# Patient Record
Sex: Female | Born: 1975 | Race: Black or African American | Hispanic: No | Smoking: Never smoker
Health system: Southern US, Community
[De-identification: ages and names within clinical notes are randomized; demographics above are authoritative.]

## PROBLEM LIST (undated history)

## (undated) DIAGNOSIS — I213 ST elevation (STEMI) myocardial infarction of unspecified site: Secondary | ICD-10-CM

## (undated) DIAGNOSIS — D573 Sickle-cell trait: Secondary | ICD-10-CM

## (undated) DIAGNOSIS — I10 Essential (primary) hypertension: Secondary | ICD-10-CM

## (undated) DIAGNOSIS — D649 Anemia, unspecified: Secondary | ICD-10-CM

## (undated) DIAGNOSIS — R569 Unspecified convulsions: Secondary | ICD-10-CM

## (undated) DIAGNOSIS — K219 Gastro-esophageal reflux disease without esophagitis: Secondary | ICD-10-CM

## (undated) DIAGNOSIS — E1165 Type 2 diabetes mellitus with hyperglycemia: Principal | ICD-10-CM

## (undated) DIAGNOSIS — IMO0001 Reserved for inherently not codable concepts without codable children: Secondary | ICD-10-CM

## (undated) DIAGNOSIS — E785 Hyperlipidemia, unspecified: Secondary | ICD-10-CM

## (undated) DIAGNOSIS — C541 Malignant neoplasm of endometrium: Secondary | ICD-10-CM

## (undated) DIAGNOSIS — F419 Anxiety disorder, unspecified: Secondary | ICD-10-CM

## (undated) DIAGNOSIS — E669 Obesity, unspecified: Secondary | ICD-10-CM

## (undated) HISTORY — DX: Sickle-cell trait: D57.3

## (undated) HISTORY — DX: Reserved for inherently not codable concepts without codable children: IMO0001

## (undated) HISTORY — DX: Type 2 diabetes mellitus with hyperglycemia: E11.65

## (undated) HISTORY — DX: Hyperlipidemia, unspecified: E78.5

## (undated) HISTORY — DX: Obesity, unspecified: E66.9

## (undated) HISTORY — DX: Anemia, unspecified: D64.9

## (undated) HISTORY — DX: Anxiety disorder, unspecified: F41.9

## (undated) HISTORY — DX: Essential (primary) hypertension: I10

## (undated) HISTORY — DX: ST elevation (STEMI) myocardial infarction of unspecified site: I21.3

---

## 2006-04-09 ENCOUNTER — Ambulatory Visit: Payer: Self-pay | Admitting: Family Medicine

## 2006-05-14 ENCOUNTER — Ambulatory Visit: Payer: Self-pay | Admitting: Family Medicine

## 2006-05-21 ENCOUNTER — Ambulatory Visit: Payer: Self-pay | Admitting: Family Medicine

## 2006-05-24 ENCOUNTER — Encounter: Admission: RE | Admit: 2006-05-24 | Discharge: 2006-05-24 | Payer: Self-pay | Admitting: Family Medicine

## 2006-06-11 ENCOUNTER — Ambulatory Visit: Payer: Self-pay | Admitting: Family Medicine

## 2006-06-12 ENCOUNTER — Encounter: Admission: RE | Admit: 2006-06-12 | Discharge: 2006-09-10 | Payer: Self-pay | Admitting: Family Medicine

## 2006-06-27 ENCOUNTER — Ambulatory Visit: Payer: Self-pay | Admitting: Cardiovascular Disease

## 2006-07-12 ENCOUNTER — Ambulatory Visit: Payer: Self-pay | Admitting: Family Medicine

## 2007-02-20 ENCOUNTER — Ambulatory Visit: Payer: Self-pay | Admitting: Family Medicine

## 2007-02-20 LAB — CONVERTED CEMR LAB
BUN: 10 mg/dL (ref 6–23)
Basophils Relative: 0 % (ref 0–1)
Chloride: 102 meq/L (ref 96–112)
Cholesterol: 138 mg/dL (ref 0–200)
Creatinine, Ser: 0.57 mg/dL (ref 0.40–1.20)
Glucose, Bld: 128 mg/dL — ABNORMAL HIGH (ref 70–99)
HCT: 37.7 % (ref 36.0–46.0)
Hemoglobin: 12.8 g/dL (ref 12.0–15.0)
LDL Cholesterol: 78 mg/dL (ref 0–99)
MCV: 73.9 fL — ABNORMAL LOW (ref 78.0–100.0)
Monocytes Absolute: 0.4 10*3/uL (ref 0.1–1.0)
Neutrophils Relative %: 64 % (ref 43–77)
Platelets: 353 10*3/uL (ref 150–400)
Sodium: 139 meq/L (ref 135–145)
TSH: 3.138 microintl units/mL (ref 0.350–5.50)
Total CHOL/HDL Ratio: 2.9
Total Protein: 8.1 g/dL (ref 6.0–8.3)
Triglycerides: 63 mg/dL (ref <150)
VLDL: 13 mg/dL (ref 0–40)

## 2007-02-21 ENCOUNTER — Ambulatory Visit: Payer: Self-pay | Admitting: *Deleted

## 2007-03-05 ENCOUNTER — Ambulatory Visit: Payer: Self-pay | Admitting: Family Medicine

## 2007-03-06 ENCOUNTER — Encounter (INDEPENDENT_AMBULATORY_CARE_PROVIDER_SITE_OTHER): Payer: Self-pay | Admitting: Family Medicine

## 2007-03-06 LAB — CONVERTED CEMR LAB: TSH: 1.936 microintl units/mL (ref 0.350–5.50)

## 2007-04-23 ENCOUNTER — Ambulatory Visit: Payer: Self-pay | Admitting: Family Medicine

## 2007-05-09 ENCOUNTER — Ambulatory Visit (HOSPITAL_COMMUNITY): Admission: RE | Admit: 2007-05-09 | Discharge: 2007-05-09 | Payer: Self-pay | Admitting: Family Medicine

## 2007-11-06 ENCOUNTER — Emergency Department (HOSPITAL_COMMUNITY): Admission: EM | Admit: 2007-11-06 | Discharge: 2007-11-06 | Payer: Self-pay | Admitting: Emergency Medicine

## 2007-11-19 ENCOUNTER — Ambulatory Visit: Payer: Self-pay | Admitting: Internal Medicine

## 2007-11-19 LAB — CONVERTED CEMR LAB
ALT: 15 units/L (ref 0–35)
Albumin: 3.9 g/dL (ref 3.5–5.2)
BUN: 8 mg/dL (ref 6–23)
CO2: 28 meq/L (ref 19–32)
Cholesterol: 137 mg/dL (ref 0–200)
Creatinine, Ser: 0.57 mg/dL (ref 0.40–1.20)
Glucose, Bld: 153 mg/dL — ABNORMAL HIGH (ref 70–99)
HDL: 40 mg/dL (ref >39)
Potassium: 4.5 meq/L (ref 3.5–5.3)
Total Bilirubin: 0.7 mg/dL (ref 0.3–1.2)
Total CHOL/HDL Ratio: 3.4
Total Protein: 8.1 g/dL (ref 6.0–8.3)
Triglycerides: 86 mg/dL (ref <150)
VLDL: 17 mg/dL (ref 0–40)

## 2007-11-22 ENCOUNTER — Ambulatory Visit: Payer: Self-pay | Admitting: Internal Medicine

## 2007-12-05 ENCOUNTER — Emergency Department (HOSPITAL_COMMUNITY): Admission: EM | Admit: 2007-12-05 | Discharge: 2007-12-05 | Payer: Self-pay | Admitting: Emergency Medicine

## 2007-12-10 ENCOUNTER — Ambulatory Visit: Payer: Self-pay | Admitting: Internal Medicine

## 2008-05-03 ENCOUNTER — Emergency Department (HOSPITAL_COMMUNITY): Admission: EM | Admit: 2008-05-03 | Discharge: 2008-05-04 | Payer: Self-pay | Admitting: Emergency Medicine

## 2008-05-04 ENCOUNTER — Emergency Department (HOSPITAL_COMMUNITY): Admission: EM | Admit: 2008-05-04 | Discharge: 2008-05-04 | Payer: Self-pay | Admitting: Emergency Medicine

## 2008-05-27 ENCOUNTER — Ambulatory Visit: Payer: Self-pay | Admitting: Family Medicine

## 2008-05-27 ENCOUNTER — Encounter (INDEPENDENT_AMBULATORY_CARE_PROVIDER_SITE_OTHER): Payer: Self-pay | Admitting: Internal Medicine

## 2008-05-27 LAB — CONVERTED CEMR LAB: Phenytoin Lvl: 0.9 ug/mL — ABNORMAL LOW (ref 10.0–20.0)

## 2008-05-28 ENCOUNTER — Encounter (INDEPENDENT_AMBULATORY_CARE_PROVIDER_SITE_OTHER): Payer: Self-pay | Admitting: Internal Medicine

## 2008-06-16 ENCOUNTER — Ambulatory Visit: Payer: Self-pay | Admitting: Internal Medicine

## 2008-06-16 ENCOUNTER — Encounter (INDEPENDENT_AMBULATORY_CARE_PROVIDER_SITE_OTHER): Payer: Self-pay | Admitting: Internal Medicine

## 2008-06-16 LAB — CONVERTED CEMR LAB: Phenytoin Lvl: 1 ug/mL — ABNORMAL LOW (ref 10.0–20.0)

## 2008-06-17 ENCOUNTER — Encounter (INDEPENDENT_AMBULATORY_CARE_PROVIDER_SITE_OTHER): Payer: Self-pay | Admitting: Internal Medicine

## 2008-06-30 ENCOUNTER — Ambulatory Visit: Payer: Self-pay | Admitting: Internal Medicine

## 2008-07-27 ENCOUNTER — Ambulatory Visit: Payer: Self-pay | Admitting: Internal Medicine

## 2008-08-17 ENCOUNTER — Emergency Department (HOSPITAL_COMMUNITY): Admission: EM | Admit: 2008-08-17 | Discharge: 2008-08-18 | Payer: Self-pay | Admitting: Emergency Medicine

## 2008-08-17 ENCOUNTER — Ambulatory Visit: Payer: Self-pay | Admitting: Internal Medicine

## 2008-08-21 ENCOUNTER — Ambulatory Visit: Payer: Self-pay | Admitting: Internal Medicine

## 2008-09-14 ENCOUNTER — Inpatient Hospital Stay (HOSPITAL_COMMUNITY): Admission: EM | Admit: 2008-09-14 | Discharge: 2008-09-18 | Payer: Self-pay | Admitting: Emergency Medicine

## 2008-09-15 ENCOUNTER — Encounter: Payer: Self-pay | Admitting: Neurology

## 2008-09-22 ENCOUNTER — Ambulatory Visit: Payer: Self-pay | Admitting: Internal Medicine

## 2008-10-19 DIAGNOSIS — F329 Major depressive disorder, single episode, unspecified: Secondary | ICD-10-CM

## 2008-10-19 DIAGNOSIS — E119 Type 2 diabetes mellitus without complications: Secondary | ICD-10-CM | POA: Insufficient documentation

## 2008-10-19 DIAGNOSIS — R569 Unspecified convulsions: Secondary | ICD-10-CM | POA: Insufficient documentation

## 2008-11-06 ENCOUNTER — Encounter (INDEPENDENT_AMBULATORY_CARE_PROVIDER_SITE_OTHER): Payer: Self-pay | Admitting: Internal Medicine

## 2008-11-06 ENCOUNTER — Ambulatory Visit: Payer: Self-pay | Admitting: Internal Medicine

## 2008-11-06 LAB — CONVERTED CEMR LAB
Basophils Relative: 1 % (ref 0–1)
Eosinophils Absolute: 0.3 10*3/uL (ref 0.0–0.7)
Lymphocytes Relative: 23 % (ref 12–46)
Lymphs Abs: 2 10*3/uL (ref 0.7–4.0)
MCHC: 34.7 g/dL (ref 30.0–36.0)
Monocytes Absolute: 0.5 10*3/uL (ref 0.1–1.0)
RDW: 14.1 % (ref 11.5–15.5)

## 2009-01-12 ENCOUNTER — Encounter (INDEPENDENT_AMBULATORY_CARE_PROVIDER_SITE_OTHER): Payer: Self-pay | Admitting: *Deleted

## 2009-01-24 ENCOUNTER — Emergency Department (HOSPITAL_COMMUNITY): Admission: EM | Admit: 2009-01-24 | Discharge: 2009-01-24 | Payer: Self-pay | Admitting: Emergency Medicine

## 2009-10-29 ENCOUNTER — Encounter: Admission: RE | Admit: 2009-10-29 | Discharge: 2009-10-29 | Payer: Self-pay | Admitting: Family Medicine

## 2010-02-17 ENCOUNTER — Emergency Department (HOSPITAL_COMMUNITY): Admission: EM | Admit: 2010-02-17 | Discharge: 2009-03-26 | Payer: Self-pay | Admitting: Emergency Medicine

## 2010-05-29 LAB — URINALYSIS, ROUTINE W REFLEX MICROSCOPIC
Bilirubin Urine: NEGATIVE
Leukocytes, UA: NEGATIVE
Protein, ur: NEGATIVE mg/dL
pH: 6 (ref 5.0–8.0)

## 2010-05-29 LAB — URINE MICROSCOPIC-ADD ON

## 2010-05-29 LAB — POCT I-STAT, CHEM 8
BUN: 6 mg/dL (ref 6–23)
Chloride: 104 mEq/L (ref 96–112)
HCT: 39 % (ref 36.0–46.0)

## 2010-05-29 LAB — CARBAMAZEPINE LEVEL, TOTAL: Carbamazepine Lvl: <2 ug/mL — ABNORMAL LOW (ref 4.0–12.0)

## 2010-06-15 LAB — GLUCOSE, CAPILLARY: Glucose-Capillary: 178 mg/dL — ABNORMAL HIGH (ref 70–99)

## 2010-06-15 LAB — DIFFERENTIAL
Basophils Absolute: 0 10*3/uL (ref 0.0–0.1)
Eosinophils Relative: 2 % (ref 0–5)
Lymphs Abs: 2.5 10*3/uL (ref 0.7–4.0)
Monocytes Absolute: 0.4 10*3/uL (ref 0.1–1.0)
Monocytes Relative: 3 % (ref 3–12)
Neutro Abs: 10.2 10*3/uL — ABNORMAL HIGH (ref 1.7–7.7)

## 2010-06-15 LAB — CBC
Hemoglobin: 13.3 g/dL (ref 12.0–15.0)
MCHC: 33.5 g/dL (ref 30.0–36.0)
MCV: 78.9 fL (ref 78.0–100.0)
Platelets: 374 10*3/uL (ref 150–400)
RDW: 14.8 % (ref 11.5–15.5)
WBC: 13.3 10*3/uL — ABNORMAL HIGH (ref 4.0–10.5)

## 2010-06-15 LAB — URINE CULTURE

## 2010-06-15 LAB — POCT I-STAT, CHEM 8
BUN: 8 mg/dL (ref 6–23)
Calcium, Ion: 1.12 mmol/L (ref 1.12–1.32)
Creatinine, Ser: 0.6 mg/dL (ref 0.4–1.2)
HCT: 42 % (ref 36.0–46.0)
Hemoglobin: 14.3 g/dL (ref 12.0–15.0)
Potassium: 3.6 mEq/L (ref 3.5–5.1)
TCO2: 27 mmol/L (ref 0–100)

## 2010-06-15 LAB — URINALYSIS, ROUTINE W REFLEX MICROSCOPIC
Bilirubin Urine: NEGATIVE
Urobilinogen, UA: 1 mg/dL (ref 0.0–1.0)

## 2010-06-15 LAB — URINE MICROSCOPIC-ADD ON

## 2010-06-19 LAB — GLUCOSE, CAPILLARY
Glucose-Capillary: 132 mg/dL — ABNORMAL HIGH (ref 70–99)
Glucose-Capillary: 165 mg/dL — ABNORMAL HIGH (ref 70–99)
Glucose-Capillary: 172 mg/dL — ABNORMAL HIGH (ref 70–99)
Glucose-Capillary: 182 mg/dL — ABNORMAL HIGH (ref 70–99)
Glucose-Capillary: 188 mg/dL — ABNORMAL HIGH (ref 70–99)
Glucose-Capillary: 74 mg/dL (ref 70–99)

## 2010-06-19 LAB — DIFFERENTIAL
Eosinophils Absolute: 0.3 10*3/uL (ref 0.0–0.7)
Eosinophils Relative: 3 % (ref 0–5)
Lymphocytes Relative: 24 % (ref 12–46)
Lymphs Abs: 2.3 10*3/uL (ref 0.7–4.0)
Monocytes Absolute: 0.9 10*3/uL (ref 0.1–1.0)

## 2010-06-19 LAB — COMPREHENSIVE METABOLIC PANEL
ALT: 10 U/L (ref 0–35)
ALT: 10 U/L (ref 0–35)
ALT: 11 U/L (ref 0–35)
AST: 11 U/L (ref 0–37)
AST: 15 U/L (ref 0–37)
Albumin: 3 g/dL — ABNORMAL LOW (ref 3.5–5.2)
Alkaline Phosphatase: 106 U/L (ref 39–117)
Alkaline Phosphatase: 122 U/L — ABNORMAL HIGH (ref 39–117)
BUN: 6 mg/dL (ref 6–23)
CO2: 28 mEq/L (ref 19–32)
CO2: 28 mEq/L (ref 19–32)
CO2: 30 mEq/L (ref 19–32)
Chloride: 104 mEq/L (ref 96–112)
Chloride: 99 mEq/L (ref 96–112)
Creatinine, Ser: 0.69 mg/dL (ref 0.4–1.2)
GFR calc Af Amer: >60 mL/min (ref >60)
GFR calc non Af Amer: >60 mL/min (ref >60)
GFR calc non Af Amer: >60 mL/min (ref >60)
GFR calc non Af Amer: >60 mL/min (ref >60)
Glucose, Bld: 118 mg/dL — ABNORMAL HIGH (ref 70–99)
Glucose, Bld: 244 mg/dL — ABNORMAL HIGH (ref 70–99)
Potassium: 3.8 mEq/L (ref 3.5–5.1)
Potassium: 4 mEq/L (ref 3.5–5.1)
Sodium: 133 mEq/L — ABNORMAL LOW (ref 135–145)
Sodium: 138 mEq/L (ref 135–145)
Sodium: 139 mEq/L (ref 135–145)
Total Bilirubin: 0.3 mg/dL (ref 0.3–1.2)
Total Bilirubin: 0.4 mg/dL (ref 0.3–1.2)
Total Protein: 7.1 g/dL (ref 6.0–8.3)
Total Protein: 7.1 g/dL (ref 6.0–8.3)

## 2010-06-19 LAB — CBC
HCT: 39.4 % (ref 36.0–46.0)
Hemoglobin: 12.6 g/dL (ref 12.0–15.0)
Hemoglobin: 13.2 g/dL (ref 12.0–15.0)
MCV: 80.6 fL (ref 78.0–100.0)
Platelets: 301 10*3/uL (ref 150–400)
RBC: 4.68 MIL/uL (ref 3.87–5.11)
RBC: 4.69 MIL/uL (ref 3.87–5.11)
RBC: 4.83 MIL/uL (ref 3.87–5.11)
RDW: 17.8 % — ABNORMAL HIGH (ref 11.5–15.5)
WBC: 10.5 10*3/uL (ref 4.0–10.5)
WBC: 9.6 10*3/uL (ref 4.0–10.5)

## 2010-06-19 LAB — VALPROIC ACID LEVEL
Valproic Acid Lvl: 121.4 ug/mL — ABNORMAL HIGH (ref 50.0–100.0)
Valproic Acid Lvl: 50.3 ug/mL (ref 50.0–100.0)
Valproic Acid Lvl: 58.9 ug/mL (ref 50.0–100.0)

## 2010-06-19 LAB — RAPID URINE DRUG SCREEN, HOSP PERFORMED
Amphetamines: NOT DETECTED
Benzodiazepines: NOT DETECTED
Tetrahydrocannabinol: NOT DETECTED

## 2010-06-19 LAB — CARBAMAZEPINE, FREE AND TOTAL
Carbamazepine, Free: 0.9 ug/mL — ABNORMAL LOW (ref 1.0–3.0)
Carbamazepine, Free: 2.5 ug/mL (ref 1.0–3.0)
Carbamazepine, Percent Free: 30.2 % (ref 8.0–35.0)
Carbamazepine, Total: 6.3 ug/mL (ref 4.0–12.0)

## 2010-06-19 LAB — CARBAMAZEPINE LEVEL, TOTAL: Carbamazepine Lvl: 7.6 ug/mL (ref 4.0–12.0)

## 2010-06-20 LAB — BASIC METABOLIC PANEL
Chloride: 105 mEq/L (ref 96–112)
GFR calc non Af Amer: >60 mL/min (ref >60)
Potassium: 4.3 mEq/L (ref 3.5–5.1)
Sodium: 142 mEq/L (ref 135–145)

## 2010-06-20 LAB — ETHANOL: Alcohol, Ethyl (B): <5 mg/dL (ref 0–10)

## 2010-06-20 LAB — RAPID URINE DRUG SCREEN, HOSP PERFORMED
Barbiturates: NOT DETECTED
Benzodiazepines: NOT DETECTED
Cocaine: NOT DETECTED
Opiates: NOT DETECTED

## 2010-06-20 LAB — PHENYTOIN LEVEL, TOTAL: Phenytoin Lvl: 6.7 ug/mL — ABNORMAL LOW (ref 10.0–20.0)

## 2010-06-28 LAB — DIFFERENTIAL
Eosinophils Relative: 1 % (ref 0–5)
Lymphocytes Relative: 22 % (ref 12–46)
Lymphs Abs: 2.7 10*3/uL (ref 0.7–4.0)
Monocytes Relative: 4 % (ref 3–12)

## 2010-06-28 LAB — URINALYSIS, ROUTINE W REFLEX MICROSCOPIC
Nitrite: NEGATIVE
Specific Gravity, Urine: 1.014 (ref 1.005–1.030)
Urobilinogen, UA: 0.2 mg/dL (ref 0.0–1.0)

## 2010-06-28 LAB — COMPREHENSIVE METABOLIC PANEL
ALT: 14 U/L (ref 0–35)
Calcium: 8.9 mg/dL (ref 8.4–10.5)
GFR calc Af Amer: >60 mL/min (ref >60)
Glucose, Bld: 125 mg/dL — ABNORMAL HIGH (ref 70–99)
Sodium: 138 mEq/L (ref 135–145)
Total Protein: 7.5 g/dL (ref 6.0–8.3)

## 2010-06-28 LAB — CBC
Hemoglobin: 11.8 g/dL — ABNORMAL LOW (ref 12.0–15.0)
MCHC: 33 g/dL (ref 30.0–36.0)
RDW: 15.9 % — ABNORMAL HIGH (ref 11.5–15.5)

## 2010-06-28 LAB — URINE MICROSCOPIC-ADD ON

## 2010-06-28 LAB — PHENYTOIN LEVEL, TOTAL: Phenytoin Lvl: <2.5 ug/mL — ABNORMAL LOW (ref 10.0–20.0)

## 2010-07-19 ENCOUNTER — Inpatient Hospital Stay (INDEPENDENT_AMBULATORY_CARE_PROVIDER_SITE_OTHER)
Admission: RE | Admit: 2010-07-19 | Discharge: 2010-07-19 | Disposition: A | Discharge status: Home / Self Care | Payer: Self-pay | Source: Ambulatory Visit | Attending: Family Medicine | Admitting: Family Medicine

## 2010-07-19 DIAGNOSIS — S058X9A Other injuries of unspecified eye and orbit, initial encounter: Secondary | ICD-10-CM

## 2010-07-26 NOTE — H&P (Signed)
NAMEMarland Kitchen  CORLISS, COGGESHALL NO.:  [id number]   MEDICAL RECORD NO.:  [id number]          PATIENT TYPE:  INP   LOCATION:  1437                         FACILITY:  [facility]   PHYSICIAN:  Noel Christmas, MD    DATE OF BIRTH:  September 21, 1975   DATE OF ADMISSION:  09/14/2008  DATE OF DISCHARGE:                              HISTORY & PHYSICAL   CHIEF COMPLAINT:  Recurrent grand mal seizures.   HISTORY OF PRESENT ILLNESS:  This is a 35 year old lady with a history  of chronic grand mal seizures who presented with a history of three  witnessed grand mal seizures today.  One of which lasted about 15  minutes.  Postictal state is described following  each her spells.  She  has had 6 generalized seizures over the past week.  Overall seizure  frequency has been at least several month.  She was in the emergency  room here on August 17, 2008, following seizure activity.  At that time she  was being weaned from Dilantin and titrated on Depakote.  She was  switched from Dilantin to Depakote because of continuous seizures on  Dilantin.  Unclear whether the Dilantin level was therapeutic.  She is  currently taking 500 mg of Depakote in the morning and 1000 mg at  bedtime.  The Depakote level on this presentation was 50.3 (normal  equals 50.0 to 100.0).  The patient has been afebrile.  Electrolytes  were unremarkable.  Blood counts were mostly unremarkable.   PAST MEDICAL HISTORY:  1. Is remarkable for diabetes mellitus.  2. Seizure disorder.  3. Depression.  4. Marked obesity.   CURRENT MEDICATIONS:  1. Metformin 500 mg t.i.d.  2. Glucotrol 5 mg per day.  3. Depakote 500 mg q.a.m. and 1000 mg nightly.  4. Celexa 20 mg per day.  5. Lexapro 20 mg per day.   FAMILY HISTORY:  Noncontributory.   ALLERGIES:  No known drug allergies except FLAGYL.   SOCIAL HISTORY:  The patient does not use tobacco products nor does she  drink alcohol.  There is no history of illicit drug use.   REVIEW OF  SYSTEMS:  The review of systems was negative except for the  patient's presenting complaint.   EXAMINATION:  VITAL SIGNS:  Temperature is 98.3, pulse 94, respirations  22 per minute, blood pressure 117/77, oxygen saturation is 96% on room  air.  GENERAL:  The appearance was that of a markedly obese young lady who was  alert, cooperative and in no acute distress.  Her mental status was  normal.  HEENT:  Were normal.  NECK:  Supple with no masses or tenderness.  CHEST:  Was clear to auscultation.  CARDIAC:  Rate and rhythm were normal.  Heart sounds were normal.  ABDOMEN:  Was soft and nontender.  The bowel sounds were normal.  EXTREMITIES:  Normal.  PERIPHERAL PULSES:  Were normal.  EXAMINATION OF GENITALIA AND RECTUM:  Deferred.  NEUROLOGICAL EXAM:  Pupils were equal and reactive only to light.  Extraocular movements and visual fields were normal.  There was no  facial weakness.  Hearing was normal.  Speech amount and palatal  movement were normal.  Strength and muscle tone were normal throughout.  Deep tendon reflexes were 1+ in the upper extremities and absent in the  lower extremities except 1+ left knee jerk area.  Plantar responses were  flexor.  Sensory examination was normal.  Coordination was normal.  Carotid auscultation was normal.   LABORATORY STUDIES:  WBC count was 9.6, hemoglobin 12.5, hematocrit  37.8, and platelet count was 301,000.  Serum sodium was 139, potassium  3.8, chloride 104, carbon dioxide 28, glucose 119, BUN 7, creatinine  0.69 and serum calcium was 8.8.  Urine drug screen was negative.  Pregnancy test was negative.   CLINICAL IMPRESSION:  1. Recurrent generalized seizure, most likely secondary to inadequate      anticonvulsive medication dosing.  2. 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  3. Marked obesity.   PLAN:  1. Depakote IV bolus, 500 mg.  2. Will increase maintenance Depakote to 1000 mg q.a.m. and nightly.  3. Repeat Depakote level in the  a.m.  4. I would recommend adding a second anticonvulsant medication if      seizure activity recurs and Depakote level is that or near upper      limits of normal.  5. Will admit overnight to telemetry bed.      Noel Christmas, MD  Electronically Signed     Noel Christmas, MD  Electronically Signed    CS/MEDQ  D:  09/14/2008  T:  09/14/2008  Job:  [id number]

## 2010-07-26 NOTE — Procedures (Signed)
EEG:  10 - 078.   CLINICAL HISTORY:  The patient is a 35 year old woman evaluated for the  presence of seizures versus nonepileptic seizures (780.39).   PROCEDURE:  The tracing is carried out on a 32-channel digital Cadwell  recorder reformatted into 16 channel montages with one devoted to EKG.  The patient was awake and asleep during the recording.  A total of 18-  hour 18.3 hours was recorded from 3:44 p.m. on July 7 to 10:05 a.m. on  July 8.   MEDICATIONS:  Include Tegretol, Lexapro, Glucotrol, Keppra, Glucophage,  Tylenol, and Ativan.   DESCRIPTION OF FINDINGS:  The dominant frequency is an 8 Hz 30 microvolt  activity that is well regulated.  Background activity shows mixed  frequency, alpha upper theta range activity, and frontally predominant  beta range components.   During the record, there were numerous episodes of seizure-like  behavior.  These will be described below.  All but 2 were associated  with push buttons.  The others were clear from evaluating the entire  record.  The patient was having seizure-like behavior.   The first occurred at 5:50-5:58 pm.  This is associated with rocking of  her head, flexing of her head and neck, moving her body side-to-side.  During this time, there is considerable muscle artifact particularly  when she girded her teeth.  Alpha range activity could be seen  underneath this.  Upon cessation of the behavior, alpha range activity  immediately returned.   The second episode happened between 6:50 and 6:54 p.m.  The patient  began by rubbing her left leg and sat up.  She seemed to be  unresponsive.  She then began rocking from side-to-side.  She then had  flailing behavior as she rocked back and forth from side to side.  Alpha  range activity could be seen from time to time and amidst significant  muscle artifact.   The next episode occurred between 9:52 and 9:57 p.m.  The patient was  eating and suddenly pushed her cart away.  She was  sitting up  unresponsive.  A man in the room lowered her bed down to flat.  She  seemed unresponsive.  She then began rolling from side-to-side and then  thrashing.  She also began grinding her teeth which caused significant  muscle artifact.  This too was associated with predominantly artifact  with some alpha range activity evident.   From 10:03 to 10:09 PM, the patient again had head rocking, pelvic  thrusting, and movement of her head from side-to-side.  The behavior  that was present was very similar to that seen described above.   At 3:35 in the morning, the patient had an episode that began with her  sitting up.  She had been asleep on 2 occasions from 9-9:50, 12:30-  01:46.  She has been resting quietly and then sat up.  At this time, the  background showed a rhythmic 4 Hz delta range activity.  It was broadly  distributed.  Once the lights returned on, it was apparent that the  patient was on her abdomen flexing and extending her trunk back and  forth.  This lasted from 3:35-3:39 and again as soon as the rocking  stopped, alpha range activity could be seen.   The final episode occurred around 9:30 a.m.  This lasted for about 4  minutes and again was associated with nodding of her head and rocking  back and forth.  She had 1 other episode between  5:55 and 5:59 that was  associated with flexing and extending her leg and moving her head from  side to side.  In both these cases, alpha range activity could be seen  in the background and then became much more prominent as soon as she  stopped the movement.   IMPRESSION:  The electroencephalogram shows a normal waking background  and also normal light natural sleep.  During the several episodes  described above, a variety of behaviors were seen all of them in  different sequences and form.  All of them were associated with muscle  artifact with alpha range activity that could either be seen during the  time of the behavior or  immediately in the aftermath as it stopped.  There was no interictal epileptiform activity in the form of spikes or  sharp waves.  There were no electrographic seizures.   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      Deanna Artis. Sharene Skeans, M.D.  Electronically Signed     ZOX:WRUE  D:  09/17/2008 20:19:10  T:  09/18/2008 04:13:16  Job #:  [id number]   cc:   Levert Feinstein, MD
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
## 2010-07-26 NOTE — Discharge Summary (Signed)
Kelly [name], Kelly [name]      ACCOUNT NO.:  [id number]   MEDICAL RECORD NO.:  [id number]          PATIENT TYPE:  INP   LOCATION:  3003                         FACILITY:  [facility]   PHYSICIAN:  Levert Feinstein, MD          DATE OF BIRTH:  [birth year]   DATE OF ADMISSION:  09/14/2008  DATE OF DISCHARGE:  09/18/2008                               DISCHARGE SUMMARY   DISCHARGE DIAGNOSIS:  Pseudoseizure.   CURRENT DIAGNOSIS:  Pseudoseizure.   OTHER DIAGNOSES:  1. Type 2 diabetes.  2. Morbid obesity.  3. Depression.   DISCHARGE MEDICATIONS:  1. Tegretol 200 mg twice a day.  2. Celexa 10 mg once every day.  3. Metformin 500 t.i.d.  4. Tylenol p.r.n.  5. Glipizide 5 mg p.o. daily.   HOSPITAL COURSE:  The patient is a 35 year old African American female,  admitted to [facility] ER on September 14, 2008 for recurrent seizure.  Initially, the patient and family described general tonic-clonic  seizure.  She had six seizures over the past week.  On the day of  admission, she had four.   The patient and family reported a history of seizure since 2000, was  initially treated with Dilantin with frequent occurrence over the past  few months, has weaned off Dilantin and titrated up on Depakote in the  past 1 month.  Upon admission, she was taking 500 in the morning and  1000 at evening time with level of 50.3 upon admission.  However, upon  further questioning, the patient reported a history of strong unpleasant  odor, seeing flashing light, proceeding almost each seizure, complex  partial seizure diagnosis was entertained.   During her hospital stay, she had frequent occurrence, up to 8 episodes  in a day.  There was also description of aversion to ammonia capsule,  arching back, and the lack of post-ictal confusion.  There was  difference semiology, such as body stiffness, mourning, repetitive  sentence during her seizure activity.  During multiple documented  episodes, her vitals were stable.   Pseudoseizure was considered, which  was later confirmed by Video EEG monitoring.   The patient was treated with Keppra temporally during her hospital stay  without improvement of her seizure-like activity, and therapeutic  Depakote, to the level 121, without improving her seizure like activity  either, both were tapered off.   She was also evaluated by Physical Therapy, because of her reported gait  difficulty, and the prescription of walker was provided.   In addition, she was evaluated by psychologist, Dr. Geralyn Flash, and  she already has an appointment with [facility] on  November 25, 2008.  She is also going to be followed up by her primary  care at [facility] on September 22, 2008 and her anti-depression  medication was changed from Lexapro to current Celexa.   I spent a lot of time explaining to the patient and her family the  above, multiple seizure-like events, not from abnormal discharge of her  brain, consistent with pseudoseizures.  Family and the patient voiced  understanding.   She is to discharge home and will follow  up with her primary care and  scheduled psychiatric mental health followup.      Levert Feinstein, MD  Electronically Signed     YY/MEDQ  D:  09/18/2008  T:  09/19/2008  Job:  [id number]

## 2010-07-26 NOTE — Procedures (Signed)
EEG IDENTIFICATION NUMBER:  [id number].   CLINICAL HISTORY:  A 35 year old patient being evaluated for possible  seizures.   MEDICATION LISTED:  Glucophage, Lexapro, Tegretol, Keppra, Tylenol, and  Ativan.   This is a portable EEG recorded with the patient awake and drowsy using  standard 10/20 electrode placement.   Background awake rhythm consists of 9-10 Hz alpha, which is admixed with  high-frequency, low-amplitude beta activity which is likely a medication  effect seen throughout the tracing.  No paroxysmal epileptiform  activity, spikes or sharp waves are noted.  Hyperventilation and photic  stimulation not performed.  Length of the recording is 21 minutes.  Technical component is suboptimal with excessive movement artifacts.  EKG tracing reveals regular sinus rhythm.   IMPRESSION:  This EEG performed during awake state is within normal  limits though suboptimal due to motion artifacts.  No definite  epileptiform features were noted.           ______________________________  Sunny Schlein. Pearlean Brownie, MD     ZOX:WRUE  D:  09/16/2008 17:14:11  T:  09/17/2008 04:48:07  Job #:  [id number]

## 2010-07-29 NOTE — Letter (Signed)
June 27, 2006    Sharlot Gowda, M.D.  Elpidio Galea, MD  [address]  [place], [place] [address]   RE:  Kelly, [name]  MRN:  [id number]  /  DOB:  [birth year]   Dear Kelly [name] and Dr. Susann Givens:   It was my pleasure to see Kelly [name] as an outpatient at the [facility]  Cardiology office on June 27, 2006.  As you know, she is a very nice 35-  year-old woman who presents here for evaluation of chest pain and  shortness of breath.   Kelly [name] reports a recent history of what she describes as panic  attacks.  These have occurred approximately once weekly and typically  occur at rest.  The most frequent time has been when she is driving to  work in the morning.  She describes sudden onset of shortness of breath  and palpitations.  There has been a minor component of chest pain, but  this is minimal.  Her main complaint is palpitations and feeling like  her heart is racing.  She also feels like she is suffocating.  The  episodes last approximately 7-10 minutes.  She was started on Lexapro in  March and her symptoms have diminished with episodes now occurring about  every 12 days.  She has no prior cardiac history.  She reports that she  walks every day with her dog and also exercises 30 minutes on a  treadmill and 30 minutes on an exercise bike.  She has been doing these  activities for approximately one month.  She has some dyspnea and  palpitations with these activities, but this is different than her  episodic resting symptoms.  She has had occasional lightheadedness but  has not had syncope.  She has no other acute complaints at this time.   CURRENT MEDICATIONS:  1. Metformin 500 mg twice daily.  2. Lexapro daily.  3. Xanax p.r.n.   ALLERGIES:  No known drug allergies.   PAST MEDICAL HISTORY:  1. Recently-diagnosed type 2 diabetes.  2. Anxiety.   The patient has had no surgeries or hospitalizations.  Overall, she has  been healthy.   FAMILY HISTORY:  Her mother is alive and  well at age 28 and has  hypertension.  Her father is alive and well at age 81, and she has one  sister who is age 23 and has hypertension.  There is no coronary artery  disease in the family.   SOCIAL HISTORY:  The patient works as a Occupational psychologist  for [employer].  She lives locally in [place], having moved from  [place].  She exercises as described above.  She does not smoke  cigarettes, drink alcohol or use illicit drugs.  She drinks three cups  of caffeinated soda daily.   REVIEW OF SYSTEMS:  A complete 12-point review of systems was performed.  Pertinent positives include headache, lightheadedness, shortness of  breath, dizziness, anxiety, menstrual dysfunction.  All other systems  were reviewed and are negative except as detailed above.   PHYSICAL EXAMINATION:  GENERAL:  The patient is alert and oriented.  She  is an obese African-American female in no acute distress.  VITAL SIGNS:  Her weight is 283 pounds.  Height is 5 feet 6 inches.  Blood pressure 112/80 in the right arm and 110/80 in the left arm.  Heart rate is 96.  Respiratory rate is 20.  HEENT:  Normal.  NECK:  Normal carotid upstrokes without bruits.  Jugular venous pressure  is normal.  No thyromegaly or thyroid nodules.  LUNGS:  Clear to auscultation bilaterally.  HEART: The apex is not palpable.  There is no right ventricular heave or  lift.  The heart is regular rate and rhythm without murmurs or gallops.  ABDOMEN:  Soft, obese, nontender, no organomegaly, no abdominal bruits.  EXTREMITIES:  No clubbing, cyanosis, or edema.  Peripheral pulses are 2+  and equal throughout.  SKIN:  Warm and dry without rash.  NEUROLOGIC:  Cranial nerves II-XII are intact.  Strength is 5/5 and  equal in the arms and legs bilaterally.  LYMPHATIC:  Normal without adenopathy.   EKG shows normal sinus rhythm and is within normal limits.   ASSESSMENT:  Kelly [name] is a 35 year old woman with predominant symptoms   of shortness of breath and palpitations.  I think most of her symptoms  are likely related to panic attacks.  This at least accounts for her  resting symptoms.  It sounds like she has had a significant improvement  since starting on Lexapro.  However, she does have dyspnea with some  palpitations occurring with exercise.  Some of this may be related to  her weight as well as deconditioning.  However, I think it would be  prudent to evaluate her with an exercise treadmill stress test to make  sure that she is not having any significant arrhythmias with exercise.  I think the pretest probability of significant obstructive coronary  artery disease is very low and does not warrant any noninvasive imaging  studies at this point.   I will plan on seeing Kelly [name] back at the time of her exercise  treadmill study, and we will see her in follow-up on an as-needed basis  or if any problems arise on her exercise treadmill study.   Thanks again for the opportunity to see this very nice lady.  Please  feel free to call at any time with questions regarding her care.    Sincerely,      Veverly Fells. Excell Seltzer, MD  Electronically Signed    MDC/MedQ  DD: 06/27/2006  DT: 06/27/2006  Job #: [id number]

## 2010-09-28 ENCOUNTER — Inpatient Hospital Stay (INDEPENDENT_AMBULATORY_CARE_PROVIDER_SITE_OTHER)
Admission: RE | Admit: 2010-09-28 | Discharge: 2010-09-28 | Disposition: A | Discharge status: Home / Self Care | Payer: Self-pay | Source: Ambulatory Visit | Attending: Family Medicine | Admitting: Family Medicine

## 2010-09-28 DIAGNOSIS — R6889 Other general symptoms and signs: Secondary | ICD-10-CM

## 2010-11-18 ENCOUNTER — Encounter: Payer: Self-pay | Admitting: Obstetrics & Gynecology

## 2012-04-02 ENCOUNTER — Encounter: Payer: Self-pay | Admitting: Medical

## 2012-04-02 ENCOUNTER — Ambulatory Visit (INDEPENDENT_AMBULATORY_CARE_PROVIDER_SITE_OTHER): Payer: No Typology Code available for payment source | Admitting: Medical

## 2012-04-02 VITALS — BP 120/80 | HR 72 | Temp 98.1°F | Resp 16 | Wt 271.0 lb

## 2012-04-02 DIAGNOSIS — R102 Pelvic and perineal pain: Secondary | ICD-10-CM

## 2012-04-02 DIAGNOSIS — E119 Type 2 diabetes mellitus without complications: Secondary | ICD-10-CM

## 2012-04-02 DIAGNOSIS — E669 Obesity, unspecified: Secondary | ICD-10-CM

## 2012-04-02 DIAGNOSIS — G40909 Epilepsy, unspecified, not intractable, without status epilepticus: Secondary | ICD-10-CM

## 2012-04-02 LAB — CBC WITH DIFFERENTIAL/PLATELET
Basophils Absolute: 0.1 10*3/uL (ref 0.0–0.1)
Basophils Relative: 0 % (ref 0–1)
Lymphocytes Relative: 33 % (ref 12–46)
MCHC: 32.5 g/dL (ref 30.0–36.0)
Neutro Abs: 6.8 10*3/uL (ref 1.7–7.7)
Platelets: 438 10*3/uL — ABNORMAL HIGH (ref 150–400)
RDW: 17.8 % — ABNORMAL HIGH (ref 11.5–15.5)
WBC: 11.4 10*3/uL — ABNORMAL HIGH (ref 4.0–10.5)

## 2012-04-02 LAB — COMPREHENSIVE METABOLIC PANEL
ALT: 10 U/L (ref 0–35)
Albumin: 3.5 g/dL (ref 3.5–5.2)
CO2: 29 mEq/L (ref 19–32)
Calcium: 9.1 mg/dL (ref 8.4–10.5)
Chloride: 101 mEq/L (ref 96–112)
Creat: 0.39 mg/dL — ABNORMAL LOW (ref 0.50–1.10)
Potassium: 3.7 mEq/L (ref 3.5–5.3)
Sodium: 138 mEq/L (ref 135–145)
Total Protein: 7.3 g/dL (ref 6.0–8.3)

## 2012-04-02 LAB — POCT URINALYSIS DIPSTICK
Ketones, UA: NEGATIVE
Leukocytes, UA: NEGATIVE
Nitrite, UA: NEGATIVE
Protein, UA: NEGATIVE
pH, UA: 5

## 2012-04-02 MED ORDER — DIVALPROEX SODIUM 500 MG PO DR TAB: 500.0000 mg | DELAYED_RELEASE_TABLET | Freq: Two times a day (BID) | ORAL | Status: DC

## 2012-04-02 NOTE — Progress Notes (Signed)
Subjective:  Kelly [name] is a 37 y.o. female who presents as a new patient.   Has been seen here prior in 2008, but went to [facility] a period [facility] time until they shut down.  She has been off medication and without routine care for over a year.  She wants to re-establish care and get her health matters under control.  Hx/o diabetes type II, was on metformin prior.  Not checking glucose, no current diabetes symptoms, but she has no idea what her blood sugars are running.  No recent labs.  Hx/o seizure disorder.  No seizure in 25mo.  Hx/o tonic clonic seizures.  Was last on Depakote, but has been on Keppra prior.   Has seen [facility] in the past.    She notes being on BP medication prior without diagnosis of hypertension.  Not sure why she was on this medication.  She reports benign married, no concern for STD, but she notes several year hx/o pelvic pain.  Has had prior eval including gynecology eval, ultrasound, and no cause has been determined. Gets pain in pelvic area at least 5 times weekly, worse after urinating, has visible blood in urine at least 5 times weekly, and periods are irregular in general.  No hx/o STD.  No other vaginal c/o or discharge.  No other aggravating or relieving factors.    No other c/o.  The following portions of the patient's history were reviewed and updated as appropriate: allergies, current medications, past family history, past medical history, past social history, past surgical history and problem list.  ROS Otherwise as in subjective above  Past Medical History  Diagnosis Date  . Diabetes mellitus without complication   . Obesity   . Seizure disorder     tonic clonic  . Anxiety   . Pelvic pain     Objective: Physical Exam  Vital signs reviewed  General appearance: alert, no distress, WD/WN, obese AA female Oral cavity: MMM, no lesions Neck: supple, no lymphadenopathy, no thyromegaly, no masses Heart: RRR, normal S1,  S2, no murmurs Lungs: CTA bilaterally, no wheezes, rhonchi, or rales Abdomen: +bs, soft, non tender, non distended, no masses, no hepatomegaly, no splenomegaly Pulses: 2+ radial pulses, 2+ pedal pulses, normal cap refill Ext: no edema Neuro: CN2-12 intact, DTRs normal, nonfocal exam  Assessment: Encounter Diagnoses  Name Primary?  . Type II or unspecified type diabetes mellitus without mention of complication, not stated as uncontrolled Yes  . Pelvic pain   . Seizure disorder   . Obesity      Plan: DM type II - labs today to get a baseline.  F/u pending labs.  Pelvic pain, chronic -  Advised that we can't fully evaluate this today, but we will at least send urine for culture.  She had trace hematuria on urinalysis, but given her history, I suspect an underlying genitourinary issue instead of a gynecological issue.  Return soon for pelvic exam, further evaluation.  Seizure disorder - discussed dangers of not being on medication.  Last seizure 8 months ago.  Last medication >45mo ago?  Restart Depakote, will refer back to neurology, advised she not stop the medication.    Obesity - need to work on diet improvements, regular exericse, weight loss measures.  Follow up:  Pending labs

## 2012-04-03 ENCOUNTER — Telehealth: Payer: Self-pay | Admitting: Family Medicine

## 2012-04-03 LAB — HEMOGLOBIN A1C: Mean Plasma Glucose: 249 mg/dL — ABNORMAL HIGH (ref <117)

## 2012-04-03 NOTE — Telephone Encounter (Signed)
I called the patient to inform her that I called over to [facility] to set her up an appointment Per Crosby Oyster PA-C and they states that they will not be able to schedule the appointment until she pays the balance that she has at that office. Patient is aware of this and she states that she will call the office and take care of the balance and call me back so I can refer her. CLS

## 2012-04-03 NOTE — Addendum Note (Signed)
Addended by: Janeice Robinson on: 04/03/2012 10:36 AM   Modules accepted: Orders

## 2012-04-03 NOTE — Telephone Encounter (Signed)
Message copied by Janeice Robinson on Wed Apr 03, 2012  2:04 PM ------      Message from: Jac Canavan      Created: Tue Apr 02, 2012  7:49 PM       Refer back to [facility] for recheck on seizure disorder

## 2012-04-06 LAB — URINE CULTURE

## 2012-04-07 ENCOUNTER — Other Ambulatory Visit: Payer: Self-pay | Admitting: Medical

## 2012-04-07 MED ORDER — METFORMIN HCL 500 MG PO TABS: 500.0000 mg | ORAL_TABLET | Freq: Two times a day (BID) | ORAL | Status: DC

## 2012-04-19 ENCOUNTER — Encounter: Payer: Self-pay | Admitting: Medical

## 2012-04-19 ENCOUNTER — Ambulatory Visit (INDEPENDENT_AMBULATORY_CARE_PROVIDER_SITE_OTHER): Payer: No Typology Code available for payment source | Admitting: Medical

## 2012-04-19 VITALS — BP 100/70 | HR 76 | Temp 98.7°F | Resp 16 | Wt 268.0 lb

## 2012-04-19 DIAGNOSIS — E669 Obesity, unspecified: Secondary | ICD-10-CM

## 2012-04-19 DIAGNOSIS — G40909 Epilepsy, unspecified, not intractable, without status epilepticus: Secondary | ICD-10-CM

## 2012-04-19 DIAGNOSIS — R7989 Other specified abnormal findings of blood chemistry: Secondary | ICD-10-CM

## 2012-04-19 DIAGNOSIS — D509 Iron deficiency anemia, unspecified: Secondary | ICD-10-CM

## 2012-04-19 MED ORDER — GLIPIZIDE 5 MG PO TABS: 5.0000 mg | ORAL_TABLET | Freq: Two times a day (BID) | ORAL | Status: DC

## 2012-04-19 MED ORDER — FERROUS SULFATE 325 (65 FE) MG PO TABS: 325.0000 mg | ORAL_TABLET | Freq: Every day | ORAL | Status: DC

## 2012-04-19 MED ORDER — METFORMIN HCL 1000 MG PO TABS: 1000.0000 mg | ORAL_TABLET | Freq: Two times a day (BID) | ORAL | Status: DC

## 2012-04-19 NOTE — Patient Instructions (Addendum)
For the next week, continue Metformin 500mg  twice daily.   After the next week, increase to 2 tablets of Metformin 500mg  twice daily.   Thus, you will be taking 1000mg  twice daily.  If this gives you a lot of loose stools, let me know.    Begin Glipizide 5mg  twice daily.  This medication may drop your glucose too low if you skip meals.   Thus, start checking your blood sugar with the glucometer fasting in the mornings and before meals.  If you see glucose numbers less than 60 before a meal, you need to eat something right away such as oragne juice and call and let me know about the low readings.  Signs of hypoglycemia can be sweats, shaking, dizzy, not feeling well.  I want you to start writing these numbers down and bring this log book back in 1 month.   Continue regular exercise.  Work on [employer] changes we discussed.   Try and limit to 1800 calories per day.   don't skip meals, but eat 3 meals daily.    Drink more water.   Begin ferrous sulfate once daily for iron.  You can continue the geritol.     Lets see you back in 1 month.    Diabetes Meal Planning Guide The diabetes meal planning guide is a tool to help you plan your meals and snacks. It is important for people with diabetes to manage their blood glucose (sugar) levels. Choosing the right foods and the right amounts throughout your day will help control your blood glucose. Eating right can even help you improve your blood pressure and reach or maintain a healthy weight.  CARBOHYDRATE COUNTING MADE EASY When you eat carbohydrates, they turn to sugar. This raises your blood glucose level. Counting carbohydrates can help you control this level so you feel better. When you plan your meals by counting carbohydrates, you can have more flexibility in what you eat and balance your medicine with your food intake.  Carbohydrate counting simply means adding up the total amount of carbohydrate grams in your meals and snacks. Try to eat about the  same amount at each meal. Foods with carbohydrates are listed below.   Each portion below is 1 carbohydrate serving or 15 grams of carbohydrates.    1800 Calorie Diet for Diabetes Meal Planning The 1800 calorie diet is designed for eating up to 1800 calories each day. Following this diet and making healthy meal choices can help improve overall health. This diet controls blood sugar (glucose) levels and can also help lower blood pressure and cholesterol.  SERVING SIZES Measuring foods and serving sizes helps to make sure you are getting the right amount of food. The list below tells how big or small some common serving sizes are:  1 oz.........4 stacked dice.  3 oz........Marland KitchenDeck of cards.  1 tsp.......Marland KitchenTip of little finger.  1 tbs......Marland KitchenMarland KitchenThumb.  2 tbs.......Marland KitchenGolf ball.   cup......Marland KitchenHalf of a fist.  1 cup.......Marland KitchenA fist.   GUIDELINES FOR CHOOSING FOODS The goal of this diet is to eat a variety of foods and limit calories to 1800 each day. This can be done by choosing foods that are low in calories and fat. The diet also suggests eating small amounts of food frequently. Doing this helps control your blood glucose levels so they do not get too high or too low. Each meal or snack may include a protein food source to help you feel more satisfied and to stabilize your blood glucose. Try  to eat about the same amount of food around the same time each day. This includes weekend days, travel days, and days off work. Space your meals about 4 to 5 hours apart and add a snack between them if you wish.  For example, a daily food plan could include breakfast, a morning snack, lunch, dinner, and an evening snack. Healthy meals and snacks include whole grains, vegetables, fruits, lean meats, poultry, fish, and dairy products. As you plan your meals, select a variety of foods. Choose from the bread and starch, vegetable, fruit, dairy, and meat/protein groups. Examples of foods from each group and their  suggested serving sizes are listed below. Use measuring cups and spoons to become familiar with what a healthy portion looks like. Bread and Starch Each serving equals 15 grams of carbohydrates.  1 slice bread.   bagel.   cup cold cereal (unsweetened).   cup hot cereal or mashed potatoes.  1 small potato (size of a computer mouse).   cup cooked pasta or rice.   English muffin.  1 cup broth-based soup.  3 cups of popcorn.  4 to 6 whole-wheat crackers.   cup cooked beans, peas, or corn. Vegetable Each serving equals 5 grams of carbohydrates.   cup cooked vegetables.  1 cup raw vegetables.   cup tomato or vegetable juice. Fruit Each serving equals 15 grams of carbohydrates.  1 small apple or orange.  1 cup watermelon or strawberries.   cup applesauce (no sugar added).  2 tbs raisins.   banana.   cup canned fruit, packed in water, its own juice, or sweetened with a sugar substitute.   cup unsweetened fruit juice. Dairy Each serving equals 12 to 15 grams of carbohydrates.  1 cup fat-free milk.  6 oz artificially sweetened yogurt or plain yogurt.  1 cup low-fat buttermilk.  1 cup soy milk.  1 cup almond milk. Meat/Protein  1 large egg.  2 to 3 oz meat, poultry, or fish.   cup low-fat cottage cheese.  1 tbs peanut butter.  1 oz low-fat cheese.   cup tuna in water.   cup tofu. Fat  1 tsp oil.  1 tsp trans-fat-free margarine.  1 tsp butter.  1 tsp mayonnaise.  2 tbs avocado.  1 tbs salad dressing.  1 tbs cream cheese.  2 tbs sour cream.  SAMPLE 1800 CALORIE DIET PLAN Breakfast   cup unsweetened cereal (1 carb serving).  1 cup fat-free milk (1 carb serving).  1 slice whole-wheat toast (1 carb serving).   small banana (1 carb serving).  1 scrambled egg.  1 tsp trans-fat-free margarine. Lunch  Tuna sandwich.  2 slices whole-wheat bread (2 carb servings).   cup canned tuna in water, drained.  1  tbs reduced fat mayonnaise.  1 stalk celery, chopped.  2 slices tomato.  1 lettuce leaf.  1 cup carrot sticks.  24 to 30 seedless grapes (2 carb servings).  6 oz light yogurt (1 carb serving). Afternoon Snack  3 graham cracker squares (1 carb serving).  Fat-free milk, 1 cup (1 carb serving).  1 tbs peanut butter. Dinner  3 oz salmon, broiled with 1 tsp oil.  1 cup mashed potatoes (2 carb servings) with 1 tsp trans-fat-free margarine.  1 cup fresh or frozen green beans.  1 cup steamed asparagus.  1 cup fat-free milk (1 carb serving). Evening Snack  3 cups air-popped popcorn (1 carb serving).  2 tbs parmesan cheese sprinkled on top.  MEAL PLAN Use  this worksheet to help you make a daily meal plan based on the 1800 calorie diet suggestions. If you are using this plan to help you control your blood glucose, you may interchange carbohydrate-containing foods (dairy, starches, and fruits). Select a variety of fresh foods of varying colors and flavors. The total amount of carbohydrate in your meals or snacks is more important than making sure you include all of the food groups every time you eat. Choose from the following foods to build your day's meals:  8 Starches.  4 Vegetables.  3 Fruits.  2 Dairy.  6 to 7 oz Meat/Protein.  Up to 4 Fats.  Your dietician can use this worksheet to help you decide how many servings and which types of foods are right for you. BREAKFAST Food Group and Servings / Food Choice Starch ________________________________________________________ Dairy _________________________________________________________ Fruit _________________________________________________________ Meat/Protein __________________________________________________ Fat ___________________________________________________________ LUNCH Food Group and Servings / Food Choice Starch ________________________________________________________ Meat/Protein  __________________________________________________ Vegetable _____________________________________________________ Fruit _________________________________________________________ Dairy _________________________________________________________ Fat ___________________________________________________________ Aura Fey Food Group and Servings / Food Choice Starch ________________________________________________________ Meat/Protein __________________________________________________ Fruit __________________________________________________________ Dairy _________________________________________________________ Laural Golden Food Group and Servings / Food Choice Starch _________________________________________________________ Meat/Protein ___________________________________________________ Dairy __________________________________________________________ Vegetable ______________________________________________________ Fruit ___________________________________________________________ Fat ____________________________________________________________ Lollie Sails Food Group and Servings / Food Choice Fruit __________________________________________________________ Meat/Protein ___________________________________________________ Dairy __________________________________________________________ Starch _________________________________________________________ DAILY TOTALS Starch ____________________________ Vegetable _________________________ Fruit _____________________________ Dairy _____________________________ Meat/Protein______________________ Fat _______________________________ Document Released: 09/19/2004 Document Revised: 05/22/2011 Document Reviewed: 01/13/2011 ExitCare Patient Information 2013 [place], [place].

## 2012-04-20 ENCOUNTER — Encounter: Payer: Self-pay | Admitting: Medical

## 2012-04-20 NOTE — Progress Notes (Signed)
Subjective:  Kelly [name] is a 37 y.o. female who presents for recheck.  i saw her recently as a new patient, and we did baseline labs.  She is here for f/u on the lab results and discussion.   Has been seen here prior in 2008, but went to [facility] a period of time until they shut down.  She has been off medication and without routine care for over a year.  She wants to re-establish care and get her health matters under control.  Hx/o diabetes type II, was on metformin prior.  Not checking glucose, no current diabetes symptoms, but she has no idea what her blood sugars are running.  No recent labs.  Hx/o seizure disorder.  No seizure in 19mo.  Hx/o tonic clonic seizures.  Was last on Depakote, but has been on Keppra prior.   Has seen [facility] in the past.  She has restarted depakote since last visit.  She has not gotten appt with neurology yet though.  She notes being on BP medication prior without diagnosis of hypertension.  Not sure why she was on this medication.  No hx/o anemia.  Periods lately have been regular but heavy the last several months.  Periods have been irregular in the past though.  No other bleeding, fatigue, weakness.  No other c/o.  The following portions of the patient's history were reviewed and updated as appropriate: allergies, current medications, past family history, past medical history, past social history, past surgical history and problem list.  ROS Otherwise as in subjective above  Past Medical History  Diagnosis Date  . Diabetes mellitus without complication   . Obesity   . Seizure disorder     tonic clonic  . Anxiety   . Pelvic pain     Objective: Physical Exam  Vital signs reviewed  General appearance: alert, no distress, WD/WN, obese AA female Heart: RRR, normal S1, S2, no murmurs Lungs: CTA bilaterally, no wheezes, rhonchi, or rales Abdomen: +bs, soft, non tender, non distended, no masses, no hepatomegaly, no  splenomegaly Pulses: 2+ radial pulses, 2+ pedal pulses, normal cap refill   Assessment: Encounter Diagnoses  Name Primary?  . Type II or unspecified type diabetes mellitus without mention of complication, uncontrolled Yes  . Microcytic anemia   . Seizure disorder   . Obese   . Elevated LFTs     Plan: DM type II - discussed findings, HgbA1C of 10.6%.  Discussed at length about diet, exercise, medications, risk of complications.  We will increase metformin to 1000mg  BID.  We will add Glipizide 5mg  BID.   I advised she cut out Pepsi altogether, cut way back on rice, breads, cut out sweets, and discussed plate portions.  Discussed hypoglycemia, symptoms, how to respond to this.  Gave handout.  We provided glucometer today, script for supplies, demo on proper use, and advised she keep log of glucose readings and recheck in 638mo.  microcytic anemia - likely due to heavy periods.  Begin ferrous sulfate.  Recheck 38mo.  At that time consider additional eval if still anemic.  Seizure disorder - c/t depakote BID, again reiterated the importance of getting in with neurology.  If unable to get appt within the next 3-4 wk, let me know and we will assist in different referral.  Obesity - need to work on diet improvements, regular exercise, weight loss measures.  Elevated LFTs - possible due to fatty liver disease.  Will recheck in a few months.  Follow up -  28mo

## 2013-03-27 ENCOUNTER — Encounter (HOSPITAL_COMMUNITY): Payer: Self-pay | Admitting: Emergency Medicine

## 2013-03-27 ENCOUNTER — Emergency Department (HOSPITAL_COMMUNITY): Payer: No Typology Code available for payment source

## 2013-03-27 ENCOUNTER — Emergency Department (HOSPITAL_COMMUNITY)
Admission: EM | Admit: 2013-03-27 | Discharge: 2013-03-28 | Disposition: A | Discharge status: Home / Self Care | Payer: No Typology Code available for payment source | Attending: Emergency Medicine | Admitting: Emergency Medicine

## 2013-03-27 DIAGNOSIS — Z8669 Personal history of other diseases of the nervous system and sense organs: Secondary | ICD-10-CM | POA: Insufficient documentation

## 2013-03-27 DIAGNOSIS — R1032 Left lower quadrant pain: Secondary | ICD-10-CM | POA: Insufficient documentation

## 2013-03-27 DIAGNOSIS — N7013 Chronic salpingitis and oophoritis: Secondary | ICD-10-CM | POA: Insufficient documentation

## 2013-03-27 DIAGNOSIS — Z79899 Other long term (current) drug therapy: Secondary | ICD-10-CM | POA: Insufficient documentation

## 2013-03-27 DIAGNOSIS — N739 Female pelvic inflammatory disease, unspecified: Secondary | ICD-10-CM | POA: Insufficient documentation

## 2013-03-27 DIAGNOSIS — A599 Trichomoniasis, unspecified: Secondary | ICD-10-CM | POA: Insufficient documentation

## 2013-03-27 DIAGNOSIS — N9489 Other specified conditions associated with female genital organs and menstrual cycle: Secondary | ICD-10-CM | POA: Insufficient documentation

## 2013-03-27 DIAGNOSIS — Z880 Allergy status to penicillin: Secondary | ICD-10-CM | POA: Insufficient documentation

## 2013-03-27 DIAGNOSIS — N858 Other specified noninflammatory disorders of uterus: Secondary | ICD-10-CM

## 2013-03-27 DIAGNOSIS — E119 Type 2 diabetes mellitus without complications: Secondary | ICD-10-CM | POA: Insufficient documentation

## 2013-03-27 DIAGNOSIS — Z792 Long term (current) use of antibiotics: Secondary | ICD-10-CM | POA: Insufficient documentation

## 2013-03-27 DIAGNOSIS — E669 Obesity, unspecified: Secondary | ICD-10-CM | POA: Insufficient documentation

## 2013-03-27 DIAGNOSIS — Z8659 Personal history of other mental and behavioral disorders: Secondary | ICD-10-CM | POA: Insufficient documentation

## 2013-03-27 DIAGNOSIS — N7011 Chronic salpingitis: Secondary | ICD-10-CM

## 2013-03-27 DIAGNOSIS — Z3202 Encounter for pregnancy test, result negative: Secondary | ICD-10-CM | POA: Insufficient documentation

## 2013-03-27 DIAGNOSIS — N730 Acute parametritis and pelvic cellulitis: Secondary | ICD-10-CM

## 2013-03-27 LAB — POCT I-STAT, CHEM 8
BUN: 4 mg/dL — AB (ref 6–23)
CALCIUM ION: 1.09 mmol/L — AB (ref 1.12–1.23)
CHLORIDE: 100 meq/L (ref 96–112)
Creatinine, Ser: 0.5 mg/dL (ref 0.50–1.10)
Glucose, Bld: 328 mg/dL — ABNORMAL HIGH (ref 70–99)
HEMATOCRIT: 32 % — AB (ref 36.0–46.0)
Hemoglobin: 10.9 g/dL — ABNORMAL LOW (ref 12.0–15.0)
POTASSIUM: 3.4 meq/L — AB (ref 3.7–5.3)
Sodium: 137 mEq/L (ref 137–147)
TCO2: 26 mmol/L (ref 0–100)

## 2013-03-27 LAB — URINALYSIS, ROUTINE W REFLEX MICROSCOPIC
Bilirubin Urine: NEGATIVE
Glucose, UA: 250 mg/dL — AB
Ketones, ur: 15 mg/dL — AB
NITRITE: NEGATIVE
Protein, ur: NEGATIVE mg/dL
SPECIFIC GRAVITY, URINE: 1.021 (ref 1.005–1.030)
UROBILINOGEN UA: 0.2 mg/dL (ref 0.0–1.0)
pH: 5.5 (ref 5.0–8.0)

## 2013-03-27 LAB — CBC WITH DIFFERENTIAL/PLATELET
BASOS PCT: 0 % (ref 0–1)
Basophils Absolute: 0 10*3/uL (ref 0.0–0.1)
EOS ABS: 0.5 10*3/uL (ref 0.0–0.7)
Eosinophils Relative: 3 % (ref 0–5)
HEMATOCRIT: 29.1 % — AB (ref 36.0–46.0)
HEMOGLOBIN: 9.4 g/dL — AB (ref 12.0–15.0)
Lymphocytes Relative: 22 % (ref 12–46)
Lymphs Abs: 3.4 10*3/uL (ref 0.7–4.0)
MCH: 19.6 pg — AB (ref 26.0–34.0)
MCHC: 32.3 g/dL (ref 30.0–36.0)
MCV: 60.8 fL — AB (ref 78.0–100.0)
MONO ABS: 1.1 10*3/uL — AB (ref 0.1–1.0)
Monocytes Relative: 7 % (ref 3–12)
NEUTROS ABS: 10.4 10*3/uL — AB (ref 1.7–7.7)
Neutrophils Relative %: 68 % (ref 43–77)
Platelets: 298 10*3/uL (ref 150–400)
RBC: 4.79 MIL/uL (ref 3.87–5.11)
RDW: 19.2 % — ABNORMAL HIGH (ref 11.5–15.5)
WBC: 15.4 10*3/uL — ABNORMAL HIGH (ref 4.0–10.5)

## 2013-03-27 LAB — URINE MICROSCOPIC-ADD ON

## 2013-03-27 LAB — WET PREP, GENITAL
Trich, Wet Prep: NONE SEEN
Yeast Wet Prep HPF POC: NONE SEEN

## 2013-03-27 LAB — POCT PREGNANCY, URINE: PREG TEST UR: NEGATIVE

## 2013-03-27 MED ORDER — POTASSIUM CHLORIDE CRYS ER 20 MEQ PO TBCR
40.0000 meq | EXTENDED_RELEASE_TABLET | Freq: Once | ORAL | Status: AC
  Administered 2013-03-27: 40 meq via ORAL
  Filled 2013-03-27: qty 2

## 2013-03-27 MED ORDER — IOHEXOL 300 MG/ML  SOLN
25.0000 mL | Freq: Once | INTRAMUSCULAR | Status: AC | PRN
  Administered 2013-03-27: 25 mL via ORAL

## 2013-03-27 MED ORDER — IOHEXOL 300 MG/ML  SOLN
100.0000 mL | Freq: Once | INTRAMUSCULAR | Status: AC | PRN
  Administered 2013-03-27: 100 mL via INTRAVENOUS

## 2013-03-27 MED ORDER — LEVOFLOXACIN 500 MG PO TABS
500.0000 mg | ORAL_TABLET | Freq: Once | ORAL | Status: AC
  Administered 2013-03-27: 500 mg via ORAL
  Filled 2013-03-27: qty 1

## 2013-03-27 NOTE — ED Provider Notes (Addendum)
CSN: [id number]     Arrival date & time 03/27/13  1431 History   First MD Initiated Contact with Patient 03/27/13 2042     Chief Complaint  Patient presents with  . Abdominal Pain  . Vaginal Discharge   (Consider location/radiation/quality/duration/timing/severity/associated sxs/prior Treatment) HPI Comments: Patient reports, that she's had irregular menstrual cycles throughout her whole life.  Her last normal menstrual cycle was in December where it was quite heavy, but short and since that, time.  She's had continuous bloody discharge is scant to moderate in amount.  The last 3, days.  She's had pelvic pain, and pressure denies any dysuria.  There is a possibility of pregnancy.  She uses no form of birth control  Patient is a 38 y.o. female presenting with abdominal pain and vaginal discharge. The history is provided by the patient.  Abdominal Pain Pain location:  Suprapubic, LLQ and RLQ Pain quality: aching and pressure   Pain severity:  Moderate Onset quality:  Gradual Duration:  4 weeks Timing:  Constant Progression:  Worsening Chronicity:  New Associated symptoms: vaginal discharge   Associated symptoms: no chills, no dysuria and no fever   Vaginal Discharge Associated symptoms: abdominal pain   Associated symptoms: no dysuria and no fever     Past Medical History  Diagnosis Date  . Diabetes mellitus without complication   . Obesity   . Seizure disorder     tonic clonic  . Anxiety   . Pelvic pain    History reviewed. No pertinent past surgical history. Family History  Problem Relation Age of Onset  . Depression Mother   . Hypertension Father   . Hypertension Sister   . Cancer Paternal Aunt     breast  . Heart disease Neg Hx   . Diabetes Sister     type 1 diabetes   History  Substance Use Topics  . Smoking status: Never Smoker   . Smokeless tobacco: Not on file  . Alcohol Use: No   OB History   Grav Para Term Preterm Abortions TAB SAB Ect Mult Living              Review of Systems  Constitutional: Negative for fever and chills.  Gastrointestinal: Positive for abdominal pain.  Genitourinary: Positive for vaginal discharge. Negative for dysuria, frequency and decreased urine volume.  All other systems reviewed and are negative.    Allergies  Flagyl and Amoxicillin  Home Medications   Current Outpatient Rx  Name  Route  Sig  Dispense  Refill  . metFORMIN (GLUCOPHAGE) 850 MG tablet   Oral   Take 850 mg by mouth 2 (two) times daily with a meal.         . clindamycin (CLEOCIN) 300 MG capsule   Oral   Take 1 capsule (300 mg total) by mouth 3 (three) times daily.   30 capsule   0   . HYDROcodone-acetaminophen (NORCO/VICODIN) 5-325 MG per tablet   Oral   Take 1 tablet by mouth every 6 (six) hours as needed.   10 tablet   0   . levofloxacin (LEVAQUIN) 500 MG tablet   Oral   Take 1 tablet (500 mg total) by mouth once.   13 tablet   0   . ondansetron (ZOFRAN) 4 MG tablet   Oral   Take 1 tablet (4 mg total) by mouth 4 (four) times daily.   12 tablet   0     For 24 hours than PRN  BP 138/81  Pulse 103  Temp(Src) 99.3 F (37.4 C) (Oral)  Resp 16  SpO2 98%  LMP 02/24/2013 Physical Exam  Nursing note and vitals reviewed. Constitutional: She is oriented to person, place, and time. She appears well-developed.  HENT:  Head: Normocephalic.  Eyes: Pupils are equal, round, and reactive to light.  Neck: Normal range of motion.  Cardiovascular: Normal rate.   Pulmonary/Chest: Effort normal.  Abdominal: Soft. She exhibits no distension. There is no tenderness.  Genitourinary: Vaginal discharge found.  Neurological: She is alert and oriented to person, place, and time.  Skin: Skin is warm.    ED Course  Procedures (including critical care time) Labs Review Labs Reviewed  WET PREP, GENITAL - Abnormal; Notable for the following:    Clue Cells Wet Prep HPF POC FEW (*)    WBC, Wet Prep HPF POC MODERATE (*)    All  other components within normal limits  URINALYSIS, ROUTINE W REFLEX MICROSCOPIC - Abnormal; Notable for the following:    Glucose, UA 250 (*)    Hgb urine dipstick MODERATE (*)    Ketones, ur 15 (*)    Leukocytes, UA MODERATE (*)    All other components within normal limits  CBC WITH DIFFERENTIAL - Abnormal; Notable for the following:    WBC 15.4 (*)    Hemoglobin 9.4 (*)    HCT 29.1 (*)    MCV 60.8 (*)    MCH 19.6 (*)    RDW 19.2 (*)    Neutro Abs 10.4 (*)    Monocytes Absolute 1.1 (*)    All other components within normal limits  POCT I-STAT, CHEM 8 - Abnormal; Notable for the following:    Potassium 3.4 (*)    BUN 4 (*)    Glucose, Bld 328 (*)    Calcium, Ion 1.09 (*)    Hemoglobin 10.9 (*)    HCT 32.0 (*)    All other components within normal limits  GC/CHLAMYDIA PROBE AMP  URINE MICROSCOPIC-ADD ON  POCT PREGNANCY, URINE   Imaging Review Ct Abdomen Pelvis W Contrast  03/27/2013   ADDENDUM REPORT: 03/27/2013 23:55  ADDENDUM: Results were telephoned to Dr. Ashok Cordia of the emergency department on 03/27/2013 at 2355 hr.   Electronically Signed   By: Evangeline Dakin M.D.   On: 03/27/2013 23:55   03/27/2013   CLINICAL DATA:  For day history of lower abdominal pain and pelvic pain.  EXAM: CT ABDOMEN AND PELVIS WITH CONTRAST  TECHNIQUE: Multidetector CT imaging of the abdomen and pelvis was performed using the standard protocol following bolus administration of intravenous contrast.  CONTRAST:  149mL OMNIPAQUE IOHEXOL 300 MG/ML IV. Oral contrast was also administered.  COMPARISON:  None.  FINDINGS: Large mass involving the endometrium, with extension to just above the expected location of the internal cervical os, maximum measurements approximating 12.5 x 8.0 x 5.2 cm. Fluid-filled tubular structure in the left adnexae, consistent with hydrosalpinx. Right ovary immediately adjacent to the right lateral fundal wall, containing cysts. Left ovary adjacent to the left lateral uterine body,  normal in appearance. No free pelvic fluid.  Normal appearing liver, spleen, pancreas, gallbladder, and kidneys. Gallbladder mildly contracted but normal in appearance. No biliary ductal dilation. No visible aortoiliofemoral atherosclerosis. No significant lymphadenopathy.  Normal appearing stomach and small bowel. Sigmoid colon diverticulosis without evidence of acute diverticulitis. Remainder the colon normal in appearance. Normal appendix in the right upper pelvis, just anterior to the right psoas muscle. No ascites. Small umbilical hernia  containing fat.  Urinary bladder unremarkable. Bone window images unremarkable. Visualized lung bases clear. Heart size normal.  IMPRESSION: 1. Large mass involving the endometrium with involvement of the entire uterus extending to a level at or just above the expected level of the internal os of the cervix. Measurements are given above. 2. Hydrosalpinx involving the left fallopian tube. No evidence of right hydrosalpinx. 3. Normal-appearing ovaries by CT. 4. No evidence of metastatic disease in the abdomen or pelvis. 5. Sigmoid colon diverticulosis without evidence of acute diverticulitis. Please note that I do not believe further imaging with ultrasound would add additional information to that on the CT. If further imaging is felt necessary, a nonemergent MRI of the pelvis with and without contrast would be suggested.  Electronically Signed: By: Evangeline Dakin M.D. On: 03/27/2013 23:46    EKG Interpretation   None       MDM   1. Hydrosalpinx   2. Uterine mass   3. Trichomonas infection   4. PID (acute pelvic inflammatory disease)     To patient's allergy to Flagyl.  I consulted with pharmacy for appropriate antibiotic treatment Per patient's exam she has PID.  CT is pending.  I started Levaquin as the antibiotic of choice due to  Penicillin .  Allergy I discussed at length.  Flagyl for this patient.  Minutes we decided to use 8 mg of Zofran weight one  hour and administer the full dose of 2 g Flagyl.  While patient is in the emergency room and can be observed. I've also discussed.  The CT scan results with the gynecologist on call, who agrees with Levaquin, and clindamycin, and office followup if patient can tolerate these antibiotics by mouth. I discussed this plan with the patient and she is agreeable. Patient was able to tolerate the by mouth Flagyl, without any complications, nausea, or vomiting.  She's been discharge, with prescriptions for clindamycin and Levaquin, to take on regular, basis for the next 10 days, as well as Zofran to take a regular basis for the next 24 hours and then as needed.  Thereafter.  She has been instructed to [facility].  She develops new or worsening symptoms.  Otherwise, make an appointment for followup  Garald Balding, NP 03/28/13 1696  Garald Balding, NP 04/16/13 2103

## 2013-03-27 NOTE — ED Notes (Signed)
Pt reports having lower abd, lower back pain and pelvic pain x 3 days. Having vaginal discharge with bloody tinge to it. Denies any urinary symptoms.

## 2013-03-27 NOTE — ED Notes (Signed)
Returned from CT.

## 2013-03-27 NOTE — ED Notes (Signed)
Pt reports lower abdominal pain with "pinkish" discharge x 2-3 days; has not had active bleeding.  Pressure in peri area, rates 7/10.  Unsure if pregnant; has had unprotected sex in past week.

## 2013-03-27 NOTE — ED Notes (Signed)
Patient transported to CT 

## 2013-03-28 ENCOUNTER — Telehealth (HOSPITAL_COMMUNITY): Payer: Self-pay

## 2013-03-28 LAB — GC/CHLAMYDIA PROBE AMP
CT PROBE, AMP APTIMA: NEGATIVE
GC Probe RNA: NEGATIVE

## 2013-03-28 MED ORDER — ONDANSETRON HCL 4 MG PO TABS
4.0000 mg | ORAL_TABLET | Freq: Four times a day (QID) | ORAL | Status: DC
Start: 2013-03-28 — End: 2013-04-14

## 2013-03-28 MED ORDER — METRONIDAZOLE 500 MG PO TABS
2000.0000 mg | ORAL_TABLET | Freq: Once | ORAL | Status: AC
  Administered 2013-03-28: 2000 mg via ORAL
  Filled 2013-03-28: qty 4

## 2013-03-28 MED ORDER — ONDANSETRON HCL 4 MG/2ML IJ SOLN
8.0000 mg | Freq: Once | INTRAMUSCULAR | Status: AC
  Administered 2013-03-28: 8 mg via INTRAVENOUS
  Filled 2013-03-28: qty 4

## 2013-03-28 MED ORDER — CLINDAMYCIN HCL 300 MG PO CAPS: 300.0000 mg | ORAL_CAPSULE | Freq: Three times a day (TID) | ORAL | Status: DC

## 2013-03-28 MED ORDER — CLINDAMYCIN HCL 300 MG PO CAPS
300.0000 mg | ORAL_CAPSULE | Freq: Once | ORAL | Status: AC
  Administered 2013-03-28: 300 mg via ORAL
  Filled 2013-03-28: qty 1

## 2013-03-28 MED ORDER — HYDROCODONE-ACETAMINOPHEN 5-325 MG PO TABS: 1.0000 | ORAL_TABLET | Freq: Four times a day (QID) | ORAL | Status: DC | PRN

## 2013-03-28 MED ORDER — LEVOFLOXACIN 500 MG PO TABS: 500.0000 mg | ORAL_TABLET | Freq: Once | ORAL | Status: DC

## 2013-03-28 MED ORDER — MORPHINE SULFATE 4 MG/ML IJ SOLN
4.0000 mg | Freq: Once | INTRAMUSCULAR | Status: AC
  Administered 2013-03-28: 4 mg via INTRAVENOUS
  Filled 2013-03-28: qty 1

## 2013-03-28 NOTE — ED Provider Notes (Signed)
Medical screening examination/treatment/procedure(s) were performed by non-physician practitioner and as supervising physician I was immediately available for consultation/collaboration.  EKG Interpretation   None         Mirna Mires, MD 03/28/13 1328

## 2013-03-28 NOTE — ED Notes (Signed)
Plan of care per NP. Pt has had reaction to flagyl previously. Pt reaction is copious amounts of vomiting and rash. NP wants 8mg  of Zofran then an hour later for patient to get flagyl. NP then wants patient to stay after flagyl administration to monitor for allergic reaction.

## 2013-03-28 NOTE — Discharge Instructions (Signed)
As discussed during her ED visit.  You have inflammation of your fallopian tube, more on the left than the right, you also had inflammation and infection within her uterus.  U. been given 2 different antibiotics, that should treat the infection.  I would like you to call [facility] in the morning to set an appointment for followup, you were treated in the emergency department for Trichomonas, and we'll not need any further antibiotic. Due to the  reaction to that particular antibiotic I would like you to take the Zofran on a regular basis for 24 hours, then as needed.  Thereafter If you develop new or worsening symptoms.  Fever, or unable to tolerate her antibiotics.  I would like you to go directly to [facility] for further treatment and most likely admission

## 2013-03-28 NOTE — ED Notes (Signed)
Pt given 2 sandwiches, peanut butter crackers and sprite.

## 2013-03-28 NOTE — ED Notes (Signed)
No untoward affects s/p flagyl PO.  Pt comfortable, watching tv.

## 2013-03-28 NOTE — ED Notes (Signed)
Call from [place] for [place] instruction clarification.  Instructions read take 1 tablet once.  # 13.  Dr Rogene Houston consulted and should read take 1 tablet daily # 13. (Pt had 1 dose in ED) Information given to RPh.

## 2013-03-28 NOTE — ED Notes (Signed)
Pt informed about delay of CT results.

## 2013-04-03 ENCOUNTER — Other Ambulatory Visit (HOSPITAL_COMMUNITY)
Admission: RE | Admit: 2013-04-03 | Discharge: 2013-04-03 | Disposition: A | Discharge status: Home / Self Care | Payer: No Typology Code available for payment source | Source: Ambulatory Visit | Attending: Obstetrics & Gynecology | Admitting: Obstetrics & Gynecology

## 2013-04-03 ENCOUNTER — Encounter: Payer: Self-pay | Admitting: Obstetrics & Gynecology

## 2013-04-03 ENCOUNTER — Ambulatory Visit (INDEPENDENT_AMBULATORY_CARE_PROVIDER_SITE_OTHER): Payer: No Typology Code available for payment source | Admitting: Obstetrics & Gynecology

## 2013-04-03 VITALS — BP 126/84 | HR 116 | Temp 98.8°F | Ht 66.0 in | Wt 258.5 lb

## 2013-04-03 DIAGNOSIS — N858 Other specified noninflammatory disorders of uterus: Secondary | ICD-10-CM | POA: Insufficient documentation

## 2013-04-03 DIAGNOSIS — N8502 Endometrial intraepithelial neoplasia [EIN]: Secondary | ICD-10-CM | POA: Insufficient documentation

## 2013-04-03 DIAGNOSIS — N9489 Other specified conditions associated with female genital organs and menstrual cycle: Secondary | ICD-10-CM

## 2013-04-03 DIAGNOSIS — E119 Type 2 diabetes mellitus without complications: Secondary | ICD-10-CM

## 2013-04-03 DIAGNOSIS — Z01419 Encounter for gynecological examination (general) (routine) without abnormal findings: Secondary | ICD-10-CM

## 2013-04-03 LAB — POCT PREGNANCY, URINE: Preg Test, Ur: NEGATIVE

## 2013-04-03 MED ORDER — MELOXICAM 7.5 MG PO TABS: 7.5000 mg | ORAL_TABLET | Freq: Two times a day (BID) | ORAL | Status: DC

## 2013-04-03 NOTE — Progress Notes (Signed)
CLINIC ENCOUNTER NOTE  History:  38 y.o. G0P0000 here today for discussion of management of large uterine mass seen on recent imaging after presenting for pain.  She still reports significant pelvic  pain and pink discharge, no heavy bleeding.  She reports having "female problems' for several years.    The following portions of the patient's history were reviewed and updated as appropriate: allergies, current medications, past family history, past medical history, past social history, past surgical history and problem list. Normal pap over a year ago.  Review of Systems:  Pertinent items are noted in HPI.  Objective:  Physical Exam BP 126/84  Pulse 116  Temp(Src) 98.8 F (37.1 C)  Ht 5\' 6"  (1.676 m)  Wt 258 lb 8 oz (117.255 kg)  BMI 41.74 kg/m2  LMP 02/24/2013 GENERAL: Well-developed, obese female in no acute distress.  BREASTS: Large, symmetric in size. No masses, skin changes, nipple drainage, or lymphadenopathy.  ABDOMEN: Soft, obese, mild lower abdominal tenderness,, nondistended. No organomegaly palpated. PELVIC: Normal external female genitalia. Vagina is pink and rugated. Brown vaginal discharge seen. Normal cervix contour. Pap smear obtained. Unable to palpate uterus or adnexa secondary to habitus  EXTREMITIES: No cyanosis, clubbing, or edema, 2+ distal pulses.  ENDOMETRIAL BIOPSY     The indications for endometrial biopsy were reviewed.   Risks of the biopsy including cramping, bleeding, infection, uterine perforation, inadequate specimen and need for additional procedures  were discussed. The patient states she understands and agrees to undergo procedure today. Consent was signed. Time out was performed. Urine HCG was negative. During the pelvic exam, the cervix was prepped with Betadine. A single-toothed tenaculum was placed on the anterior lip of the cervix to stabilize it. The 3 mm pipelle was introduced into the endometrial cavity without difficulty to a depth of 5 cm,  unable to pass this point because of obstruction from mass,  and a small amount of tissue was obtained and sent to pathology. The instruments were removed from the patient's vagina. Minimal bleeding from the cervix was noted. The patient tolerated the procedure well. Routine post-procedure instructions were given to the patient.    Labs and Imaging 03/27/2013  CT ABDOMEN AND PELVIS WITH CONTRAST CLINICAL DATA:  For day history of lower abdominal pain and pelvic pain.      COMPARISON:  None.  FINDINGS: Large mass involving the endometrium, with extension to just above the expected location of the internal cervical os, maximum measurements approximating 12.5 x 8.0 x 5.2 cm. Fluid-filled tubular structure in the left adnexae, consistent with hydrosalpinx. Right ovary immediately adjacent to the right lateral fundal wall, containing cysts. Left ovary adjacent to the left lateral uterine body, normal in appearance. No free pelvic fluid.  Normal appearing liver, spleen, pancreas, gallbladder, and kidneys. Gallbladder mildly contracted but normal in appearance. No biliary ductal dilation. No visible aortoiliofemoral atherosclerosis. No significant lymphadenopathy.  Normal appearing stomach and small bowel. Sigmoid colon diverticulosis without evidence of acute diverticulitis. Remainder the colon normal in appearance. Normal appendix in the right upper pelvis, just anterior to the right psoas muscle. No ascites. Small umbilical hernia containing fat.  Urinary bladder unremarkable. Bone window images unremarkable. Visualized lung bases clear. Heart size normal.  IMPRESSION: 1. Large mass involving the endometrium with involvement of the entire uterus extending to a level at or just above the expected level of the internal os of the cervix. Measurements are given above. 2. Hydrosalpinx involving the left fallopian tube. No evidence of right hydrosalpinx.  3. Normal-appearing ovaries by CT. 4. No evidence of metastatic  disease in the abdomen or pelvis. 5. Sigmoid colon diverticulosis without evidence of acute diverticulitis. Please note that I do not believe further imaging with ultrasound would add additional information to that on the CT. If further imaging is felt necessary, a nonemergent MRI of the pelvis with and without contrast would be suggested.  Electronically Signed: By: Evangeline Dakin M.D. On: 03/27/2013 23:46    Assessment & Plan:  Will follow up pap smear and endometrial biopsy and manage accordingly.  Discussed management options for this large uterine mass, patient desires definitive management with hysterectomy.  I proposed doing a robotic assisted total hysterectomy (RATH) and prophylactic bilateral salpingectomy.   Patient agrees with this proposed surgery.  The risks of surgery were discussed in detail with the patient including but not limited to: bleeding which may require transfusion or reoperation; infection which may require antibiotics; injury to bowel, bladder, ureters or other surrounding organs; need for additional procedures including laparotomy; thromboembolic phenomenon, incisional problems and other postoperative/anesthesia complications.  Patient was also advised that she will likely go home same day; and expected recovery time after a hysterectomy is 4-6 weeks.  Likelihood of success in alleviating the patient's symptoms was discussed.  Routine postoperative instructions will be reviewed with the patient and her family in detail after surgery.  She was told that she will be contacted by our surgical scheduler regarding the time and date of her surgery; routine preoperative instructions of having nothing to eat or drink after midnight on the day prior to surgery and also coming to the hospital 1 1/2 hours prior to her time of surgery were also emphasized.  She was told she may be called for a preoperative appointment about a week prior to surgery and will be given further preoperative  instructions at that visit. In the meantime, she was given Meloxicam prn pain; pain precautions were reviewed. Printed patient education handouts about the procedure was given to the patient to review at home.   Verita Schneiders, MD, [place] Attending [place], [place]

## 2013-04-03 NOTE — Patient Instructions (Signed)
Hysterectomy Information  A hysterectomy is a surgery in which your uterus is removed. This surgery may be done to treat various medical problems. After the surgery, you will no longer have menstrual periods. The surgery will also make you unable to become pregnant (sterile). The fallopian tubes and ovaries can be removed (bilateral salpingo-oophorectomy) during this surgery as well.  REASONS FOR A HYSTERECTOMY  Persistent, abnormal bleeding.  Lasting (chronic) pelvic pain or infection.  The lining of the uterus (endometrium) starts growing outside the uterus (endometriosis).  The endometrium starts growing in the muscle of the uterus (adenomyosis).  The uterus falls down into the vagina (pelvic organ prolapse).  Noncancerous growths in the uterus (uterine fibroids) that cause symptoms.  Precancerous cells.  Cervical cancer or uterine cancer. TYPES OF HYSTERECTOMIES  Supracervical hysterectomy In this type, the top part of the uterus is removed, but not the cervix.  Total hysterectomy The uterus and cervix are removed.  Radical hysterectomy The uterus, the cervix, and the fibrous tissue that holds the uterus in place in the pelvis (parametrium) are removed. WAYS A HYSTERECTOMY CAN BE PERFORMED  Abdominal hysterectomy A large surgical cut (incision) is made in the abdomen. The uterus is removed through this incision.  Vaginal hysterectomy An incision is made in the vagina. The uterus is removed through this incision. There are no abdominal incisions.  Conventional laparoscopic hysterectomy Three or four small incisions are made in the abdomen. A thin, lighted tube with a camera (laparoscope) is inserted into one of the incisions. Other tools are put through the other incisions. The uterus is cut into small pieces. The small pieces are removed through the incisions, or they are removed through the vagina.  Laparoscopically assisted vaginal hysterectomy (LAVH) Three or four small  incisions are made in the abdomen. Part of the surgery is performed laparoscopically and part vaginally. The uterus is removed through the vagina.  Robot-assisted laparoscopic hysterectomy A laparoscope and other tools are inserted into 3 or 4 small incisions in the abdomen. A computer-controlled device is used to give the surgeon a 3D image and to help control the surgical instruments. This allows for more precise movements of surgical instruments. The uterus is cut into small pieces and removed through the incisions or removed through the vagina. RISKS AND COMPLICATIONS  Possible complications associated with this procedure include:  Bleeding and risk of blood transfusion. Tell your health care provider if you do not want to receive any blood products.  Blood clots in the legs or lung.  Infection.  Injury to surrounding organs.  Problems or side effects related to anesthesia.  Conversion to an abdominal hysterectomy from one of the other techniques. WHAT TO EXPECT AFTER A HYSTERECTOMY  You will be given pain medicine.  You will need to have someone with you for the first 3 5 days after you go home.  You will need to follow up with your surgeon in 2 4 weeks after surgery to evaluate your progress.  You may have early menopause symptoms such as hot flashes, night sweats, and insomnia.  If you had a hysterectomy for a problem that was not cancer or not a condition that could lead to cancer, then you no longer need Pap tests. However, even if you no longer need a Pap test, a regular exam is a good idea to make sure no other problems are starting. Document Released: 08/23/2000 Document Revised: 12/18/2012 Document Reviewed: 11/04/2012 [facility] Patient Information 2014 [place].

## 2013-04-08 ENCOUNTER — Encounter: Payer: Self-pay | Admitting: Obstetrics & Gynecology

## 2013-04-08 ENCOUNTER — Telehealth: Payer: Self-pay

## 2013-04-08 DIAGNOSIS — R87610 Atypical squamous cells of undetermined significance on cytologic smear of cervix (ASC-US): Secondary | ICD-10-CM | POA: Insufficient documentation

## 2013-04-08 NOTE — Telephone Encounter (Signed)
Pt. Called stating she is just following up-- she was told she would have to have a hysterectomy and would like to know the next steps.

## 2013-04-10 ENCOUNTER — Encounter: Payer: Self-pay | Admitting: Obstetrics & Gynecology

## 2013-04-10 ENCOUNTER — Encounter: Payer: Self-pay | Admitting: *Deleted

## 2013-04-10 NOTE — Telephone Encounter (Signed)
Called pt. No answer. Left message saying we are returning your call, call clinic.

## 2013-04-10 NOTE — Telephone Encounter (Signed)
Patients surgery has been scheduled she will be contacted by schedulers.

## 2013-04-14 ENCOUNTER — Encounter (HOSPITAL_COMMUNITY): Payer: Self-pay

## 2013-04-14 ENCOUNTER — Encounter (HOSPITAL_COMMUNITY): Payer: Self-pay | Admitting: Pharmacist

## 2013-04-14 ENCOUNTER — Telehealth: Payer: Self-pay | Admitting: Obstetrics & Gynecology

## 2013-04-14 ENCOUNTER — Encounter (HOSPITAL_COMMUNITY)
Admission: RE | Admit: 2013-04-14 | Discharge: 2013-04-14 | Disposition: A | Discharge status: Home / Self Care | Payer: No Typology Code available for payment source | Source: Ambulatory Visit | Attending: Obstetrics & Gynecology | Admitting: Obstetrics & Gynecology

## 2013-04-14 DIAGNOSIS — E1165 Type 2 diabetes mellitus with hyperglycemia: Secondary | ICD-10-CM

## 2013-04-14 DIAGNOSIS — IMO0002 Reserved for concepts with insufficient information to code with codable children: Secondary | ICD-10-CM

## 2013-04-14 DIAGNOSIS — Z01812 Encounter for preprocedural laboratory examination: Secondary | ICD-10-CM | POA: Insufficient documentation

## 2013-04-14 HISTORY — DX: Gastro-esophageal reflux disease without esophagitis: K21.9

## 2013-04-14 HISTORY — DX: Unspecified convulsions: R56.9

## 2013-04-14 LAB — CBC
HCT: 30.5 % — ABNORMAL LOW (ref 36.0–46.0)
HEMOGLOBIN: 9.9 g/dL — AB (ref 12.0–15.0)
MCH: 19.3 pg — AB (ref 26.0–34.0)
MCHC: 32.5 g/dL (ref 30.0–36.0)
MCV: 59.3 fL — ABNORMAL LOW (ref 78.0–100.0)
Platelets: 432 10*3/uL — ABNORMAL HIGH (ref 150–400)
RBC: 5.14 MIL/uL — ABNORMAL HIGH (ref 3.87–5.11)
RDW: 19.4 % — ABNORMAL HIGH (ref 11.5–15.5)
WBC: 9.6 10*3/uL (ref 4.0–10.5)

## 2013-04-14 LAB — BASIC METABOLIC PANEL
BUN: 7 mg/dL (ref 6–23)
CO2: 25 meq/L (ref 19–32)
CREATININE: 0.43 mg/dL — AB (ref 0.50–1.10)
Calcium: 8.4 mg/dL (ref 8.4–10.5)
Chloride: 100 mEq/L (ref 96–112)
GFR calc Af Amer: >90 mL/min (ref >90)
GLUCOSE: 271 mg/dL — AB (ref 70–99)
Potassium: 4.1 mEq/L (ref 3.7–5.3)
Sodium: 135 mEq/L — ABNORMAL LOW (ref 137–147)

## 2013-04-14 MED ORDER — METFORMIN HCL 1000 MG PO TABS: 1000.0000 mg | ORAL_TABLET | Freq: Two times a day (BID) | ORAL | Status: DC

## 2013-04-14 MED ORDER — GLIPIZIDE 10 MG PO TABS: 10.0000 mg | ORAL_TABLET | Freq: Every day | ORAL | Status: DC

## 2013-04-14 NOTE — Pre-Procedure Instructions (Signed)
Informed Dr. Ihor Dow of patient's elevated glucose of271.  Dr Ihor Dow will call patient and prescribe her a new medication to help lower her glucose level for surgery on 04/18/13.

## 2013-04-14 NOTE — Telephone Encounter (Signed)
Pt called back.  I informed her of her pathology report.  I also informed  her that her [place] was elevated and if it remained so, her surgery could potentially be cancelled.  She was STRONGLY encourged to increase her Glucophage to 1000mg  bid (she was worried about cost so was told that she could take 875bid).  She was also prescribed Glucotrol 10mg  per day to start TODAY.  I explained to her the importance of this surgery given her pathology.  She states that she will get the meds today and start them today.  All of her questions were answered.  Lejla Moeser L. Harraway-Smith, M.D., [facility]

## 2013-04-14 NOTE — Patient Instructions (Addendum)
   Your procedure is scheduled on: Friday, Feb 6  Enter through the [employer] [facility] at: 6 AM Pick up the phone at the desk and dial [phone] and inform us of your arrival.  Please call this number if you have any problems the morning of surgery: [phone]  Remember: Do not eat or drink after midnight: Thursday Take these medicines the morning of surgery with a SIP OF WATER:  None.  Patient instructed to withhold Thursday night Metformin dose.  Do not wear jewelry, make-up, or FINGER nail polish No metal in your hair or on your body. Do not wear lotions, powders, perfumes.  You may wear deodorant.  Do not bring valuables to the hospital. Contacts, dentures or bridgework may not be worn into surgery.  Leave suitcase in the car. After Surgery it may be brought to your room. For patients being admitted to the hospital, checkout time is 11:00am the day of discharge.  Home with fiance Kelly [name]

## 2013-04-14 NOTE — Telephone Encounter (Signed)
Attempted to call pt.  Left message to call us back ASAP.  Jakyla Reza L. Harraway-Smith, M.D., [facility]

## 2013-04-14 NOTE — Pre-Procedure Instructions (Signed)
Informed Dr Primitivo Gauze that patient's glucose level at today's PAT appt. Is 271.  Verbal order from Dr Primitivo Gauze to call patient to let her know that her glucose level is high and is at risk for having her surgery cancelled on the day of surgery.  Patient instructed to follow up with PCP to get her glucose level under control.  Marni at Dr Vladimir Creeks office also informed.

## 2013-04-14 NOTE — Telephone Encounter (Signed)
Dr Ihor Dow spoke to patient

## 2013-04-18 ENCOUNTER — Encounter (HOSPITAL_COMMUNITY): Admission: RE | Payer: Self-pay | Source: Ambulatory Visit

## 2013-04-18 ENCOUNTER — Ambulatory Visit (HOSPITAL_COMMUNITY)
Admission: RE | Admit: 2013-04-18 | Payer: No Typology Code available for payment source | Source: Ambulatory Visit | Admitting: Obstetrics & Gynecology

## 2013-04-18 ENCOUNTER — Telehealth: Payer: Self-pay | Admitting: *Deleted

## 2013-04-18 ENCOUNTER — Other Ambulatory Visit: Payer: Self-pay | Admitting: *Deleted

## 2013-04-18 SURGERY — ROBOTIC ASSISTED TOTAL HYSTERECTOMY
Anesthesia: Choice | Site: Abdomen

## 2013-04-18 NOTE — Telephone Encounter (Signed)
Called patient and informed her of her appointment with Dr. [name] on Feb 18th at 3:15 pm. She was informed to arrive 30 minutes early. Pt had no further questions.

## 2013-04-20 NOTE — ED Provider Notes (Signed)
Medical screening examination/treatment/procedure(s) were performed by non-physician practitioner and as supervising physician I was immediately available for consultation/collaboration.  EKG Interpretation   None         Mirna Mires, MD 04/20/13 [phone]

## 2013-04-29 ENCOUNTER — Telehealth: Payer: Self-pay | Admitting: *Deleted

## 2013-04-29 NOTE — Telephone Encounter (Signed)
Call to pt confirmed appt on 2/18.  

## 2013-04-30 ENCOUNTER — Encounter: Payer: Self-pay | Admitting: Gynecologic Oncology

## 2013-04-30 ENCOUNTER — Ambulatory Visit: Payer: No Typology Code available for payment source | Attending: Gynecologic Oncology | Admitting: Gynecologic Oncology

## 2013-04-30 VITALS — BP 125/98 | HR 99 | Temp 98.5°F | Resp 20 | Ht 66.34 in | Wt 259.5 lb

## 2013-04-30 DIAGNOSIS — N858 Other specified noninflammatory disorders of uterus: Secondary | ICD-10-CM

## 2013-04-30 DIAGNOSIS — Z79899 Other long term (current) drug therapy: Secondary | ICD-10-CM | POA: Insufficient documentation

## 2013-04-30 DIAGNOSIS — K5730 Diverticulosis of large intestine without perforation or abscess without bleeding: Secondary | ICD-10-CM | POA: Insufficient documentation

## 2013-04-30 DIAGNOSIS — E669 Obesity, unspecified: Secondary | ICD-10-CM | POA: Insufficient documentation

## 2013-04-30 DIAGNOSIS — N8500 Endometrial hyperplasia, unspecified: Secondary | ICD-10-CM | POA: Insufficient documentation

## 2013-04-30 DIAGNOSIS — N7013 Chronic salpingitis and oophoritis: Secondary | ICD-10-CM | POA: Insufficient documentation

## 2013-04-30 DIAGNOSIS — E119 Type 2 diabetes mellitus without complications: Secondary | ICD-10-CM | POA: Insufficient documentation

## 2013-04-30 DIAGNOSIS — K219 Gastro-esophageal reflux disease without esophagitis: Secondary | ICD-10-CM | POA: Insufficient documentation

## 2013-04-30 NOTE — Progress Notes (Signed)
Consult Note: Gyn-Onc  Kelly [name] 38 y.o. female  CC: No chief complaint on file.   HPI: Patient is seen today in consultation at the request of Dr. Ihor Dow.  Patient is a 38 year old gravida 0 para 0 with a long history of infertility, irregular bleeding and ovarian cyst rupture is. She states that she's had pelvic pain since 2000. She is moved a great deal and is a difficulty establishing gynecologic care. Her pain is primarily that of throbbing pain and without any significant bleeding. She's always had irregular cycles with the exception of when she's been number of control pills. She has gone anywhere from not bleeding for an entire year to bleeding for an entire year. Her last normal cycle was 02/25/2013. Recently she had an episode of heavy bleeding and pain and was seen at the emergency room. On 03/27/2013 showed a CT scan of the abdomen and pelvis that revealed:  FINDINGS:  Large mass involving the endometrium, with extension to just above the expected location of the internal cervical os, maximum measurements approximating 12.5 x 8.0 x 5.2 cm. Fluid-filled tubular structure in the left adnexae, consistent with hydrosalpinx. Right ovary immediately adjacent to the right lateral fundal wall, containing cysts. Left ovary adjacent to the left lateral uterine body, normal in appearance. No free pelvic fluid. Normal appearing liver, spleen, pancreas, gallbladder, and kidneys. Gallbladder mildly contracted but normal in appearance. No biliary ductal dilation. No visible aortoiliofemoral atherosclerosis. No significant lymphadenopathy.  Normal appearing stomach and small bowel. Sigmoid colon diverticulosis without evidence of acute diverticulitis. Remainder the colon normal in appearance. Normal appendix in the right upper pelvis, just anterior to the right psoas muscle. No ascites. Small umbilical hernia containing fat. Urinary bladder unremarkable. Bone window images  unremarkable. Visualized lung bases clear. Heart size normal.  IMPRESSION:  1. Large mass involving the endometrium with involvement of the entire uterus extending to a level at or just above the expected level of the internal os of the cervix. Measurements are given above.  2. Hydrosalpinx involving the left fallopian tube. No evidence of right hydrosalpinx.  3. Normal-appearing ovaries by CT.  4. No evidence of metastatic disease in the abdomen or pelvis.  5. Sigmoid colon diverticulosis without evidence of acute diverticulitis.    She had a Pap smear January 22 that revealed atypical squamous cells with negative high risk HPV. 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 Microscopic Comment There are several intact endometrial fragments with cribriform, crowded and irregular glands consistent with atleast complex atypical hyperplasia. There are also numerous detached papillary fragments with morphologicfeatures consistent with papillary syncytial metaplasia. Immunohistochemistry is performed and there is diffuse strong estrogen receptor positivity, p53 as well as p16. CEA shows patchy positivity and ki-67 showslow to moderate activity. 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  Dr. Ihor Dow was considering performing her surgery and have any review of her imaging and biopsy results. Was concerning to me due to the papillary features as well as p53 and p16 expression that we could be dealing with a malignancy. Therefore we felt that she needed referral to gynecologic oncology. Her OB/GYN graciously agreed.  She does have a family history for cancer. Her father paternal aunt had breast cancer. His sister had breast cancer. In  addition to have his first cousins had breast cancer. The patient states  they're all in their late 96s to early 45s. She's not aware that they had any genetic testing. She has had elevated blood sugars. January 15 the blood sugar was 328. On February 2 was 271.  She does not check her blood sugars at home.  She was recently adopted a 2-monthold daughter.  Review of Systems  Constitutional: Denies fever.+ 37# intentional weight loss in 6 months Skin: No rash, sores, jaundice, itching, or dryness.  Cardiovascular: No chest pain, shortness of breath, or edema  Pulmonary: No cough or wheeze.  Gastro Intestinal: Reporting intermittent lower abdominal soreness.  No nausea, vomiting, constipation, or diarrhea reported. No bright red blood per rectum or change in bowel movement. + Diarrhea from glipizide. Genitourinary: No frequency, urgency, or dysuria.  Denies vaginal bleeding.+ discharge. She was treated for trichomonas but feels that she did not completely clear it. Musculoskeletal: No myalgia, arthralgia, joint swelling or pain.  Neurologic: No weakness, numbness, or change in gait.  Psychology: No changes   Current Meds:  Outpatient Encounter Prescriptions as of 04/30/2013  Medication Sig  . ferrous sulfate 325 (65 FE) MG tablet Take 325 mg by mouth daily with breakfast.  . glipiZIDE (GLUCOTROL) 10 MG tablet Take 1 tablet (10 mg total) by mouth daily before breakfast.  . meloxicam (MOBIC) 7.5 MG tablet Take 7.5 mg by mouth daily.  . metFORMIN (GLUCOPHAGE) 1000 MG tablet Take 1 tablet (1,000 mg total) by mouth 2 (two) times daily with a meal.  . Multiple Vitamins-Minerals (MULTIVITAMIN WITH MINERALS) tablet Take 1 tablet by mouth daily.  . Naproxen Sodium 220 MG CAPS Take 220 mg by mouth daily as needed (pain).  .Marland KitchenOVER THE COUNTER MEDICATION Take 2 tablets by mouth daily. Hairfinity vitamin    Allergy:  Allergies  Allergen Reactions  . Amoxicillin Nausea And Vomiting, Swelling and Rash     Rash, vomiting, throat closing  . Flagyl [Metronidazole] Nausea And Vomiting    04/30/13-Per pt she is unable to tolerate this medication, causes rash  . Adhesive [Tape] Rash    "takes skin off"    Social Hx:   History   Social History  . Marital Status: Widowed    Spouse Name: N/A    Number of Children: N/A  . Years of Education: N/A   Occupational History  . Not on file.   Social History Main Topics  . Smoking status: Never Smoker   . Smokeless tobacco: Never Used  . Alcohol Use: No  . Drug Use: No  . Sexual Activity: Yes    Birth Control/ Protection: None   Other Topics Concern  . Not on file   Social History Narrative  . No narrative on file    Past Surgical Hx: History reviewed. No pertinent past surgical history.  Past Medical Hx:  Past Medical History  Diagnosis Date  . Diabetes mellitus without complication   . Obesity   . Seizure disorder     tonic clonic  . Pelvic pain   . Anxiety     no meds  . GERD (gastroesophageal reflux disease)     occasional no meds   . Seizures     trigger by strong odors, last seizure 1 yr ago, no meds    Oncology Hx:   No history exists.    Family Hx:  Family History  Problem Relation Age of Onset  . Depression Mother   . Hypertension Father   . Hypertension Sister   . Cancer Paternal Aunt  breast  . Heart disease Neg Hx   . Diabetes Sister     type 1 diabetes    Vitals:  Blood pressure 125/98, pulse 99, temperature 98.5 F (36.9 C), temperature source Oral, resp. rate 20, height 5' 6.34" (1.685 m), weight 259 lb 8 oz (117.708 kg).  Physical Exam: Well-nourished well-developed female in no acute distress.  Neck: Supple, no lymphadenopathy, no thyromegaly.  Lungs: Clear to auscultation bilaterally.  Cardiovascular: Regular rate and rhythm.  Abdomen: Morbidly obese. Soft, nontender, nondistended. There are no palpable masses or hepatosplenomegaly. Exam is limited by habitus.  Groins: No  lymphadenopathy.  Extremities: No edema.  Pelvic: Normal external female genitalia. Bimanual examination is a small nulliparous cervix. There is a mass posteriorly measuring approximate 8 cm opposed cul-de-sac. There is no nodularity. There are no adnexal masses. Exam is limited by habitus  Assessment/Plan: 38 year old with a large 12 cm mass involving the endometrium. Endometrial biopsy revealed complex hyperplasia with atypia in a concerning pattern as it is P. 16 and P. 53 positive. We discussed definitive surgery. We will tentatively robotic hysterectomy. The mass be sent for frozen section with the uterus. It may need to be morcellated within a spleen bag placed to the vagina. If the mass represents cancer depending on the depth of invasion we may or may not proceed with a bilateral salpingo-oophorectomy. If the mass does not reveal cancer she would like to at least keep one ovary. We discussed the pros and cons of retention of the ovary. Initially today she wished to have both of her tubes and ovaries removed, but after discussion she would like to keep at least one ovary if they appear unremarkable. She understands that we will proceed with lymphadenectomy based on the results of the frozen section and salipingectomy.  Based on available data here in [place] she would like to have her surgery performed as soon as possible and she scheduled for surgery at [facility] on March 6. She is a preoperative visit scheduled next week on February 26 at 2:30.  Her questions were elicited in answer to her satisfaction. She has my card and will call me if she has any questions prior to her preoperative visit.  Nancy Marus A., MD 04/30/2013, 3:33 PM

## 2013-04-30 NOTE — Patient Instructions (Signed)
Total Laparoscopic Hysterectomy, Care After Refer to this sheet in the next few weeks. These instructions provide you with information on caring for yourself after your procedure. Your health care provider may also give you more specific instructions. Your treatment has been planned according to current medical practices, but problems sometimes occur. Call your health care provider if you have any problems or questions after your procedure. WHAT TO EXPECT AFTER THE PROCEDURE  Pain and bruising at the incision sites. You will be given pain medicine to control it.  Menopausal symptoms such as hot flashes, night sweats, and insomnia if your ovaries were removed.  Sore throat from the breathing tube that was inserted during surgery. HOME CARE INSTRUCTIONS  Only take over-the-counter or prescription medicines for pain, discomfort, or fever as directed by your health care provider.   Do not take aspirin. It can cause bleeding.   Do not drive when taking pain medicine.   Follow your health care provider's advice regarding diet, exercise, lifting, driving, and general activities.   Resume your usual diet as directed and allowed.   Get plenty of rest and sleep.   Do not douche, use tampons, or have sexual intercourse for at least 6 weeks, or until your health care provider gives you permission.   Change your bandages (dressings) as directed by your health care provider.   Monitor your temperature and notify your health care provider of a fever.   Take showers instead of baths for 2 3 weeks.   Do not drink alcohol until your health care provider gives you permission.   If you develop constipation, you may take a mild laxative with your health care provider's permission. Bran foods may help with constipation problems. Drinking enough fluids to keep your urine clear or pale yellow may help as well.   Try to have someone home with you for 1 2 weeks to help around the house.    Keep all of your follow-up appointments as directed by your health care provider.  SEEK MEDICAL CARE IF:  You have swelling, redness, or increasing pain around your incision sites.   You have pus coming from your incision.   You notice a bad smell coming from your incision.   Your incision breaks open.   You feel dizzy or lightheaded.   You have pain or bleeding when you urinate.   You have persistent diarrhea.   You have persistent nausea and vomiting.   You have abnormal vaginal discharge.   You have a rash.   You have any type of abnormal reaction or develop an allergy to your medicine.   You have poor pain control with your prescribed medicine.  SEEK IMMEDIATE MEDICAL CARE IF:  You have chest pain or shortness of breath.  You have severe abdominal pain that is not relieved with pain medicine.  You have pain or swelling in your legs. Document Released: 12/18/2012 Document Reviewed: 09/17/2012 [facility] Patient Information 2014 [place], [place]. Total Laparoscopic Hysterectomy A total laparoscopic hysterectomy is a minimally invasive surgery to remove your uterus and cervix. This surgery is performed by making several small cuts (incisions) in your abdomen. It can also be done with a thin, lighted tube (laparoscope) inserted into two small incisions in your lower abdomen. Your fallopian tubes and ovaries can be removed (bilateral salpingo-oophorectomy) during this surgery as well.Benefits of minimally invasive surgery include:  Less pain.  Less risk of blood loss.  Less risk of infection.  Quicker return to normal activities. LET YOUR  HEALTH CARE PROVIDER KNOW ABOUT:  Any allergies you have.  All medicines you are taking, including vitamins, herbs, eye drops, creams, and over-the-counter medicines.  Previous problems you or members of your family have had with the use of anesthetics.  Any blood disorders you have.  Previous surgeries you have  had.  Medical conditions you have. RISKS AND COMPLICATIONS  Generally, this is a safe procedure. However, as with any procedure, complications can occur. Possible complications include:  Bleeding.  Blood clots in the legs or lung.  Infection.  Injury to surrounding organs.  Problems with anesthesia.  Early menopause symptoms (hot flashes, night sweats, insomnia).  Risk of conversion to an open abdominal incision. BEFORE THE PROCEDURE  Ask your health care provider about changing or stopping your regular medicines.  Do not take aspirin or blood thinners (anticoagulants) for 1 week before the surgery or as told by your health care provider.  Do not eat or drink anything for 8 hours before the surgery or as told by your health care provider.  Quit smoking if you smoke.  Arrange for a ride home after surgery and for someone to help you at home during recovery. PROCEDURE   You will be given antibiotic medicine.  An IV tube will be placed in your arm. You will be given medicine to make you sleep (general anesthetic).  A gas (carbon dioxide) will be used to inflate your abdomen. This will allow your surgeon to look inside your abdomen, perform your surgery, and treat any other problems found if necessary.  Three or four small incisions (often less than 1/2 inch) will be made in your abdomen. One of these incisions will be made in the area of your belly button (navel). The laparoscope will be inserted into the incision. Your surgeon will look through the laparoscope while doing your procedure.  Other surgical instruments will be inserted through the other incisions.  Your uterus may be removed through your vagina or cut into small pieces and removed through the small incisions.  Your incisions will be closed. AFTER THE PROCEDURE  The gas will be released from inside your abdomen.  You will be taken to the recovery area where a nurse will watch and check your progress. Once  you are awake, stable, and taking fluids well, without other problems, you will return to your room or be allowed to go home.  There is usually minimal discomfort following the surgery because the incisions are so small.  You will be given pain medicine while you are in the hospital and for when you go home. Document Released: 12/25/2006 Document Revised: 10/30/2012 Document Reviewed: 09/17/2012 [facility] Patient Information 2014 [place], [place].

## 2013-05-11 DIAGNOSIS — C541 Malignant neoplasm of endometrium: Secondary | ICD-10-CM

## 2013-05-11 HISTORY — DX: Malignant neoplasm of endometrium: C54.1

## 2013-05-16 HISTORY — PX: ABDOMINAL HYSTERECTOMY: SHX81

## 2013-05-23 ENCOUNTER — Other Ambulatory Visit: Payer: Self-pay | Admitting: Gynecologic Oncology

## 2013-05-23 DIAGNOSIS — N858 Other specified noninflammatory disorders of uterus: Secondary | ICD-10-CM

## 2013-05-26 ENCOUNTER — Telehealth: Payer: Self-pay | Admitting: Oncology

## 2013-05-26 NOTE — Telephone Encounter (Signed)
LEFT MESSAGE FOR PATIENT TO RETURN CALL TO SCHEDULE NEW PATIENT APPT.  °

## 2013-05-28 ENCOUNTER — Ambulatory Visit: Payer: No Typology Code available for payment source | Attending: Gynecologic Oncology | Admitting: Gynecologic Oncology

## 2013-05-28 ENCOUNTER — Encounter: Payer: Self-pay | Admitting: Gynecologic Oncology

## 2013-05-28 DIAGNOSIS — C541 Malignant neoplasm of endometrium: Secondary | ICD-10-CM

## 2013-05-28 DIAGNOSIS — N9489 Other specified conditions associated with female genital organs and menstrual cycle: Secondary | ICD-10-CM | POA: Insufficient documentation

## 2013-05-28 DIAGNOSIS — N7013 Chronic salpingitis and oophoritis: Secondary | ICD-10-CM | POA: Insufficient documentation

## 2013-05-28 DIAGNOSIS — N858 Other specified noninflammatory disorders of uterus: Secondary | ICD-10-CM

## 2013-05-28 DIAGNOSIS — K5730 Diverticulosis of large intestine without perforation or abscess without bleeding: Secondary | ICD-10-CM | POA: Insufficient documentation

## 2013-05-28 NOTE — Patient Instructions (Signed)
Carboplatin injection  What is this medicine?  CARBOPLATIN (KAR boe pla tin) is a chemotherapy drug. It targets fast dividing cells, like cancer cells, and causes these cells to die. This medicine is used to treat ovarian cancer and many other cancers.  This medicine may be used for other purposes; ask your health care provider or pharmacist if you have questions.  COMMON BRAND NAME(S): Paraplatin  What should I tell my health care provider before I take this medicine?  They need to know if you have any of these conditions:  -blood disorders  -hearing problems  -kidney disease  -recent or ongoing radiation therapy  -an unusual or allergic reaction to carboplatin, cisplatin, other chemotherapy, other medicines, foods, dyes, or preservatives  -pregnant or trying to get pregnant  -breast-feeding  How should I use this medicine?  This drug is usually given as an infusion into a vein. It is administered in a hospital or clinic by a specially trained health care professional.  Talk to your pediatrician regarding the use of this medicine in children. Special care may be needed.  Overdosage: If you think you have taken too much of this medicine contact a poison control center or emergency room at once.  NOTE: This medicine is only for you. Do not share this medicine with others.  What if I miss a dose?  It is important not to miss a dose. Call your doctor or health care professional if you are unable to keep an appointment.  What may interact with this medicine?  -medicines for seizures  -medicines to increase blood counts like filgrastim, pegfilgrastim, sargramostim  -some antibiotics like amikacin, gentamicin, neomycin, streptomycin, tobramycin  -vaccines  Talk to your doctor or health care professional before taking any of these medicines:  -acetaminophen  -aspirin  -ibuprofen  -ketoprofen  -naproxen  This list may not describe all possible interactions. Give your health care provider a list of all the medicines, herbs,  non-prescription drugs, or dietary supplements you use. Also tell them if you smoke, drink alcohol, or use illegal drugs. Some items may interact with your medicine.  What should I watch for while using this medicine?  Your condition will be monitored carefully while you are receiving this medicine. You will need important blood work done while you are taking this medicine.  This drug may make you feel generally unwell. This is not uncommon, as chemotherapy can affect healthy cells as well as cancer cells. Report any side effects. Continue your course of treatment even though you feel ill unless your doctor tells you to stop.  In some cases, you may be given additional medicines to help with side effects. Follow all directions for their use.  Call your doctor or health care professional for advice if you get a fever, chills or sore throat, or other symptoms of a cold or flu. Do not treat yourself. This drug decreases your body's ability to fight infections. Try to avoid being around people who are sick.  This medicine may increase your risk to bruise or bleed. Call your doctor or health care professional if you notice any unusual bleeding.  Be careful brushing and flossing your teeth or using a toothpick because you may get an infection or bleed more easily. If you have any dental work done, tell your dentist you are receiving this medicine.  Avoid taking products that contain aspirin, acetaminophen, ibuprofen, naproxen, or ketoprofen unless instructed by your doctor. These medicines may hide a fever.  Do not become   pregnant while taking this medicine. Women should inform their doctor if they wish to become pregnant or think they might be pregnant. There is a potential for serious side effects to an unborn child. Talk to your health care professional or pharmacist for more information. Do not breast-feed an infant while taking this medicine.  What side effects may I notice from receiving this medicine?  Side effects  that you should report to your doctor or health care professional as soon as possible:  -allergic reactions like skin rash, itching or hives, swelling of the face, lips, or tongue  -signs of infection - fever or chills, cough, sore throat, pain or difficulty passing urine  -signs of decreased platelets or bleeding - bruising, pinpoint red spots on the skin, black, tarry stools, nosebleeds  -signs of decreased red blood cells - unusually weak or tired, fainting spells, lightheadedness  -breathing problems  -changes in hearing  -changes in vision  -chest pain  -high blood pressure  -low blood counts - This drug may decrease the number of white blood cells, red blood cells and platelets. You may be at increased risk for infections and bleeding.  -nausea and vomiting  -pain, swelling, redness or irritation at the injection site  -pain, tingling, numbness in the hands or feet  -problems with balance, talking, walking  -trouble passing urine or change in the amount of urine  Side effects that usually do not require medical attention (report to your doctor or health care professional if they continue or are bothersome):  -hair loss  -loss of appetite  -metallic taste in the mouth or changes in taste  This list may not describe all possible side effects. Call your doctor for medical advice about side effects. You may report side effects to FDA at 1-800-FDA-1088.  Where should I keep my medicine?  This drug is given in a hospital or clinic and will not be stored at home.  NOTE: This sheet is a summary. It may not cover all possible information. If you have questions about this medicine, talk to your doctor, pharmacist, or health care provider.   2014, Elsevier/Gold Standard. (2007-06-04 14:38:05)  Paclitaxel injection  What is this medicine?  PACLITAXEL (PAK li TAX el) is a chemotherapy drug. It targets fast dividing cells, like cancer cells, and causes these cells to die. This medicine is used to treat ovarian cancer,  breast cancer, and other cancers.  This medicine may be used for other purposes; ask your health care provider or pharmacist if you have questions.  COMMON BRAND NAME(S): Onxol , Taxol  What should I tell my health care provider before I take this medicine?  They need to know if you have any of these conditions:  -blood disorders  -irregular heartbeat  -infection (especially a virus infection such as chickenpox, cold sores, or herpes)  -liver disease  -previous or ongoing radiation therapy  -an unusual or allergic reaction to paclitaxel, alcohol, polyoxyethylated castor oil, other chemotherapy agents, other medicines, foods, dyes, or preservatives  -pregnant or trying to get pregnant  -breast-feeding  How should I use this medicine?  This drug is given as an infusion into a vein. It is administered in a hospital or clinic by a specially trained health care professional.  Talk to your pediatrician regarding the use of this medicine in children. Special care may be needed.  Overdosage: If you think you have taken too much of this medicine contact a poison control center or emergency room at once.    NOTE: This medicine is only for you. Do not share this medicine with others.  What if I miss a dose?  It is important not to miss your dose. Call your doctor or health care professional if you are unable to keep an appointment.  What may interact with this medicine?  Do not take this medicine with any of the following medications:  -disulfiram  -metronidazole  This medicine may also interact with the following medications:  -cyclosporine  -diazepam  -ketoconazole  -medicines to increase blood counts like filgrastim, pegfilgrastim, sargramostim  -other chemotherapy drugs like cisplatin, doxorubicin, epirubicin, etoposide, teniposide, vincristine  -quinidine  -testosterone  -vaccines  -verapamil  Talk to your doctor or health care professional before taking any of these  medicines:  -acetaminophen  -aspirin  -ibuprofen  -ketoprofen  -naproxen  This list may not describe all possible interactions. Give your health care provider a list of all the medicines, herbs, non-prescription drugs, or dietary supplements you use. Also tell them if you smoke, drink alcohol, or use illegal drugs. Some items may interact with your medicine.  What should I watch for while using this medicine?  Your condition will be monitored carefully while you are receiving this medicine. You will need important blood work done while you are taking this medicine.  This drug may make you feel generally unwell. This is not uncommon, as chemotherapy can affect healthy cells as well as cancer cells. Report any side effects. Continue your course of treatment even though you feel ill unless your doctor tells you to stop.  In some cases, you may be given additional medicines to help with side effects. Follow all directions for their use.  Call your doctor or health care professional for advice if you get a fever, chills or sore throat, or other symptoms of a cold or flu. Do not treat yourself. This drug decreases your body's ability to fight infections. Try to avoid being around people who are sick.  This medicine may increase your risk to bruise or bleed. Call your doctor or health care professional if you notice any unusual bleeding.  Be careful brushing and flossing your teeth or using a toothpick because you may get an infection or bleed more easily. If you have any dental work done, tell your dentist you are receiving this medicine.  Avoid taking products that contain aspirin, acetaminophen, ibuprofen, naproxen, or ketoprofen unless instructed by your doctor. These medicines may hide a fever.  Do not become pregnant while taking this medicine. Women should inform their doctor if they wish to become pregnant or think they might be pregnant. There is a potential for serious side effects to an unborn child. Talk to  your health care professional or pharmacist for more information. Do not breast-feed an infant while taking this medicine.  Men are advised not to father a child while receiving this medicine.  What side effects may I notice from receiving this medicine?  Side effects that you should report to your doctor or health care professional as soon as possible:  -allergic reactions like skin rash, itching or hives, swelling of the face, lips, or tongue  -low blood counts - This drug may decrease the number of white blood cells, red blood cells and platelets. You may be at increased risk for infections and bleeding.  -signs of infection - fever or chills, cough, sore throat, pain or difficulty passing urine  -signs of decreased platelets or bleeding - bruising, pinpoint red spots on the skin, black,   tarry stools, nosebleeds  -signs of decreased red blood cells - unusually weak or tired, fainting spells, lightheadedness  -breathing problems  -chest pain  -high or low blood pressure  -mouth sores  -nausea and vomiting  -pain, swelling, redness or irritation at the injection site  -pain, tingling, numbness in the hands or feet  -slow or irregular heartbeat  -swelling of the ankle, feet, hands  Side effects that usually do not require medical attention (report to your doctor or health care professional if they continue or are bothersome):  -bone pain  -complete hair loss including hair on your head, underarms, pubic hair, eyebrows, and eyelashes  -changes in the color of fingernails  -diarrhea  -loosening of the fingernails  -loss of appetite  -muscle or joint pain  -red flush to skin  -sweating  This list may not describe all possible side effects. Call your doctor for medical advice about side effects. You may report side effects to FDA at 1-800-FDA-1088.  Where should I keep my medicine?  This drug is given in a hospital or clinic and will not be stored at home.  NOTE: This sheet is a summary. It may not cover all possible  information. If you have questions about this medicine, talk to your doctor, pharmacist, or health care provider.   2014, Elsevier/Gold Standard. (2012-04-22 16:41:21)

## 2013-05-28 NOTE — Progress Notes (Signed)
Consult Note: Gyn-Onc  Kelly [name] 38 y.o. female  CC:  Chief Complaint  Patient presents with  . Uterine Mass    HPI:   Patient is a 38 year old gravida 0 para 0 with a long history of infertility, irregular bleeding and ovarian cyst rupture is. She states that she's had pelvic pain since 2000. She is moved a great deal and is a difficulty establishing gynecologic care. Her pain is primarily that of throbbing pain and without any significant bleeding. She's always had irregular cycles with the exception of when she's been number of control pills. She has gone anywhere from not bleeding for an entire year to bleeding for an entire year. Her last normal cycle was 02/25/2013. Recently she had an episode of heavy bleeding and pain and was seen at the emergency room. On 03/27/2013 showed a CT scan of the abdomen and pelvis that revealed:  FINDINGS:  Large mass involving the endometrium, with extension to just above the expected location of the internal cervical os, maximum measurements approximating 12.5 x 8.0 x 5.2 cm. Fluid-filled tubular structure in the left adnexae, consistent with hydrosalpinx. Right ovary immediately adjacent to the right lateral fundal wall, containing cysts. Left ovary adjacent to the left lateral uterine body, normal in appearance. No free pelvic fluid. Normal appearing liver, spleen, pancreas, gallbladder, and kidneys. Gallbladder mildly contracted but normal in appearance. No biliary ductal dilation. No visible aortoiliofemoral atherosclerosis. No significant lymphadenopathy.  Normal appearing stomach and small bowel. Sigmoid colon diverticulosis without evidence of acute diverticulitis. Remainder the colon normal in appearance. Normal appendix in the right upper pelvis, just anterior to the right psoas muscle. No ascites. Small umbilical hernia containing fat. Urinary bladder unremarkable. Bone window images unremarkable. Visualized lung bases clear. Heart size  normal.  IMPRESSION:  1. Large mass involving the endometrium with involvement of the entire uterus extending to a level at or just above the expected level of the internal os of the cervix. Measurements are given above.  2. Hydrosalpinx involving the left fallopian tube. No evidence of right hydrosalpinx.  3. Normal-appearing ovaries by CT.  4. No evidence of metastatic disease in the abdomen or pelvis.  5. Sigmoid colon diverticulosis without evidence of acute diverticulitis.    She had a Pap smear January 22 that revealed atypical squamous cells with negative high risk HPV. 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 Microscopic Comment There are several intact endometrial fragments with cribriform, crowded and irregular glands consistent with atleast complex atypical hyperplasia. There are also numerous detached papillary fragments with morphologicfeatures consistent with papillary syncytial metaplasia. Immunohistochemistry is performed and there is diffuse strong estrogen receptor positivity, p53 as well as p16. CEA shows patchy positivity and ki-67 showslow to moderate activity. 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  Patient underwent a robotic-assisted total laparoscopic hysterectomy, bilateral salpingo-oophorectomy, bilateral pelvic and parotic lymphadenectomy lysis of adhesions from more than 45 minutes. Findings included a 14 cm size uterus with a wide fundus and neovascularization of the left cornua. She'll large left-sided hydrosalpinx measuring 12 cm in size extending to the posterior cul-de-sac, the ovary and the rectum. Frozen section revealed a grade 2, endometrioid adenocarcinoma with greater than 50% myometrial  invasion. Final pathology was consistent with a grade 2 lesion with 80% myometrial invasion. The tumor was involving the lower uterine segment  and extending down to the cervix was cervical involvement. Depth of invasion into the cervix was 60%. Tumor was involving the left fallopian tube in the right ovary. There was lymphovascular space involvement. 0/20 lymph nodes were involved.  While in the hospital the patient exhibited seizure type activity. She was seen by neurology. An MRI was negative. She has followup scheduled for them. EEG is pending. 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 In discussion with her neurologist they did not feel that it was a contraindication to paclitaxel based chemotherapy.  I spoke with the patient regarding her results over the phone and she wanted to come in to speak in person. She comes accompanied by her sister. Current Meds:  Outpatient Encounter Prescriptions as of 05/28/2013  Medication Sig  . docusate sodium (STOOL SOFTENER) 100 MG capsule Take 100 mg by mouth.  . ferrous sulfate 325 (65 FE) MG tablet Take 325 mg by mouth.  Marland Kitchen glipiZIDE (GLUCOTROL) 10 MG tablet Take 10 mg by mouth.  Marland Kitchen ibuprofen (ADVIL,MOTRIN) 800 MG tablet Take 800 mg by mouth.  . meloxicam (MOBIC) 7.5 MG tablet Take 7.5 mg by mouth daily.  . metFORMIN (GLUCOPHAGE) 1000 MG tablet Take 1,000 mg by mouth.  . Multiple Vitamins-Minerals (MULTIVITAMIN WITH MINERALS) tablet Take 1 tablet by mouth daily.  . naproxen sodium (ALEVE) 220 MG tablet Take 220 mg by mouth.  . Naproxen Sodium 220 MG CAPS Take 220 mg by mouth daily as needed (pain).  Marland Kitchen OVER THE COUNTER MEDICATION Take 2 tablets by mouth daily. Hairfinity vitamin  . OXcarbazepine (TRILEPTAL) 150 MG tablet Take 150 mg by mouth.  . oxyCODONE-acetaminophen (PERCOCET/ROXICET) 5-325 MG per tablet Take by mouth.  . [DISCONTINUED] ferrous sulfate 325 (65 FE) MG tablet Take 325 mg by mouth daily with breakfast.  .  [DISCONTINUED] glipiZIDE (GLUCOTROL) 10 MG tablet Take 1 tablet (10 mg total) by mouth daily before breakfast.  . [DISCONTINUED] metFORMIN (GLUCOPHAGE) 1000 MG tablet Take 1 tablet (1,000 mg total) by mouth 2 (two) times daily with a meal.    Allergy:  Allergies  Allergen Reactions  . Amoxicillin Nausea And Vomiting, Swelling and Rash    Rash, vomiting, throat closing Rash, vomiting, throat closing  . Flagyl [Metronidazole] Nausea And Vomiting    04/30/13-Per pt she is unable to tolerate this medication, causes rash 04/30/13-Per pt she is unable to tolerate this medication, causes rash  . Adhesive [Tape] Rash and Dermatitis    "takes skin off" "takes skin off"    Social Hx:   History   Social History  . Marital Status: Widowed    Spouse Name: N/A    Number of Children: N/A  . Years of Education: N/A   Occupational History  . Not on file.   Social History Main Topics  . Smoking status: Never Smoker   . Smokeless tobacco: Never Used  . Alcohol Use: No  . Drug Use: No  . Sexual Activity: Yes    Birth Control/ Protection: None   Other Topics Concern  . Not on file   Social History Narrative  . No narrative on file    Past Surgical Hx: History reviewed. No pertinent past surgical history.  Past Medical Hx:  Past Medical History  Diagnosis Date  . Diabetes mellitus without complication   . Obesity   . Seizure disorder     tonic clonic  . Pelvic pain   . Anxiety     no meds  .  GERD (gastroesophageal reflux disease)     occasional no meds   . Seizures     trigger by strong odors, last seizure 1 yr ago, no meds    Oncology Hx:    Endometrial ca   05/01/2013 Initial Diagnosis Endometrial ca   05/16/2013 Surgery IIIA endometrial ca with + adnexal involement, negative nodes    Family Hx:  Family History  Problem Relation Age of Onset  . Depression Mother   . Hypertension Father   . Hypertension Sister   . Cancer Paternal Aunt     breast  . Heart disease  Neg Hx   . Diabetes Sister     type 1 diabetes    Vitals:  There were no vitals taken for this visit.  Physical Exam: Well-nourished well-developed female in no acute distress.  Abdomen: Morbidly obese. Well healed laparoscopic incisions.  Assessment/Plan: 38 year old with a large 12 cm mass involving the endometrium. Endometrial biopsy revealed complex hyperplasia with atypia in a concerning pattern. The patient underwent definitive surgery at [facility] on March 6. 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 She had D. myometrial invasion to 80% as well as cervical stromal invasion to 60%. She has significant lymphovascular space involvement and involvement of the left fallopian tube and right adnexa.  She comes in today with her sister to discuss pathology and treatment recommendations. I discussed with her and her sister that she would need a combination of chemotherapy and radiation given "sandwich" fashion. We will start with 3 cycles of paclitaxel and carboplatin. Perform a CT scan at that point to ensure no evidence of obvious disease. Followed by pelvic and vaginal cuff brachytherapy followed by an additional 3 cycles of chemotherapy. Her questions regarding this were reviewed in answer to her satisfaction. She was given a copy of her pathology report. She was also given information regarding paclitaxel and carboplatin. She does have an appointment to see Dr. [name] for consideration of chemotherapy. She return to see me in 4 weeks for her postoperative check.  30 minutes face to face time was spent with the patient and her sister discussing her pathology and treatment recommendations.  rn.Latrelle Bazar A., MD 05/28/2013, 9:57 AM

## 2013-06-02 ENCOUNTER — Other Ambulatory Visit: Payer: No Typology Code available for payment source

## 2013-06-02 ENCOUNTER — Other Ambulatory Visit: Payer: Self-pay | Admitting: Oncology

## 2013-06-02 ENCOUNTER — Telehealth: Payer: Self-pay | Admitting: Oncology

## 2013-06-02 DIAGNOSIS — D509 Iron deficiency anemia, unspecified: Secondary | ICD-10-CM

## 2013-06-02 DIAGNOSIS — C541 Malignant neoplasm of endometrium: Secondary | ICD-10-CM

## 2013-06-02 NOTE — Telephone Encounter (Signed)
pt called to r/s appt to same day as MD visit...done....pt aware of new d.t

## 2013-06-04 ENCOUNTER — Encounter: Payer: Self-pay | Admitting: Oncology

## 2013-06-04 ENCOUNTER — Other Ambulatory Visit: Payer: No Typology Code available for payment source

## 2013-06-04 ENCOUNTER — Other Ambulatory Visit (HOSPITAL_BASED_OUTPATIENT_CLINIC_OR_DEPARTMENT_OTHER): Payer: No Typology Code available for payment source

## 2013-06-04 ENCOUNTER — Ambulatory Visit: Payer: No Typology Code available for payment source

## 2013-06-04 ENCOUNTER — Encounter: Payer: Self-pay | Admitting: *Deleted

## 2013-06-04 ENCOUNTER — Ambulatory Visit (HOSPITAL_BASED_OUTPATIENT_CLINIC_OR_DEPARTMENT_OTHER): Payer: No Typology Code available for payment source | Admitting: Oncology

## 2013-06-04 VITALS — BP 151/95 | HR 91 | Temp 98.0°F | Resp 20 | Ht 66.34 in | Wt 263.0 lb

## 2013-06-04 DIAGNOSIS — E119 Type 2 diabetes mellitus without complications: Secondary | ICD-10-CM

## 2013-06-04 DIAGNOSIS — D500 Iron deficiency anemia secondary to blood loss (chronic): Secondary | ICD-10-CM

## 2013-06-04 DIAGNOSIS — C549 Malignant neoplasm of corpus uteri, unspecified: Secondary | ICD-10-CM

## 2013-06-04 DIAGNOSIS — C541 Malignant neoplasm of endometrium: Secondary | ICD-10-CM

## 2013-06-04 DIAGNOSIS — K5900 Constipation, unspecified: Secondary | ICD-10-CM

## 2013-06-04 DIAGNOSIS — L293 Anogenital pruritus, unspecified: Secondary | ICD-10-CM

## 2013-06-04 DIAGNOSIS — B379 Candidiasis, unspecified: Secondary | ICD-10-CM

## 2013-06-04 DIAGNOSIS — K5730 Diverticulosis of large intestine without perforation or abscess without bleeding: Secondary | ICD-10-CM

## 2013-06-04 DIAGNOSIS — R309 Painful micturition, unspecified: Secondary | ICD-10-CM

## 2013-06-04 DIAGNOSIS — D509 Iron deficiency anemia, unspecified: Secondary | ICD-10-CM

## 2013-06-04 LAB — COMPREHENSIVE METABOLIC PANEL (CC13)
ALT: 9 U/L (ref 0–55)
AST: 9 U/L (ref 5–34)
Albumin: 3.3 g/dL — ABNORMAL LOW (ref 3.5–5.0)
Alkaline Phosphatase: 150 U/L (ref 40–150)
Anion Gap: 10 meq/L (ref 3–11)
BUN: 9.4 mg/dL (ref 7.0–26.0)
CO2: 25 meq/L (ref 22–29)
Calcium: 9.2 mg/dL (ref 8.4–10.4)
Chloride: 105 meq/L (ref 98–109)
Creatinine: 0.7 mg/dL (ref 0.6–1.1)
Glucose: 182 mg/dL — ABNORMAL HIGH (ref 70–140)
Potassium: 3.7 meq/L (ref 3.5–5.1)
Sodium: 141 meq/L (ref 136–145)
Total Bilirubin: 0.41 mg/dL (ref 0.20–1.20)
Total Protein: 7.9 g/dL (ref 6.4–8.3)

## 2013-06-04 LAB — CBC WITH DIFFERENTIAL/PLATELET
BASO%: 0.5 % (ref 0.0–2.0)
BASOS ABS: 0.1 10*3/uL (ref 0.0–0.1)
EOS%: 2.3 % (ref 0.0–7.0)
Eosinophils Absolute: 0.3 10*3/uL (ref 0.0–0.5)
HCT: 30.7 % — ABNORMAL LOW (ref 34.8–46.6)
HGB: 9.6 g/dL — ABNORMAL LOW (ref 11.6–15.9)
LYMPH%: 16.4 % (ref 14.0–49.7)
MCH: 19.2 pg — AB (ref 25.1–34.0)
MCHC: 31.2 g/dL — ABNORMAL LOW (ref 31.5–36.0)
MCV: 61.4 fL — AB (ref 79.5–101.0)
MONO#: 0.4 10*3/uL (ref 0.1–0.9)
MONO%: 3.8 % (ref 0.0–14.0)
NEUT#: 8.7 10*3/uL — ABNORMAL HIGH (ref 1.5–6.5)
NEUT%: 77 % — ABNORMAL HIGH (ref 38.4–76.8)
Platelets: 468 10*3/uL — ABNORMAL HIGH (ref 145–400)
RBC: 5.01 10*6/uL (ref 3.70–5.45)
RDW: 20.1 % — AB (ref 11.2–14.5)
WBC: 11.3 10*3/uL — ABNORMAL HIGH (ref 3.9–10.3)
lymph#: 1.9 10*3/uL (ref 0.9–3.3)

## 2013-06-04 LAB — URINALYSIS, MICROSCOPIC - CHCC
Bilirubin (Urine): NEGATIVE
Blood: NEGATIVE
Glucose: 100 mg/dL
Ketones: NEGATIVE mg/dL
Nitrite: NEGATIVE
Protein: NEGATIVE mg/dL
RBC / HPF: NEGATIVE (ref 0–2)
Specific Gravity, Urine: 1.005 (ref 1.003–1.035)
Urobilinogen, UR: 0.2 mg/dL (ref 0.2–1)
pH: 8 (ref 4.6–8.0)

## 2013-06-04 LAB — IRON AND TIBC CHCC
%SAT: 4 % — ABNORMAL LOW (ref 21–57)
Iron: 16 ug/dL — ABNORMAL LOW (ref 41–142)
TIBC: 353 ug/dL (ref 236–444)
UIBC: 337 ug/dL (ref 120–384)

## 2013-06-04 LAB — FERRITIN CHCC: Ferritin: 13 ng/mL (ref 9–269)

## 2013-06-04 MED ORDER — LORAZEPAM 1 MG PO TABS: ORAL_TABLET | ORAL | Status: AC

## 2013-06-04 MED ORDER — DEXAMETHASONE 4 MG PO TABS
ORAL_TABLET | ORAL | Status: DC
Start: 2013-06-04 — End: 2013-06-16

## 2013-06-04 MED ORDER — ONDANSETRON HCL 8 MG PO TABS: 8.0000 mg | ORAL_TABLET | Freq: Two times a day (BID) | ORAL | Status: DC | PRN

## 2013-06-04 MED ORDER — PANTOPRAZOLE SODIUM 40 MG PO TBEC: 40.0000 mg | DELAYED_RELEASE_TABLET | Freq: Every day | ORAL | Status: AC

## 2013-06-04 NOTE — Progress Notes (Signed)
[place] NEW PATIENT EVALUATION   Name: Kelly [name] Date: 06/04/2013 MRN: [id number] DOB: [birth year]  REFERRING PHYSICIAN: Nancy Marus CC: Jill Alexanders [facility], [place]), Lavonia Drafts. New patient referral to Dr J.Kinard   REASON FOR REFERRAL: endometrial cancer, for adjuvant chemotherapy   HISTORY OF PRESENT ILLNESS:Kelly [name] is a 38 y.o. female who is seen in consultation at the request of Dr Nancy Marus, for consideration of adjuvant chemotherapy for recently diagnosed endometrial carcinoma. Patient is alone for all of visit today.      Patient has had long history of irregular, heavy vaginal bleeding and pelvic pain, evaluated at various locations since 2000. She was seen in [facility] ED 03-27-13 for increased pain and heavy vaginal bleeding, with CT AP showing large mass involving endometrium 12.5 x 8.0x5.2 cm and apparent left hydrosalpinx. PAP 04-03-13 had atypical squamous cells, negative high risk HPV; endometrial biopsy showed complex hyperplasia with atypia and florid papillary proliferation, ER, p53 and p16 positive. She saw Dr [name] on 04-30-13 and had robotic assisted total laparoscopic hysterectomy/BSO/bilateral pelvic and para aortic node evalustion at [facility] 05-16-2013. Pathology from [facility] [phone]) had endometrioid adenocarcinoma FIGO grade 2, invading 2 cm into myometrium where wall was 2.5 cm thick, no serosal involvement, lower uterine segment involvement present, cervical involvement present 67m depth, involvement of left fallopian tube and right ovary,lymphovascular space invasion present and extensive, all nodes negative (8 right pelvic, 6 left pelvic, 5 right periaortic, 2 left periaortic). Stains were negative for serous component. [facility], described as screaming and shaking by family,  for which she was seen by neurology, with MRI negative and EEG pending. 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  Gyn oncology has recommended taxol carboplatin given in sandwich fashion with RT.   Patient had chemotherapy teaching today, needs radiation oncology consultation still. [facility]  Since discharge home, she has had some generalized abdominal pain as well as ongoing constipation, last BM small amount last pm. She stopped using oxycodone last week due to constipation, has used Aleve since then. She complains of vaginal discomfort and some itching, was treated for trichomonas x2 prior to surgery. She has had frequent hot flashes, no bleeding, usual frequent urination tho not clear dysuria, no fever.  She does not check blood sugars at home, tho she knows how to do this; she needs glucometer, which PCP may be able to help obtain. Peripheral venous access seems adequate at least to begin treatment.  REVIEW OF SYSTEMS as above, also: No HA, has glasses, no environmental allergies, no difficulty hearing. Partial dentures, chronic gum disease. No respiratory symptoms. No thyroid disease. Had mammograms to evaluate area determined benign. Weight down 100 lbs since 2012 intentionally (walking). No arthritis. Bothersome GERD not new. Tolerates ferrous sulfate without difficulty. Seizures since age 38 tonic clonic with loss of consciousness and loss of bowel/bladder. Tingling right index finger since ? Arterial line at surgery.  Remainder of full 10 point review of systems negative.   ALLERGIES: Amoxicillin; Flagyl; and Adhesive Note she took amoxicillin and flagyl at same time, developed rash, not clear which agent was the problem.  PAST MEDICAL/ SURGICAL HISTORY:    Diabetes diagnosed age 38 on oral agent Seizure disorder since age 89 [facility] now on [facility]. G0P0 Sigmoid diverticulosis  by  CT Gum disease Trichomonas ~ 04-2013  CURRENT MEDICATIONS: reviewed as listed now in EMR. WIll add protonix daily and increase miralax and colace to keep bowels moving well daily. Will send prescriptions for decadron 20 mg 12 hrs and 6 hrs prior to taxol, zofran and ativan. Tho I had planned to give course of diflucan empirically for the vaginal discomfort, pharmacist notes that allergy to flagyl is contraindication to diflucan. She will continue po iron.  [place]   SOCIAL HISTORY:  Widowed. Adopted infant daughter at birth 4 months ago. Has lived in [place] x 8 years, previously has lived in multiple locations in [place] and abroad "[place] a nom[place]". 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 Never smoker. Denies drugs, ETOH. Mother and sister live with patient and the baby.   FAMILY HISTORY:  1 sister DM on insulin, 1 sister HTN Father HTN Mother gum disease, degenerative bone problems Paternal aunt with breast cancer in 22s          PHYSICAL EXAM:  height is 5' 6.34" (1.685 m) and weight is 263 lb (119.296 kg). Her oral temperature is 98 F (36.7 C). Her blood pressure is 151/95 and her pulse is 91. Her respiration is 20.  Alert, pleasant, cooperative. Ambulatory without difficulty. Good historian. Not in any acut discomfort.  HEENT:normal hair pattern. PERRL, not icteric. Oral mucosa moist and clear, no obvious dental problems. Neck supple without JVD or thyroid mass  RESPIRATORY: lungs clear to A and P  CARDIAC/ VASCULAR: RRR without murmur or gallop, clear heart sounds  ABDOMEN: soft, few bowel sounds, not distended, no appreciable HSM or mass. Laparoscopic incisions all closed and not remarkable.  LYMPH NODES: no cervical, supraclavicular, axillary or inguinal adenopathy  BREASTS: bilaterally without dominant mass, skin or nipple findings  NEUROLOGIC:other than right index finger, nonfocal including CN, motor, sensory,  cerebellar  SKIN:without ecchymoses, rash, petechiae  MUSCULOSKELETAL: extremities without clubbing, cyanosis, edema, cords or tenderness. Back nontender    LABORATORY DATA:  Results for orders placed in visit on 06/04/13 (from the past 48 hour(s))  CBC WITH DIFFERENTIAL     Status: Abnormal   Collection Time    06/04/13  2:52 PM      Result Value Ref Range   WBC 11.3 (*) 3.9 - 10.3 10e3/uL   NEUT# 8.7 (*) 1.5 - 6.5 10e3/uL   HGB 9.6 (*) 11.6 - 15.9 g/dL   HCT 30.7 (*) 34.8 - 46.6 %   Platelets 468 (*) 145 - 400 10e3/uL   MCV 61.4 (*) 79.5 - 101.0 fL   MCH 19.2 (*) 25.1 - 34.0 pg   MCHC 31.2 (*) 31.5 - 36.0 g/dL   RBC 5.01  3.70 - 5.45 10e6/uL   RDW 20.1 (*) 11.2 - 14.5 %   lymph# 1.9  0.9 - 3.3 10e3/uL   MONO# 0.4  0.1 - 0.9 10e3/uL   Eosinophils Absolute 0.3  0.0 - 0.5 10e3/uL   Basophils Absolute 0.1  0.0 - 0.1 10e3/uL   NEUT% 77.0 (*) 38.4 - 76.8 %   LYMPH% 16.4  14.0 - 49.7 %   MONO% 3.8  0.0 - 14.0 %   EOS% 2.3  0.0 - 7.0 %   BASO% 0.5  0.0 - 2.0 %  IRON AND TIBC [facility]     Status: Abnormal   Collection Time    06/04/13  2:52 PM      Result Value Ref  Range   Iron 16 (*) 41 - 142 ug/dL   TIBC 353  236 - 444 ug/dL   UIBC 337  120 - 384 ug/dL   %SAT 4 (*) 21 - 57 %  FERRITIN [facility]     Status: None   Collection Time    06/04/13  2:52 PM      Result Value Ref Range   Ferritin 13  9 - 269 ng/ml  COMPREHENSIVE METABOLIC PANEL (FK81)     Status: Abnormal   Collection Time    06/04/13  2:52 PM      Result Value Ref Range   Sodium 141  136 - 145 mEq/L   Potassium 3.7  3.5 - 5.1 mEq/L   Chloride 105  98 - 109 mEq/L   CO2 25  22 - 29 mEq/L   Glucose 182 (*) 70 - 140 mg/dl   BUN 9.4  7.0 - 26.0 mg/dL   Creatinine 0.7  0.6 - 1.1 mg/dL   Total Bilirubin 0.41  0.20 - 1.20 mg/dL   Alkaline Phosphatase 150  40 - 150 U/L   AST 9  5 - 34 U/L   ALT 9  0 - 55 U/L   Total Protein 7.9  6.4 - 8.3 g/dL   Albumin 3.3 (*) 3.5 - 5.0 g/dL   Calcium 9.2  8.4 - 10.4 mg/dL   Anion  Gap 10  3 - 11 mEq/L  URINALYSIS, MICROSCOPIC - [facility]     Status: None   Collection Time    06/04/13  4:57 PM      Result Value Ref Range   Glucose 100  Negative mg/dL   Comment: Note: New unit of measure   Bilirubin (Urine) Negative  Negative   Ketones Negative  Negative mg/dL   Specific Gravity, Urine 1.005  1.003 - 1.035   Blood Negative  Negative   pH 8.0  4.6 - 8.0   Protein Negative  Negative- <30 mg/dL   Urobilinogen, UR 0.2  0.2 - 1 mg/dL   Nitrite Negative  Negative   Leukocyte Esterase Small  Negative   RBC / HPF Negative  0 - 2   WBC, UA 0-2  0 - 2   Bacteria, UA Trace  Negative- Trace   Epithelial Cells Few  Negative- Few      PATHOLOGY:  04/04/2013  Accession: [id number] Diagnosis Endometrium, biopsy COMPLEX HYPERPLASIA WITH ATYPIA ASSOCIATED WITH FLORID PAPILLARY PROLIFERATION. SEE MICROSCOPIC DESCRIPTION. Microscopic Comment There are several intact endometrial fragments with cribriform, crowded and irregular glands consistent with at least complex atypical hyperplasia. There are also numerous detached papillary fragments with morphologic features consistent with papillary syncytial metaplasia. Immunohistochemistry is performed and there is diffuse strong estrogen receptor positivity, p53 as well as p16. CEA shows patchy positivity and ki-67 shows low to moderate activity. The intact fragments are felt to represent at least complex atypical hyperplasia associated with florid papillary syncytial metaplasia and follow-up is suggested.   [facility] pathology [id number] from 05-16-13 as above, will be scanned into this EMR.  RADIOGRAPHY: CT ABDOMEN AND PELVIS WITH CONTRAST 03-27-2013  COMPARISON: None.  FINDINGS:  Large mass involving the endometrium, with extension to just above  the expected location of the internal cervical os, maximum  measurements approximating 12.5 x 8.0 x 5.2 cm. Fluid-filled tubular  structure in the left adnexae, consistent with hydrosalpinx.  Right  ovary immediately adjacent to the right lateral fundal wall,  containing cysts. Left ovary adjacent to the left lateral uterine  body, normal in appearance. No free pelvic fluid.  Normal appearing liver, spleen, pancreas, gallbladder, and kidneys.  Gallbladder mildly contracted but normal in appearance. No biliary  ductal dilation. No visible aortoiliofemoral atherosclerosis. No  significant lymphadenopathy.  Normal appearing stomach and small bowel. Sigmoid colon  diverticulosis without evidence of acute diverticulitis. Remainder  the colon normal in appearance. Normal appendix in the right upper  pelvis, just anterior to the right psoas muscle. No ascites. Small  umbilical hernia containing fat.  Urinary bladder unremarkable. Bone window images unremarkable.  Visualized lung bases clear. Heart size normal.  IMPRESSION:  1. Large mass involving the endometrium with involvement of the  entire uterus extending to a level at or just above the expected  level of the internal os of the cervix. Measurements are given  above.  2. Hydrosalpinx involving the left fallopian tube. No evidence of  right hydrosalpinx.  3. Normal-appearing ovaries by CT.  4. No evidence of metastatic disease in the abdomen or pelvis.  5. Sigmoid colon diverticulosis without evidence of acute  diverticulitis.  Please note that I do not believe further imaging with ultrasound  would add additional information to that on the CT. If further  imaging is felt necessary, a nonemergent MRI of the pelvis with and  without contrast would be suggested.       DISCUSSION: we have discussed history as above, surgical findings and rationale for additional treatment with chemotherapy and radiation. We have discussed chemotherapy mechanism of action, side effects and usual outpatient schedule. We have discussed premedication decadron and antiemetics. She understands how to contact this office if questions or concerns  between follow up visits.     IMPRESSION / PLAN:    1.Endometroid adenocarcinoma FIGO grade 2 extending into outer half of myometrium, lower uterine segment, involving cervix, left fallopian tube and right ovary, and extensive LVSI tho nodes negative: in 38 yo now post TAH/BSO 05-16-13. She is in agreement with proceeding with chemotherapy next week, and I will see her back at least 06-18-13 with labs. Steroids and antiemetics as above. 2.Diabetes: On glucophage. she needs glucometer for home if possible (note sister has glucometer, so may be able to use this around first treatment if unable to get device for patient quickly). Note will be on steroids around each treatment.WIll cover with SS regular insulin at chemo. 3.iron deficiency: related to long term heavy gyn bleeding. recently begun on oral iron, follow. 4.seizure disorder: recently med changed to trileptal, follow up by neurology scheduled [facility]) 5.GERD: add protonix 6.trichomonas prior to surgery: vaginal discomfort now. If symptoms persist can discuss with gyn/ gyn onc. Note diflucan/ flagyl concerns above 7.Constipation: post surgery and with pain meds. Plan as above. 8.sigmoid diverticulosis by scans    Patient had questions answered to her satisfaction and is in agreement with plan above. She can contact this office for questions or concerns at any time prior to next scheduled visit.  Time spent  60 min, including >50% discussion and coordination of care.    Gordy Levan, MD 06/04/2013 5:27 PM

## 2013-06-04 NOTE — Progress Notes (Signed)
Checked in new pt with no financial concerns at this time. ° °

## 2013-06-04 NOTE — Patient Instructions (Addendum)
We will send prescriptions to [facility]  (generics are fine if you prefer these)  Diflucan (fluconazole) for possible yeast  100 mg daily for 7 days  Zofran (ondansetron) 8 mg    1 - 2 tablets every 12 hrs as needed for nausea after chemo.  This will not make you drowsy.  You should also take one tablet the AM after first chemo, whether or not any nausea  Ativan (lorazepam) 1 mg    1/2 - 1 tablet swallow or dissolve under tongue every 6 hours as needed for nausea after chemo. This will make you drowsy.  You should also take one tablet night of first chemo whether or not any nausea  Decadron (dexamethasone, steroid)  4 mg    Five tablets (=20 mg) with food 12 hrs before chemo and five tablets (=20 mg) with food 6 hours before chemo, to decrease chance of allergy reaction  Protonix  40 mg   One daily for stomach irritation/ reflux    Try taking your ferrous sulfate (iron) on empty stomach with orange juice or with vitamin C tablet. If you can take this twice daily it would be good to do. Take aleve with food each dose, as it can irritate stomach also.  Suggest taking miralax twice daily and stool softener twice daily for now   You can call this office at any time if questions or concerns    [phone], and we will call you about schedule of next appointments

## 2013-06-05 ENCOUNTER — Telehealth: Payer: Self-pay | Admitting: *Deleted

## 2013-06-05 ENCOUNTER — Telehealth: Payer: Self-pay | Admitting: Medical

## 2013-06-05 LAB — URINE CULTURE

## 2013-06-05 NOTE — Telephone Encounter (Signed)
Lets get her in to recheck on diabetes NOW, to give her glucometer and scripts for testing supplies if she doesn't have them.

## 2013-06-05 NOTE — Telephone Encounter (Signed)
Per 3/25 POF I have scheduled appts. I have called and gave to the patient. Mailed calendar. Sent MD message for a time on 4/16 to see her.

## 2013-06-05 NOTE — Telephone Encounter (Signed)
Called patient's PCP office requesting if they have diabetic testing supplies.  Patient to begin steroid dexamethasone next week.  Awaiting return call from office.

## 2013-06-05 NOTE — Telephone Encounter (Signed)
See msg, make apt

## 2013-06-06 ENCOUNTER — Telehealth: Payer: Self-pay | Admitting: *Deleted

## 2013-06-06 ENCOUNTER — Telehealth: Payer: Self-pay

## 2013-06-06 LAB — HEMOGLOBINOPATHY EVALUATION
HGB A: 62.3 % — AB (ref 96.8–97.8)
Hemoglobin Other: 0 %
Hgb A2 Quant: 2.3 % (ref 2.2–3.2)
Hgb F Quant: 0 % (ref 0.0–2.0)
Hgb S Quant: 35.4 % — ABNORMAL HIGH

## 2013-06-06 NOTE — Telephone Encounter (Signed)
Told Ms. Rosario-Rivera that she does not have a UTI as noted below by  Dr. [name]

## 2013-06-06 NOTE — Telephone Encounter (Signed)
Message copied by Baruch Merl on Fri Jun 06, 2013 11:10 AM ------      Message from: Gordy Levan      Created: Thu Jun 05, 2013  7:36 PM       Labs seen and need follow up: please let her know no urine infection. ------

## 2013-06-06 NOTE — Telephone Encounter (Signed)
Per staff message from MD I have scheduled appts 

## 2013-06-06 NOTE — Telephone Encounter (Signed)
appt made

## 2013-06-06 NOTE — Telephone Encounter (Signed)
Phone note dated 06-05-13 in [place] stating that patient to be scheduled for an appointment at Dr. Beverly Gust office for recheck on diabetes and to give glucometer and test strips.   Ms. Bosserman stated that she has not heard from that office.  She said she will call them to make an appointment.  Suggested is get an appointment today or Monday as her first treatment is 06-11-13.   Patient verbalized understanding.

## 2013-06-06 NOTE — Telephone Encounter (Signed)
Noted in [facility] that Kelly [name] has an appointment  on 06-09-13 with Chana Bode PA-C regarding diabetes.

## 2013-06-09 ENCOUNTER — Ambulatory Visit: Payer: Self-pay | Admitting: Medical

## 2013-06-10 ENCOUNTER — Telehealth: Payer: Self-pay | Admitting: Oncology

## 2013-06-10 ENCOUNTER — Telehealth: Payer: Self-pay

## 2013-06-10 NOTE — Telephone Encounter (Signed)
lvm for pt with all appt d.ts for April... °

## 2013-06-10 NOTE — Telephone Encounter (Signed)
Message copied by Baruch Merl on Tue Jun 10, 2013  6:30 PM ------      Message from: Evlyn Clines P      Created: Mon Jun 09, 2013  9:59 PM       First taxol carbo 4-1.      Please go over times for decadron 12 hrs and 6 hrs prior.      Remind her to take ativan night of rx and zofran Am after rx whether or not nausea.      Please see if she has gotten glucometer. Remind her that blood sugar will likely be significantly higher around the steroids. She needs to push unsweetened fluids and watch diet closely. We will check CBG here and cover with insulin during chemo if needed.            Cc LA, TH ------

## 2013-06-10 NOTE — Telephone Encounter (Signed)
lvm for pt with all appt d.ts for April.Kelly KitchenMarland [name]

## 2013-06-10 NOTE — Telephone Encounter (Signed)
Did not reach patient.  Left a message in her voice mail with information noted below.  Note that Treatment is at 1130 on 06-11-13.

## 2013-06-11 ENCOUNTER — Ambulatory Visit (HOSPITAL_BASED_OUTPATIENT_CLINIC_OR_DEPARTMENT_OTHER): Payer: No Typology Code available for payment source

## 2013-06-11 ENCOUNTER — Other Ambulatory Visit: Payer: Self-pay | Admitting: Oncology

## 2013-06-11 VITALS — BP 145/91 | HR 104 | Temp 97.8°F | Resp 20

## 2013-06-11 DIAGNOSIS — C541 Malignant neoplasm of endometrium: Secondary | ICD-10-CM

## 2013-06-11 DIAGNOSIS — C549 Malignant neoplasm of corpus uteri, unspecified: Secondary | ICD-10-CM

## 2013-06-11 DIAGNOSIS — E119 Type 2 diabetes mellitus without complications: Secondary | ICD-10-CM

## 2013-06-11 DIAGNOSIS — Z5111 Encounter for antineoplastic chemotherapy: Secondary | ICD-10-CM

## 2013-06-11 LAB — COMPREHENSIVE METABOLIC PANEL (CC13)
ALT: 11 U/L (ref 0–55)
AST: 10 U/L (ref 5–34)
Albumin: 3.6 g/dL (ref 3.5–5.0)
Alkaline Phosphatase: 162 U/L — ABNORMAL HIGH (ref 40–150)
Anion Gap: 12 mEq/L — ABNORMAL HIGH (ref 3–11)
BILIRUBIN TOTAL: 0.57 mg/dL (ref 0.20–1.20)
BUN: 12.1 mg/dL (ref 7.0–26.0)
CALCIUM: 9.5 mg/dL (ref 8.4–10.4)
CHLORIDE: 105 meq/L (ref 98–109)
CO2: 21 mEq/L — ABNORMAL LOW (ref 22–29)
CREATININE: 0.8 mg/dL (ref 0.6–1.1)
GLUCOSE: 420 mg/dL — AB (ref 70–140)
Potassium: 4 mEq/L (ref 3.5–5.1)
Sodium: 138 mEq/L (ref 136–145)
TOTAL PROTEIN: 8.4 g/dL — AB (ref 6.4–8.3)

## 2013-06-11 LAB — CBC WITH DIFFERENTIAL/PLATELET
BASO%: 0.2 % (ref 0.0–2.0)
Basophils Absolute: 0 10*3/uL (ref 0.0–0.1)
EOS%: 0 % (ref 0.0–7.0)
Eosinophils Absolute: 0 10*3/uL (ref 0.0–0.5)
HCT: 31.2 % — ABNORMAL LOW (ref 34.8–46.6)
HGB: 9.6 g/dL — ABNORMAL LOW (ref 11.6–15.9)
LYMPH%: 6.1 % — ABNORMAL LOW (ref 14.0–49.7)
MCH: 18.8 pg — ABNORMAL LOW (ref 25.1–34.0)
MCHC: 30.9 g/dL — ABNORMAL LOW (ref 31.5–36.0)
MCV: 61 fL — ABNORMAL LOW (ref 79.5–101.0)
MONO#: 0 10*3/uL — AB (ref 0.1–0.9)
MONO%: 0.2 % (ref 0.0–14.0)
NEUT#: 13.6 10*3/uL — ABNORMAL HIGH (ref 1.5–6.5)
NEUT%: 93.5 % — ABNORMAL HIGH (ref 38.4–76.8)
Platelets: 461 10*3/uL — ABNORMAL HIGH (ref 145–400)
RBC: 5.1 10*6/uL (ref 3.70–5.45)
RDW: 19.8 % — ABNORMAL HIGH (ref 11.2–14.5)
WBC: 14.5 10*3/uL — AB (ref 3.9–10.3)
lymph#: 0.9 10*3/uL (ref 0.9–3.3)

## 2013-06-11 LAB — WHOLE BLOOD GLUCOSE
Glucose: 384 mg/dL — ABNORMAL HIGH (ref 70–100)
Glucose: 397 mg/dL — ABNORMAL HIGH (ref 70–100)
HRS PC: 6 Hours
HRS PC: 1 Hours

## 2013-06-11 MED ORDER — FAMOTIDINE IN NACL 20-0.9 MG/50ML-% IV SOLN
INTRAVENOUS | Status: AC
  Filled 2013-06-11: qty 50

## 2013-06-11 MED ORDER — INSULIN REGULAR HUMAN 100 UNIT/ML IJ SOLN
8.0000 [IU] | Freq: Once | INTRAMUSCULAR | Status: AC
  Administered 2013-06-11: 8 [IU] via SUBCUTANEOUS
  Filled 2013-06-11: qty 0.08

## 2013-06-11 MED ORDER — CARBOPLATIN CHEMO INJECTION 600 MG/60ML
750.0000 mg | Freq: Once | INTRAVENOUS | Status: AC
  Administered 2013-06-11: 750 mg via INTRAVENOUS
  Filled 2013-06-11: qty 75

## 2013-06-11 MED ORDER — ONDANSETRON 16 MG/50ML IVPB (CHCC)
16.0000 mg | Freq: Once | INTRAVENOUS | Status: AC
  Administered 2013-06-11: 16 mg via INTRAVENOUS

## 2013-06-11 MED ORDER — DEXAMETHASONE SODIUM PHOSPHATE 20 MG/5ML IJ SOLN
20.0000 mg | Freq: Once | INTRAMUSCULAR | Status: AC
Start: 2013-06-11 — End: 2013-06-11
  Administered 2013-06-11: 20 mg via INTRAVENOUS

## 2013-06-11 MED ORDER — ONDANSETRON 16 MG/50ML IVPB (CHCC)
INTRAVENOUS | Status: AC
  Filled 2013-06-11: qty 16

## 2013-06-11 MED ORDER — INSULIN REGULAR HUMAN 100 UNIT/ML IJ SOLN
8.0000 [IU] | Freq: Once | INTRAMUSCULAR | Status: AC | PRN
  Administered 2013-06-11: 8 [IU] via SUBCUTANEOUS
  Filled 2013-06-11: qty 0.08

## 2013-06-11 MED ORDER — PACLITAXEL CHEMO INJECTION 300 MG/50ML
175.0000 mg/m2 | Freq: Once | INTRAVENOUS | Status: AC
  Administered 2013-06-11: 414 mg via INTRAVENOUS
  Filled 2013-06-11: qty 69

## 2013-06-11 MED ORDER — DEXAMETHASONE SODIUM PHOSPHATE 20 MG/5ML IJ SOLN
INTRAMUSCULAR | Status: AC
  Filled 2013-06-11: qty 5

## 2013-06-11 MED ORDER — DIPHENHYDRAMINE HCL 50 MG/ML IJ SOLN
INTRAMUSCULAR | Status: AC
  Filled 2013-06-11: qty 1

## 2013-06-11 MED ORDER — SODIUM CHLORIDE 0.9 % IV SOLN
Freq: Once | INTRAVENOUS | Status: AC
  Administered 2013-06-11: 13:00:00 via INTRAVENOUS

## 2013-06-11 MED ORDER — FAMOTIDINE IN NACL 20-0.9 MG/50ML-% IV SOLN
20.0000 mg | Freq: Once | INTRAVENOUS | Status: AC
  Administered 2013-06-11: 20 mg via INTRAVENOUS

## 2013-06-11 MED ORDER — DIPHENHYDRAMINE HCL 50 MG/ML IJ SOLN
50.0000 mg | Freq: Once | INTRAMUSCULAR | Status: AC
Start: 2013-06-11 — End: 2013-06-11
  Administered 2013-06-11: 50 mg via INTRAVENOUS

## 2013-06-11 NOTE — Patient Instructions (Signed)
[facility] Discharge Instructions for Patients Receiving Chemotherapy  Today you received the following chemotherapy agents: Taxol, Carboplatin.  To help prevent nausea and vomiting after your treatment, we encourage you to take your nausea medication.   If you develop nausea and vomiting that is not controlled by your nausea medication, call the clinic.   BELOW ARE SYMPTOMS THAT SHOULD BE REPORTED IMMEDIATELY:  *FEVER GREATER THAN 100.5 F  *CHILLS WITH OR WITHOUT FEVER  NAUSEA AND VOMITING THAT IS NOT CONTROLLED WITH YOUR NAUSEA MEDICATION  *UNUSUAL SHORTNESS OF BREATH  *UNUSUAL BRUISING OR BLEEDING  TENDERNESS IN MOUTH AND THROAT WITH OR WITHOUT PRESENCE OF ULCERS  *URINARY PROBLEMS  *BOWEL PROBLEMS  UNUSUAL RASH Items with * indicate a potential emergency and should be followed up as soon as possible.  Feel free to call the clinic you have any questions or concerns. The clinic phone number is [phone].    

## 2013-06-11 NOTE — Progress Notes (Signed)
I had sent a message asking for office visit followup. When she scheduled to come in?

## 2013-06-11 NOTE — Progress Notes (Signed)
After 2 hours of Taxol infusion, pt complained of burning above IV site. Good blood return noted, IV flushed with saline and restarted, however, patient continued to experience burning, therefore IV discontinued. Second IV site obtained with excellent blood return. Pt again complained of burning. IV site DC'd. Third IV site obtained and continues to be patent. Patient expressed interest in [place]. Information forwarded to Dr. [name].

## 2013-06-12 ENCOUNTER — Telehealth: Payer: Self-pay | Admitting: *Deleted

## 2013-06-12 NOTE — Telephone Encounter (Signed)
Spoke with pt today for post chemo follow up call.  Pt stated she was doing fine.  Denied nausea/vomiting, denied pain, appetite good and drinking fluids as tolerated.  Stated had diarrhea last pm after chemo; however, pt still taking stool softener.  Pt stated no diarrhea today so far.  Instructed pt not to take stool softener but pt needs to take Imodium to help with diarrhea.  Instructed pt to increase po fluid intake - gatorade, soup, jello, eat toast, apple sauce.  Pt stated she has been taking antiemetics as instructed by md.  Bladder functions fine.  Confirmed with pt next office appt on  06/18/13. Pt stated she had a very good experience with her first chemo treatment yesterday.  Staff were courteous, nice, and was provided with explanations throughout her treatment.  No other concerns at this time.

## 2013-06-15 ENCOUNTER — Other Ambulatory Visit: Payer: Self-pay

## 2013-06-15 ENCOUNTER — Other Ambulatory Visit: Payer: Self-pay | Admitting: Oncology

## 2013-06-15 ENCOUNTER — Inpatient Hospital Stay (HOSPITAL_COMMUNITY)
Admission: EM | Admit: 2013-06-15 | Discharge: 2013-06-20 | DRG: 246 | Disposition: A | Discharge status: Home / Self Care | Payer: No Typology Code available for payment source | Attending: Internal Medicine | Admitting: Internal Medicine

## 2013-06-15 ENCOUNTER — Emergency Department (HOSPITAL_COMMUNITY): Payer: No Typology Code available for payment source

## 2013-06-15 ENCOUNTER — Encounter (HOSPITAL_COMMUNITY): Payer: Self-pay | Admitting: Emergency Medicine

## 2013-06-15 DIAGNOSIS — Z7982 Long term (current) use of aspirin: Secondary | ICD-10-CM

## 2013-06-15 DIAGNOSIS — Z888 Allergy status to other drugs, medicaments and biological substances status: Secondary | ICD-10-CM

## 2013-06-15 DIAGNOSIS — F329 Major depressive disorder, single episode, unspecified: Secondary | ICD-10-CM

## 2013-06-15 DIAGNOSIS — R87610 Atypical squamous cells of undetermined significance on cytologic smear of cervix (ASC-US): Secondary | ICD-10-CM

## 2013-06-15 DIAGNOSIS — R569 Unspecified convulsions: Secondary | ICD-10-CM | POA: Diagnosis present

## 2013-06-15 DIAGNOSIS — E1165 Type 2 diabetes mellitus with hyperglycemia: Secondary | ICD-10-CM

## 2013-06-15 DIAGNOSIS — G40909 Epilepsy, unspecified, not intractable, without status epilepticus: Secondary | ICD-10-CM | POA: Diagnosis present

## 2013-06-15 DIAGNOSIS — R079 Chest pain, unspecified: Secondary | ICD-10-CM | POA: Diagnosis present

## 2013-06-15 DIAGNOSIS — D573 Sickle-cell trait: Secondary | ICD-10-CM | POA: Diagnosis present

## 2013-06-15 DIAGNOSIS — Z794 Long term (current) use of insulin: Secondary | ICD-10-CM

## 2013-06-15 DIAGNOSIS — I959 Hypotension, unspecified: Secondary | ICD-10-CM | POA: Diagnosis not present

## 2013-06-15 DIAGNOSIS — I442 Atrioventricular block, complete: Secondary | ICD-10-CM | POA: Diagnosis present

## 2013-06-15 DIAGNOSIS — R0602 Shortness of breath: Secondary | ICD-10-CM | POA: Diagnosis present

## 2013-06-15 DIAGNOSIS — IMO0001 Reserved for inherently not codable concepts without codable children: Secondary | ICD-10-CM | POA: Diagnosis present

## 2013-06-15 DIAGNOSIS — E119 Type 2 diabetes mellitus without complications: Secondary | ICD-10-CM

## 2013-06-15 DIAGNOSIS — Z955 Presence of coronary angioplasty implant and graft: Secondary | ICD-10-CM

## 2013-06-15 DIAGNOSIS — F3289 Other specified depressive episodes: Secondary | ICD-10-CM

## 2013-06-15 DIAGNOSIS — K5900 Constipation, unspecified: Secondary | ICD-10-CM | POA: Diagnosis present

## 2013-06-15 DIAGNOSIS — Z91040 Latex allergy status: Secondary | ICD-10-CM

## 2013-06-15 DIAGNOSIS — J189 Pneumonia, unspecified organism: Secondary | ICD-10-CM | POA: Diagnosis present

## 2013-06-15 DIAGNOSIS — IMO0002 Reserved for concepts with insufficient information to code with codable children: Secondary | ICD-10-CM | POA: Diagnosis present

## 2013-06-15 DIAGNOSIS — C541 Malignant neoplasm of endometrium: Secondary | ICD-10-CM

## 2013-06-15 DIAGNOSIS — K121 Other forms of stomatitis: Secondary | ICD-10-CM | POA: Diagnosis present

## 2013-06-15 DIAGNOSIS — K1230 Oral mucositis (ulcerative), unspecified: Secondary | ICD-10-CM

## 2013-06-15 DIAGNOSIS — Z803 Family history of malignant neoplasm of breast: Secondary | ICD-10-CM

## 2013-06-15 DIAGNOSIS — Z833 Family history of diabetes mellitus: Secondary | ICD-10-CM

## 2013-06-15 DIAGNOSIS — Z8249 Family history of ischemic heart disease and other diseases of the circulatory system: Secondary | ICD-10-CM

## 2013-06-15 DIAGNOSIS — K219 Gastro-esophageal reflux disease without esophagitis: Secondary | ICD-10-CM | POA: Diagnosis present

## 2013-06-15 DIAGNOSIS — Z818 Family history of other mental and behavioral disorders: Secondary | ICD-10-CM

## 2013-06-15 DIAGNOSIS — Z7902 Long term (current) use of antithrombotics/antiplatelets: Secondary | ICD-10-CM

## 2013-06-15 DIAGNOSIS — I119 Hypertensive heart disease without heart failure: Secondary | ICD-10-CM | POA: Diagnosis present

## 2013-06-15 DIAGNOSIS — D509 Iron deficiency anemia, unspecified: Secondary | ICD-10-CM | POA: Diagnosis present

## 2013-06-15 DIAGNOSIS — Z6839 Body mass index (BMI) 39.0-39.9, adult: Secondary | ICD-10-CM

## 2013-06-15 DIAGNOSIS — I213 ST elevation (STEMI) myocardial infarction of unspecified site: Secondary | ICD-10-CM | POA: Diagnosis present

## 2013-06-15 DIAGNOSIS — N858 Other specified noninflammatory disorders of uterus: Secondary | ICD-10-CM | POA: Diagnosis present

## 2013-06-15 DIAGNOSIS — Z881 Allergy status to other antibiotic agents status: Secondary | ICD-10-CM

## 2013-06-15 DIAGNOSIS — I2119 ST elevation (STEMI) myocardial infarction involving other coronary artery of inferior wall: Principal | ICD-10-CM | POA: Diagnosis present

## 2013-06-15 DIAGNOSIS — C549 Malignant neoplasm of corpus uteri, unspecified: Secondary | ICD-10-CM | POA: Diagnosis present

## 2013-06-15 DIAGNOSIS — R000 Tachycardia, unspecified: Secondary | ICD-10-CM | POA: Diagnosis present

## 2013-06-15 DIAGNOSIS — F411 Generalized anxiety disorder: Secondary | ICD-10-CM | POA: Diagnosis present

## 2013-06-15 DIAGNOSIS — Z79899 Other long term (current) drug therapy: Secondary | ICD-10-CM

## 2013-06-15 LAB — COMPREHENSIVE METABOLIC PANEL WITH GFR
ALT: 12 U/L (ref 0–35)
AST: 7 U/L (ref 0–37)
Albumin: 3.7 g/dL (ref 3.5–5.2)
Alkaline Phosphatase: 141 U/L — ABNORMAL HIGH (ref 39–117)
BUN: 9 mg/dL (ref 6–23)
CO2: 21 meq/L (ref 19–32)
Calcium: 10.2 mg/dL (ref 8.4–10.5)
Chloride: 91 meq/L — ABNORMAL LOW (ref 96–112)
Creatinine, Ser: 0.45 mg/dL — ABNORMAL LOW (ref 0.50–1.10)
GFR calc Af Amer: >90 mL/min
GFR calc non Af Amer: >90 mL/min
Glucose, Bld: 439 mg/dL — ABNORMAL HIGH (ref 70–99)
Potassium: 4 meq/L (ref 3.7–5.3)
Sodium: 130 meq/L — ABNORMAL LOW (ref 137–147)
Total Bilirubin: 0.7 mg/dL (ref 0.3–1.2)
Total Protein: 8.3 g/dL (ref 6.0–8.3)

## 2013-06-15 LAB — I-STAT CHEM 8, ED
BUN: 7 mg/dL (ref 6–23)
CHLORIDE: 97 meq/L (ref 96–112)
CREATININE: 0.5 mg/dL (ref 0.50–1.10)
Calcium, Ion: 1.15 mmol/L (ref 1.12–1.23)
GLUCOSE: 442 mg/dL — AB (ref 70–99)
HCT: 37 % (ref 36.0–46.0)
Hemoglobin: 12.6 g/dL (ref 12.0–15.0)
Potassium: 4 mEq/L (ref 3.7–5.3)
Sodium: 134 mEq/L — ABNORMAL LOW (ref 137–147)
TCO2: 23 mmol/L (ref 0–100)

## 2013-06-15 LAB — CBC WITH DIFFERENTIAL/PLATELET
BASOS ABS: 0 10*3/uL (ref 0.0–0.1)
Basophils Relative: 0 % (ref 0–1)
EOS ABS: 0.3 10*3/uL (ref 0.0–0.7)
Eosinophils Relative: 4 % (ref 0–5)
HCT: 31.5 % — ABNORMAL LOW (ref 36.0–46.0)
HEMOGLOBIN: 10.3 g/dL — AB (ref 12.0–15.0)
LYMPHS PCT: 32 % (ref 12–46)
Lymphs Abs: 2.1 10*3/uL (ref 0.7–4.0)
MCH: 19.4 pg — ABNORMAL LOW (ref 26.0–34.0)
MCHC: 32.7 g/dL (ref 30.0–36.0)
MCV: 59.2 fL — ABNORMAL LOW (ref 78.0–100.0)
Monocytes Absolute: 0.1 10*3/uL (ref 0.1–1.0)
Monocytes Relative: 1 % — ABNORMAL LOW (ref 3–12)
NEUTROS PCT: 63 % (ref 43–77)
Neutro Abs: 4.2 10*3/uL (ref 1.7–7.7)
Platelets: 373 10*3/uL (ref 150–400)
RBC: 5.32 MIL/uL — ABNORMAL HIGH (ref 3.87–5.11)
RDW: 18.2 % — AB (ref 11.5–15.5)
WBC: 6.7 10*3/uL (ref 4.0–10.5)

## 2013-06-15 LAB — TROPONIN I

## 2013-06-15 LAB — I-STAT CG4 LACTIC ACID, ED: Lactic Acid, Venous: 3.26 mmol/L — ABNORMAL HIGH (ref 0.5–2.2)

## 2013-06-15 MED ORDER — IOHEXOL 350 MG/ML SOLN
100.0000 mL | Freq: Once | INTRAVENOUS | Status: AC | PRN
  Administered 2013-06-15: 100 mL via INTRAVENOUS

## 2013-06-15 MED ORDER — LORAZEPAM 2 MG/ML IJ SOLN
1.0000 mg | Freq: Once | INTRAMUSCULAR | Status: AC
  Administered 2013-06-15: 1 mg via INTRAVENOUS
  Filled 2013-06-15: qty 1

## 2013-06-15 MED ORDER — MORPHINE SULFATE 4 MG/ML IJ SOLN
4.0000 mg | Freq: Once | INTRAMUSCULAR | Status: AC
  Administered 2013-06-15: 4 mg via INTRAVENOUS
  Filled 2013-06-15: qty 1

## 2013-06-15 MED ORDER — SODIUM CHLORIDE 0.9 % IV SOLN
INTRAVENOUS | Status: DC
  Administered 2013-06-16: 01:00:00 via INTRAVENOUS

## 2013-06-15 MED ORDER — DEXTROSE 5 % IV SOLN
2.0000 g | Freq: Two times a day (BID) | INTRAVENOUS | Status: DC
  Administered 2013-06-16 – 2013-06-18 (×6): 2 g via INTRAVENOUS
  Filled 2013-06-15 (×8): qty 2

## 2013-06-15 MED ORDER — VANCOMYCIN HCL IN DEXTROSE 1-5 GM/200ML-% IV SOLN
1000.0000 mg | Freq: Once | INTRAVENOUS | Status: AC
  Administered 2013-06-16: 1000 mg via INTRAVENOUS
  Filled 2013-06-15: qty 200

## 2013-06-15 MED ORDER — SODIUM CHLORIDE 0.9 % IV BOLUS (SEPSIS)
1000.0000 mL | Freq: Once | INTRAVENOUS | Status: AC
  Administered 2013-06-15: 1000 mL via INTRAVENOUS

## 2013-06-15 MED ORDER — HEPARIN (PORCINE) IN NACL 100-0.45 UNIT/ML-% IJ SOLN
1650.0000 [IU]/h | INTRAMUSCULAR | Status: DC
  Filled 2013-06-15: qty 250

## 2013-06-15 MED ORDER — ONDANSETRON HCL 4 MG/2ML IJ SOLN
4.0000 mg | Freq: Once | INTRAMUSCULAR | Status: AC
  Administered 2013-06-15: 4 mg via INTRAVENOUS
  Filled 2013-06-15: qty 2

## 2013-06-15 MED ORDER — HEPARIN BOLUS VIA INFUSION
4000.0000 [IU] | Freq: Once | INTRAVENOUS | Status: DC
  Filled 2013-06-15: qty 4000

## 2013-06-15 NOTE — Progress Notes (Addendum)
ANTICOAGULATION CONSULT NOTE - Initial Consult  Pharmacy Consult for Heparin Indication: pulmonary embolus  Allergies  Allergen Reactions  . Amoxicillin Nausea And Vomiting, Swelling and Rash    Rash, vomiting, throat closing Rash, vomiting, throat closing  . Flagyl [Metronidazole] Nausea And Vomiting    04/30/13-Per pt she is unable to tolerate this medication, causes rash 04/30/13-Per pt she is unable to tolerate this medication, causes rash  . Adhesive [Tape] Rash and Dermatitis    "takes skin off" "takes skin off"    Patient Measurements: Height: 5\' 6"  (167.6 cm) Weight: 263 lb (119.296 kg) IBW/kg (Calculated) : 59.3 Heparin Dosing Weight:   Vital Signs: Temp: 97.7 F (36.5 C) (04/05 2253) Temp src: Rectal (04/05 2253) BP: 142/118 mmHg (04/05 2225) Pulse Rate: 139 (04/05 2225)  Labs:  Recent Labs  06/15/13 2238 06/15/13 2247  HGB 10.3* 12.6  HCT 31.5* 37.0  PLT 373  --   CREATININE 0.45* 0.50  TROPONINI <0.30  --     Estimated Creatinine Clearance: 126.6 ml/min (by C-G formula based on Cr of 0.5).   Medical History: Past Medical History  Diagnosis Date  . Diabetes mellitus without complication   . Obesity   . Seizure disorder     tonic clonic  . Pelvic pain   . Anxiety     no meds  . GERD (gastroesophageal reflux disease)     occasional no meds   . Seizures     trigger by strong odors, last seizure 1 yr ago, no meds    Medications:  Infusions:  . sodium chloride    . [START ON 06/16/2013] ceFEPime (MAXIPIME) IV    . [START ON 06/16/2013] heparin    . [START ON 06/16/2013] heparin    . [START ON 06/16/2013] vancomycin      Assessment: Patient with PE.    Goal of Therapy:  Heparin level 0.3-0.7 units/ml Monitor platelets by anticoagulation protocol: Yes   Plan:  Heparin 4000 units iv bolus Heparin drip at 1650 units/hr Daily CBC + Heparin level Next heparin level at [place] 06/15/2013,11:58 PM  Update, MD changed  order and note, no heparin per pharmacy, I have d/c meds and labs.

## 2013-06-15 NOTE — ED Notes (Addendum)
Pt presents with left chest pain radiating to left arm which she describes as "pressure on the chest," shortness of breath, states she was laying in bed when she experience the sudden pain. Pt remarks on hx of congenital heart disease. On observation, pt in mild distress. Put on 2Litres of O2, MD notified and promptly in the room assessing the pt.

## 2013-06-15 NOTE — ED Provider Notes (Addendum)
CSN: [id number]     Arrival date & time 06/15/13  2209 History   First MD Initiated Contact with Patient 06/15/13 2227     Chief Complaint  Patient presents with  . Chest Pain     (Consider location/radiation/quality/duration/timing/severity/associated sxs/prior Treatment) Patient is a 38 y.o. female presenting with chest pain. The history is provided by the patient and a relative.  Chest Pain  patient here with sudden onset of left-sided chest pain that began just prior to arrival with associated dyspnea. Pain characterized as pleuritic and persistent. Pain radiates down to her left arm. Denies any history of trauma. She denies any fever or cough. She is currently being treated for endometrial cancer and last chemotherapy treatment was 3 days ago. Notes severe pain in her legs since that treatment but denies any swelling. States that she has not been as active as she normally is. Symptoms have been progressively worse and persistent and no treatment used prior to arrival  Past Medical History  Diagnosis Date  . Diabetes mellitus without complication   . Obesity   . Seizure disorder     tonic clonic  . Pelvic pain   . Anxiety     no meds  . GERD (gastroesophageal reflux disease)     occasional no meds   . Seizures     trigger by strong odors, last seizure 1 yr ago, no meds   Past Surgical History  Procedure Laterality Date  . Abdominal hysterectomy     Family History  Problem Relation Age of Onset  . Depression Mother   . Hypertension Father   . Hypertension Sister   . Cancer Paternal Aunt     breast  . Heart disease Neg Hx   . Diabetes Sister     type 1 diabetes   History  Substance Use Topics  . Smoking status: Never Smoker   . Smokeless tobacco: Never Used  . Alcohol Use: No   OB History   Grav Para Term Preterm Abortions TAB SAB Ect Mult Living   0 0 0 0 0 0 0 0 0 0      Review of Systems  Cardiovascular: Positive for chest pain.  All other systems reviewed  and are negative.      Allergies  Amoxicillin; Flagyl; and Adhesive  Home Medications   Current Outpatient Rx  Name  Route  Sig  Dispense  Refill  . dexamethasone (DECADRON) 4 MG tablet      Take 5 tab with food 12 hr and 6 hr prior to Chemotherapy.Taxol   10 tablet   0   . docusate sodium (STOOL SOFTENER) 100 MG capsule   Oral   Take 100 mg by mouth.         . ferrous sulfate 325 (65 FE) MG tablet   Oral   Take 325 mg by mouth.         Marland Kitchen glipiZIDE (GLUCOTROL) 10 MG tablet   Oral   Take 10 mg by mouth.         Marland Kitchen LORazepam (ATIVAN) 1 MG tablet      Place 1/2- 1 tab under tongue or swallow every 6 hr as needed for nausea.  Take 1 tab the night of chemo.  Will make drowsy   20 tablet   0   . meloxicam (MOBIC) 7.5 MG tablet   Oral   Take 7.5 mg by mouth daily.         . metFORMIN (GLUCOPHAGE) 1000 MG  tablet   Oral   Take 1,000 mg by mouth.         . Multiple Vitamins-Minerals (MULTIVITAMIN WITH MINERALS) tablet   Oral   Take 1 tablet by mouth daily.         . naproxen sodium (ALEVE) 220 MG tablet   Oral   Take 220 mg by mouth 2 (two) times daily with a meal.          . ondansetron (ZOFRAN) 8 MG tablet   Oral   Take 1-2 tablets (8-16 mg total) by mouth every 12 (twelve) hours as needed for nausea or vomiting. Take 1 the am after chemo.  Will not make drowsy.   30 tablet   1   . OXcarbazepine (TRILEPTAL) 150 MG tablet   Oral   Take 150 mg by mouth daily.          Marland Kitchen oxyCODONE-acetaminophen (PERCOCET/ROXICET) 5-325 MG per tablet   Oral   Take 1 tablet by mouth every 6 (six) hours as needed.          . pantoprazole (PROTONIX) 40 MG tablet   Oral   Take 1 tablet (40 mg total) by mouth daily.   30 tablet   2   . polyethylene glycol (MIRALAX / GLYCOLAX) packet   Oral   Take 17 g by mouth daily.          BP 142/118  Pulse 139  Resp 21  SpO2 100%  LMP 02/24/2013 Physical Exam  Nursing note and vitals reviewed. Constitutional:  She is oriented to person, place, and time. She appears well-developed and well-nourished.  Non-toxic appearance. No distress.  HENT:  Head: Normocephalic and atraumatic.  Eyes: Conjunctivae, EOM and lids are normal. Pupils are equal, round, and reactive to light.  Neck: Normal range of motion. Neck supple. No tracheal deviation present. No mass present.  Cardiovascular: Regular rhythm and normal heart sounds.  Tachycardia present.  Exam reveals no gallop.   No murmur heard. Pulmonary/Chest: Effort normal and breath sounds normal. No stridor. No respiratory distress. She has no decreased breath sounds. She has no wheezes. She has no rhonchi. She has no rales.  Abdominal: Soft. Normal appearance and bowel sounds are normal. She exhibits no distension. There is no tenderness. There is no rebound and no CVA tenderness.  Musculoskeletal: Normal range of motion. She exhibits no edema and no tenderness.  Neurological: She is alert and oriented to person, place, and time. She has normal strength. No cranial nerve deficit or sensory deficit. GCS eye subscore is 4. GCS verbal subscore is 5. GCS motor subscore is 6.  Skin: Skin is warm and dry. No abrasion and no rash noted.  Psychiatric: Her speech is normal and behavior is normal. Her mood appears anxious.    ED Course  Procedures (including critical care time) Labs Review Labs Reviewed  CULTURE, BLOOD (ROUTINE X 2)  CULTURE, BLOOD (ROUTINE X 2)  CBC WITH DIFFERENTIAL  COMPREHENSIVE METABOLIC PANEL  TROPONIN I  I-STAT CHEM 8, ED  I-STAT CG4 LACTIC ACID, ED   Imaging Review No results found.   EKG Interpretation None      MDM   Final diagnoses:  None     Date: 06/15/2013  Rate: 146  Rhythm: sinus tachycardia  QRS Axis: normal  Intervals: normal  ST/T Wave abnormalities: nonspecific ST changes  Conduction Disutrbances:none  Narrative Interpretation:   Old EKG Reviewed: none available    11:49 PM Patient given IV fluids  here.  She was tachycardic and was given pain control with morphine and Ativan x2. Patient's CT scan consistent possible infectious process. Blood cultures and like to order. Lactate is elevated. Patient to be started on antibiotics.. She has remained hemodynamically stable. Heart rate has improved. Spoke with [facility] hospitalist and they will admit the patient.  CRITICAL CARE Performed by: Leota Jacobsen Total critical care time: 50 Critical care time was exclusive of separately billable procedures and treating other patients. Critical care was necessary to treat or prevent imminent or life-threatening deterioration. Critical care was time spent personally by me on the following activities: development of treatment plan with patient and/or surrogate as well as nursing, discussions with consultants, evaluation of patient's response to treatment, examination of patient, obtaining history from patient or surrogate, ordering and performing treatments and interventions, ordering and review of laboratory studies, ordering and review of radiographic studies, pulse oximetry and re-evaluation of patient's condition.   Leota Jacobsen, MD 06/15/13 [place], MD 06/15/13 [phone]

## 2013-06-16 ENCOUNTER — Inpatient Hospital Stay (HOSPITAL_COMMUNITY): Payer: No Typology Code available for payment source

## 2013-06-16 ENCOUNTER — Other Ambulatory Visit: Payer: Self-pay

## 2013-06-16 ENCOUNTER — Encounter (HOSPITAL_COMMUNITY): Payer: Self-pay | Admitting: Internal Medicine

## 2013-06-16 ENCOUNTER — Ambulatory Visit (HOSPITAL_COMMUNITY): Admit: 2013-06-16 | Payer: No Typology Code available for payment source | Admitting: Cardiovascular Disease

## 2013-06-16 ENCOUNTER — Encounter (HOSPITAL_COMMUNITY)
Admission: EM | Disposition: A | Discharge status: Home / Self Care | Payer: No Typology Code available for payment source | Source: Home / Self Care | Attending: Cardiovascular Disease

## 2013-06-16 DIAGNOSIS — M79609 Pain in unspecified limb: Secondary | ICD-10-CM

## 2013-06-16 DIAGNOSIS — K5900 Constipation, unspecified: Secondary | ICD-10-CM

## 2013-06-16 DIAGNOSIS — R0609 Other forms of dyspnea: Secondary | ICD-10-CM

## 2013-06-16 DIAGNOSIS — G40909 Epilepsy, unspecified, not intractable, without status epilepticus: Secondary | ICD-10-CM

## 2013-06-16 DIAGNOSIS — R0602 Shortness of breath: Secondary | ICD-10-CM | POA: Diagnosis present

## 2013-06-16 DIAGNOSIS — C549 Malignant neoplasm of corpus uteri, unspecified: Secondary | ICD-10-CM

## 2013-06-16 DIAGNOSIS — IMO0002 Reserved for concepts with insufficient information to code with codable children: Secondary | ICD-10-CM | POA: Diagnosis present

## 2013-06-16 DIAGNOSIS — D573 Sickle-cell trait: Secondary | ICD-10-CM

## 2013-06-16 DIAGNOSIS — IMO0001 Reserved for inherently not codable concepts without codable children: Secondary | ICD-10-CM

## 2013-06-16 DIAGNOSIS — N949 Unspecified condition associated with female genital organs and menstrual cycle: Secondary | ICD-10-CM

## 2013-06-16 DIAGNOSIS — E119 Type 2 diabetes mellitus without complications: Secondary | ICD-10-CM

## 2013-06-16 DIAGNOSIS — R079 Chest pain, unspecified: Secondary | ICD-10-CM

## 2013-06-16 DIAGNOSIS — E1165 Type 2 diabetes mellitus with hyperglycemia: Secondary | ICD-10-CM | POA: Diagnosis present

## 2013-06-16 DIAGNOSIS — I2119 ST elevation (STEMI) myocardial infarction involving other coronary artery of inferior wall: Principal | ICD-10-CM

## 2013-06-16 DIAGNOSIS — I2510 Atherosclerotic heart disease of native coronary artery without angina pectoris: Secondary | ICD-10-CM

## 2013-06-16 DIAGNOSIS — I517 Cardiomegaly: Secondary | ICD-10-CM

## 2013-06-16 DIAGNOSIS — R0989 Other specified symptoms and signs involving the circulatory and respiratory systems: Secondary | ICD-10-CM

## 2013-06-16 HISTORY — PX: PERCUTANEOUS CORONARY STENT INTERVENTION (PCI-S): SHX5485

## 2013-06-16 HISTORY — PX: LEFT HEART CATH: SHX5478

## 2013-06-16 LAB — GLUCOSE, CAPILLARY
GLUCOSE-CAPILLARY: 272 mg/dL — AB (ref 70–99)
Glucose-Capillary: 328 mg/dL — ABNORMAL HIGH (ref 70–99)
Glucose-Capillary: 225 mg/dL — ABNORMAL HIGH (ref 70–99)
Glucose-Capillary: 312 mg/dL — ABNORMAL HIGH (ref 70–99)

## 2013-06-16 LAB — CBC WITH DIFFERENTIAL/PLATELET
BASOS ABS: 0 10*3/uL (ref 0.0–0.1)
BASOS PCT: 0 % (ref 0–1)
EOS ABS: 0.2 10*3/uL (ref 0.0–0.7)
Eosinophils Relative: 3 % (ref 0–5)
HCT: 29.3 % — ABNORMAL LOW (ref 36.0–46.0)
Hemoglobin: 9.3 g/dL — ABNORMAL LOW (ref 12.0–15.0)
LYMPHS PCT: 22 % (ref 12–46)
Lymphs Abs: 1.4 10*3/uL (ref 0.7–4.0)
MCH: 19.3 pg — ABNORMAL LOW (ref 26.0–34.0)
MCHC: 31.7 g/dL (ref 30.0–36.0)
MCV: 60.7 fL — ABNORMAL LOW (ref 78.0–100.0)
Monocytes Absolute: 0.1 10*3/uL (ref 0.1–1.0)
Monocytes Relative: 1 % — ABNORMAL LOW (ref 3–12)
NEUTROS PCT: 74 % (ref 43–77)
Neutro Abs: 4.6 10*3/uL (ref 1.7–7.7)
Platelets: 262 10*3/uL (ref 150–400)
RBC: 4.83 MIL/uL (ref 3.87–5.11)
RDW: 18.1 % — ABNORMAL HIGH (ref 11.5–15.5)
WBC: 6.3 10*3/uL (ref 4.0–10.5)

## 2013-06-16 LAB — CBG MONITORING, ED: Glucose-Capillary: 275 mg/dL — ABNORMAL HIGH (ref 70–99)

## 2013-06-16 LAB — LIPID PANEL
Cholesterol: 132 mg/dL (ref 0–200)
HDL: 44 mg/dL (ref >39)
LDL Cholesterol: 72 mg/dL (ref 0–99)
TRIGLYCERIDES: 82 mg/dL (ref <150)
Total CHOL/HDL Ratio: 3 RATIO
VLDL: 16 mg/dL (ref 0–40)

## 2013-06-16 LAB — URINALYSIS, ROUTINE W REFLEX MICROSCOPIC
Bilirubin Urine: NEGATIVE
Hgb urine dipstick: NEGATIVE
Ketones, ur: NEGATIVE mg/dL
LEUKOCYTES UA: NEGATIVE
Nitrite: NEGATIVE
PROTEIN: NEGATIVE mg/dL
Specific Gravity, Urine: >1.046 — ABNORMAL HIGH (ref 1.005–1.030)
Urobilinogen, UA: 0.2 mg/dL (ref 0.0–1.0)
pH: 7 (ref 5.0–8.0)

## 2013-06-16 LAB — COMPREHENSIVE METABOLIC PANEL
ALT: 10 U/L (ref 0–35)
AST: 7 U/L (ref 0–37)
Albumin: 3.4 g/dL — ABNORMAL LOW (ref 3.5–5.2)
Alkaline Phosphatase: 126 U/L — ABNORMAL HIGH (ref 39–117)
BUN: 8 mg/dL (ref 6–23)
CALCIUM: 8.7 mg/dL (ref 8.4–10.5)
CO2: 24 meq/L (ref 19–32)
Chloride: 96 mEq/L (ref 96–112)
Creatinine, Ser: 0.42 mg/dL — ABNORMAL LOW (ref 0.50–1.10)
GLUCOSE: 341 mg/dL — AB (ref 70–99)
Potassium: 4.3 mEq/L (ref 3.7–5.3)
SODIUM: 131 meq/L — AB (ref 137–147)
Total Bilirubin: 0.8 mg/dL (ref 0.3–1.2)
Total Protein: 7.2 g/dL (ref 6.0–8.3)

## 2013-06-16 LAB — BLOOD GAS, ARTERIAL
ACID-BASE EXCESS: 0.7 mmol/L (ref 0.0–2.0)
Bicarbonate: 24 mEq/L (ref 20.0–24.0)
DRAWN BY: 317871
FIO2: 0.21 %
O2 Saturation: 98.4 %
Patient temperature: 97.9
TCO2: 22.2 mmol/L (ref 0–100)
pCO2 arterial: 34.4 mmHg — ABNORMAL LOW (ref 35.0–45.0)
pH, Arterial: 7.455 — ABNORMAL HIGH (ref 7.350–7.450)
pO2, Arterial: 106 mmHg — ABNORMAL HIGH (ref 80.0–100.0)

## 2013-06-16 LAB — TSH: TSH: 2.11 u[IU]/mL (ref 0.350–4.500)

## 2013-06-16 LAB — T3, FREE: T3, Free: 3.3 pg/mL (ref 2.3–4.2)

## 2013-06-16 LAB — TROPONIN I
Troponin I: <0.3 ng/mL (ref <0.30)
Troponin I: 0.88 ng/mL (ref <0.30)
Troponin I: 4.04 ng/mL (ref <0.30)

## 2013-06-16 LAB — MRSA PCR SCREENING: MRSA by PCR: NEGATIVE

## 2013-06-16 LAB — URINE MICROSCOPIC-ADD ON

## 2013-06-16 LAB — PROCALCITONIN: Procalcitonin: <0.1 ng/mL

## 2013-06-16 LAB — T4, FREE: Free T4: 1.38 ng/dL (ref 0.80–1.80)

## 2013-06-16 LAB — PRO B NATRIURETIC PEPTIDE: PRO B NATRI PEPTIDE: 5.6 pg/mL (ref 0–125)

## 2013-06-16 LAB — HEMOGLOBIN A1C
Hgb A1c MFr Bld: 8.8 % — ABNORMAL HIGH (ref <5.7)
Mean Plasma Glucose: 206 mg/dL — ABNORMAL HIGH (ref <117)

## 2013-06-16 LAB — KETONES, QUALITATIVE: ACETONE BLD: NEGATIVE

## 2013-06-16 SURGERY — LEFT HEART CATH
Anesthesia: LOCAL

## 2013-06-16 MED ORDER — MIDAZOLAM HCL 2 MG/2ML IJ SOLN
INTRAMUSCULAR | Status: AC
  Filled 2013-06-16: qty 2

## 2013-06-16 MED ORDER — FERROUS SULFATE 325 (65 FE) MG PO TABS
325.0000 mg | ORAL_TABLET | Freq: Three times a day (TID) | ORAL | Status: DC
  Administered 2013-06-16 – 2013-06-17 (×6): 325 mg via ORAL
  Filled 2013-06-16 (×10): qty 1

## 2013-06-16 MED ORDER — NITROGLYCERIN IN D5W 200-5 MCG/ML-% IV SOLN
INTRAVENOUS | Status: AC
  Filled 2013-06-16: qty 250

## 2013-06-16 MED ORDER — SODIUM CHLORIDE 0.9 % IV SOLN
INTRAVENOUS | Status: AC
  Administered 2013-06-16 (×2): via INTRAVENOUS

## 2013-06-16 MED ORDER — MORPHINE SULFATE 2 MG/ML IJ SOLN
INTRAMUSCULAR | Status: AC
  Filled 2013-06-16: qty 1

## 2013-06-16 MED ORDER — VANCOMYCIN HCL IN DEXTROSE 1-5 GM/200ML-% IV SOLN
1000.0000 mg | Freq: Two times a day (BID) | INTRAVENOUS | Status: DC
  Administered 2013-06-16 – 2013-06-18 (×5): 1000 mg via INTRAVENOUS
  Filled 2013-06-16 (×9): qty 200

## 2013-06-16 MED ORDER — PANTOPRAZOLE SODIUM 40 MG PO TBEC
40.0000 mg | DELAYED_RELEASE_TABLET | Freq: Every day | ORAL | Status: DC
  Administered 2013-06-16 – 2013-06-20 (×5): 40 mg via ORAL
  Filled 2013-06-16 (×5): qty 1

## 2013-06-16 MED ORDER — ENOXAPARIN SODIUM 40 MG/0.4ML ~~LOC~~ SOLN
40.0000 mg | SUBCUTANEOUS | Status: DC
  Administered 2013-06-16: 40 mg via SUBCUTANEOUS
  Filled 2013-06-16: qty 0.4

## 2013-06-16 MED ORDER — MORPHINE SULFATE 2 MG/ML IJ SOLN
INTRAMUSCULAR | Status: AC
  Administered 2013-06-16: 1 mg via INTRAVENOUS
  Filled 2013-06-16: qty 1

## 2013-06-16 MED ORDER — BIVALIRUDIN 250 MG IV SOLR
INTRAVENOUS | Status: AC
  Filled 2013-06-16: qty 250

## 2013-06-16 MED ORDER — HEPARIN BOLUS VIA INFUSION
4000.0000 [IU] | Freq: Once | INTRAVENOUS | Status: AC
  Administered 2013-06-16: 4000 [IU] via INTRAVENOUS
  Filled 2013-06-16: qty 4000

## 2013-06-16 MED ORDER — INSULIN ASPART 100 UNIT/ML ~~LOC~~ SOLN
0.0000 [IU] | Freq: Three times a day (TID) | SUBCUTANEOUS | Status: DC
Start: 2013-06-16 — End: 2013-06-18
  Administered 2013-06-16: 3 [IU] via SUBCUTANEOUS
  Administered 2013-06-16: 7 [IU] via SUBCUTANEOUS
  Administered 2013-06-16 – 2013-06-17 (×3): 5 [IU] via SUBCUTANEOUS
  Administered 2013-06-17: 7 [IU] via SUBCUTANEOUS
  Administered 2013-06-18: 5 [IU] via SUBCUTANEOUS
  Administered 2013-06-18: 7 [IU] via SUBCUTANEOUS

## 2013-06-16 MED ORDER — SENNOSIDES-DOCUSATE SODIUM 8.6-50 MG PO TABS
1.0000 | ORAL_TABLET | Freq: Every evening | ORAL | Status: DC | PRN
  Administered 2013-06-17: 1 via ORAL
  Filled 2013-06-16: qty 1

## 2013-06-16 MED ORDER — DOCUSATE SODIUM 100 MG PO CAPS
100.0000 mg | ORAL_CAPSULE | Freq: Two times a day (BID) | ORAL | Status: DC
  Administered 2013-06-16 – 2013-06-19 (×7): 100 mg via ORAL
  Filled 2013-06-16 (×11): qty 1

## 2013-06-16 MED ORDER — HYDRALAZINE HCL 20 MG/ML IJ SOLN
10.0000 mg | Freq: Once | INTRAMUSCULAR | Status: AC
  Administered 2013-06-16: 10 mg via INTRAVENOUS
  Filled 2013-06-16: qty 0.5

## 2013-06-16 MED ORDER — BIOTENE DRY MOUTH MT LIQD
15.0000 mL | Freq: Four times a day (QID) | OROMUCOSAL | Status: DC
  Administered 2013-06-16 – 2013-06-20 (×14): 15 mL via OROMUCOSAL

## 2013-06-16 MED ORDER — HEPARIN (PORCINE) IN NACL 2-0.9 UNIT/ML-% IJ SOLN
INTRAMUSCULAR | Status: AC
  Filled 2013-06-16: qty 1500

## 2013-06-16 MED ORDER — ONDANSETRON 8 MG/NS 50 ML IVPB
8.0000 mg | Freq: Three times a day (TID) | INTRAVENOUS | Status: DC | PRN
  Administered 2013-06-16: 8 mg via INTRAVENOUS
  Filled 2013-06-16: qty 8

## 2013-06-16 MED ORDER — LORAZEPAM 2 MG/ML IJ SOLN
1.0000 mg | Freq: Two times a day (BID) | INTRAMUSCULAR | Status: DC | PRN
  Administered 2013-06-16 – 2013-06-18 (×2): 1 mg via INTRAVENOUS
  Filled 2013-06-16: qty 1

## 2013-06-16 MED ORDER — HEPARIN (PORCINE) IN NACL 100-0.45 UNIT/ML-% IJ SOLN
1000.0000 [IU]/h | INTRAMUSCULAR | Status: DC
  Administered 2013-06-16: 1000 [IU]/h via INTRAVENOUS
  Filled 2013-06-16: qty 250

## 2013-06-16 MED ORDER — NITROGLYCERIN 0.4 MG SL SUBL
0.4000 mg | SUBLINGUAL_TABLET | SUBLINGUAL | Status: DC | PRN
  Administered 2013-06-16 – 2013-06-20 (×7): 0.4 mg via SUBLINGUAL
  Filled 2013-06-16 (×4): qty 1

## 2013-06-16 MED ORDER — OXYCODONE-ACETAMINOPHEN 5-325 MG PO TABS
1.0000 | ORAL_TABLET | Freq: Four times a day (QID) | ORAL | Status: DC | PRN
  Administered 2013-06-16 – 2013-06-20 (×3): 1 via ORAL
  Filled 2013-06-16 (×4): qty 1

## 2013-06-16 MED ORDER — NITROGLYCERIN 0.2 MG/ML ON CALL CATH LAB
INTRAVENOUS | Status: AC
  Filled 2013-06-16: qty 1

## 2013-06-16 MED ORDER — OXCARBAZEPINE 150 MG PO TABS
150.0000 mg | ORAL_TABLET | Freq: Every day | ORAL | Status: DC
  Administered 2013-06-16 – 2013-06-20 (×5): 150 mg via ORAL
  Filled 2013-06-16 (×5): qty 1

## 2013-06-16 MED ORDER — ADULT MULTIVITAMIN W/MINERALS CH
1.0000 | ORAL_TABLET | Freq: Every day | ORAL | Status: DC
  Administered 2013-06-16 – 2013-06-20 (×5): 1 via ORAL
  Filled 2013-06-16 (×5): qty 1

## 2013-06-16 MED ORDER — LIDOCAINE HCL (PF) 1 % IJ SOLN
INTRAMUSCULAR | Status: AC
  Filled 2013-06-16: qty 30

## 2013-06-16 MED ORDER — MORPHINE SULFATE 2 MG/ML IJ SOLN
2.0000 mg | INTRAMUSCULAR | Status: DC | PRN
  Administered 2013-06-16 (×2): 2 mg via INTRAVENOUS
  Administered 2013-06-16: 1 mg via INTRAVENOUS
  Administered 2013-06-16 (×2): 2 mg via INTRAVENOUS
  Administered 2013-06-17: 1 mg via INTRAVENOUS
  Administered 2013-06-17 – 2013-06-18 (×2): 2 mg via INTRAVENOUS
  Filled 2013-06-16 (×7): qty 1

## 2013-06-16 MED ORDER — NITROGLYCERIN IN D5W 200-5 MCG/ML-% IV SOLN
2.0000 ug/min | INTRAVENOUS | Status: DC
  Filled 2013-06-16: qty 250

## 2013-06-16 MED ORDER — LORAZEPAM 2 MG/ML IJ SOLN
INTRAMUSCULAR | Status: AC
  Filled 2013-06-16: qty 1

## 2013-06-16 MED ORDER — GLUCERNA SHAKE PO LIQD
237.0000 mL | Freq: Two times a day (BID) | ORAL | Status: DC | PRN
  Filled 2013-06-16: qty 237

## 2013-06-16 MED ORDER — POLYETHYLENE GLYCOL 3350 17 G PO PACK
17.0000 g | PACK | Freq: Every day | ORAL | Status: DC
  Administered 2013-06-16 – 2013-06-19 (×3): 17 g via ORAL
  Filled 2013-06-16 (×5): qty 1

## 2013-06-16 MED ORDER — ASPIRIN 81 MG PO CHEW
324.0000 mg | CHEWABLE_TABLET | Freq: Once | ORAL | Status: AC
  Administered 2013-06-16: 324 mg via ORAL
  Filled 2013-06-16: qty 4

## 2013-06-16 MED ORDER — GLIPIZIDE 10 MG PO TABS
10.0000 mg | ORAL_TABLET | Freq: Every day | ORAL | Status: DC
  Administered 2013-06-16 – 2013-06-18 (×3): 10 mg via ORAL
  Filled 2013-06-16 (×4): qty 1

## 2013-06-16 NOTE — Progress Notes (Signed)
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 Physicians: Nancy Marus, Jill Alexanders, Tor Netters  Appreciate notification by primary service of admission from ED last pm, after presenting with chest pain and SOB. Evaluation thus far has ruled out PE, question of pulmonary infection or inflammatory process by scan, no cardiac etiology identified thus far. She was hyperglycemic on admission but not in DKA and has not had any apparent seizure activity. She was additionally complaining of pain in LE which was likely taxol aches; the taxol aches may also be etiology of the chest symptoms, tho agree with thorough evaluation in process.    ONCOLOGY HISTORY Patient had long history of irregular, heavy vaginal bleeding and pelvic pain, evaluated at various locations since 2000. [facility] for increased pain and heavy vaginal bleeding, with CT AP showing large mass involving endometrium 12.5 x 8.0x5.2 cm and apparent left hydrosalpinx. PAP 04-03-13 had atypical squamous cells, negative high risk HPV; endometrial biopsy showed complex hyperplasia with atypia and florid papillary proliferation, ER, p53 and p16 positive. She saw Dr [name] on 04-30-13 and had robotic assisted total laparoscopic hysterectomy/BSO/bilateral pelvic and para aortic node evalustion at [facility] 05-16-2013. Pathology from [facility] [phone]) had endometrioid adenocarcinoma FIGO grade 2, invading 2 cm into myometrium where wall was 2.5 cm thick, no serosal involvement, lower uterine segment involvement present, cervical involvement present 58m depth, involvement of left fallopian tube and right ovary,lymphovascular space invasion present and extensive, all nodes negative (8 right pelvic, 6 left pelvic, 5 right periaortic, 2 left periaortic). Stains were negative for serous  component. [facility] complicated by presumed seizure, described as screaming and shaking by family, for which she was seen by neurology, with MRI negative and EEG pending. 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 Recommendation was 6 cycles of adjuvant carboplatin taxol chemotherapy given in split course with radiation. I saw her in consultation on 06-09-13 and [facility], with necessary steroid premedication. She did not keep appointment with PCP to get another glucometer prior to first chemotherapy, and did not use family member's glucometer as recommended to monitor blood sugar after the chemotherapy; she was drinking water, cranberry juice and regular soda (not as instructed). LE pain began ~ day 3, description suggesting taxol related from history now; she developed central chest pressure and SOB with left arm pain on day 5, at which time she presented to ED. She has had no gCSF to this point.   Subjective: Feeling some better than on admission, tho still chest discomfort and also some nausea. Leg pain improved today, still slightly sore. SOB better with [place] O2. Mouth a little sore tongue and palate. Still has vaginal itching and some discharge (questionably has allergy to Flagyl so did not use diflucan recently). No bleeding. No LE swelling. Nausea improved with antiemetics PTA. Bowels have not moved in several days. Thirsty now.  Objective: Vital signs in last 24 hours: Blood pressure 164/116, pulse 108, temperature 97.6 F (36.4 C), temperature source Oral, resp. rate 20, height _0  (1.676 m), weight 251 lb 8.7 oz (114.1 kg), last menstrual period 02/24/2013, SpO2 100.00%. Awake, alert, recognizes me, fully oriented and appropriate. Looks moderately uncomfortable and anxious, lying supine in bed with [place] O2 on. Cooperative,  drinking fluids.   Intake/Output from  previous day: 04/05 0701 - 04/06 0700 In: 750 [I.V.:500; IV Piggyback:250] Out: 1250 [Urine:1250] Intake/Output this shift: Total I/O In: -  Out: 550 [Urine:550]  Physical exam: Oral mucosa moist, no lesions and no thrush obvious. Mucous membranes somewhat pale. PERRL, not icteric. No JVD at 20 degrees. No supraclavicular adenopathy. Lungs clear anteriorly and laterally. Peripheral IV RUE site ok. Heart RRR without gallop, NSR on monitor. Abdomen obese, soft, not distended, quiet, not tender. Surgical incisions not remarkable. No inguinal adenopathy. LE no pitting edema, cords, tenderness. Feet warm. Moves all extremities easily in bed.   Lab Results:  Recent Labs  06/15/13 2238 06/15/13 2247 06/16/13 0330  WBC 6.7  --  6.3  HGB 10.3* 12.6 9.3*  HCT 31.5* 37.0 29.3*  PLT 373  --  262   BMET  Recent Labs  06/15/13 2238 06/15/13 2247 06/16/13 0330  NA 130* 134* 131*  K 4.0 4.0 4.3  CL 91* 97 96  CO2 21  --  24  GLUCOSE 439* 442* 341*  BUN _0 CREATININE 0.45* 0.50 0.42*  CALCIUM 10.2  --  8.7    Studies/Results: Ct Head Wo Contrast  06/16/2013   CLINICAL DATA:  Tingling and numbness left upper extremity in both lower extremities. History of seizures.  EXAM: CT HEAD WITHOUT CONTRAST  TECHNIQUE: Contiguous axial images were obtained from the base of the skull through the vertex without intravenous contrast.  COMPARISON:  09/14/2008  FINDINGS: Ventricles, cisterns and other CSF spaces are within normal. There is no mass, mass effect, shift of midline structures or acute hemorrhage. No evidence of acute infarction. Remaining bones and soft tissues are within normal.  IMPRESSION: No acute intracranial findings.   Electronically Signed   By: Marin Olp M.D.   On: 06/16/2013 01:34   Ct Angio Chest Pe W/cm &/or Wo Cm  06/15/2013   CLINICAL DATA:  Chest pain and shortness of breath. History of stage III endometrial cancer. Currently on chemotherapy.  EXAM: CT ANGIOGRAPHY CHEST  WITH CONTRAST  TECHNIQUE: Multidetector CT imaging of the chest was performed using the standard protocol during bolus administration of intravenous contrast. Multiplanar CT image reconstructions and MIPs were obtained to evaluate the vascular anatomy.  CONTRAST:  189m OMNIPAQUE IOHEXOL 350 MG/ML SOLN  COMPARISON:  CT abdomen/pelvis 03/27/2013  FINDINGS: Lungs are adequately inflated with minimal nodular opacification over the medial left lower lobe. Heart size of normal. There is no evidence of pulmonary embolism. Remaining mediastinal structures are within normal. There is no evidence of hilar, mediastinal or axillary adenopathy.  Images through the upper abdomen are notable only for mild diverticulosis of the colon. Remaining bones a soft tissues are within normal.  Review of the MIP images confirms the above findings.  IMPRESSION: No evidence of pulmonary embolism.  Minimal nonspecific nodular opacification over the medial left lower lobe which may be due to infection, inflammatory process/ drug toxicity or metastatic disease. Consider followup CT in 4-6 weeks.   Electronically Signed   By: DMarin OlpM.D.   On: 06/15/2013 23:46     Assessment/Plan: 1.Chest pain, LUE pain, SOB on admission: no PE, antibiotics begun for ? pneumonia by imaging/ no fever or cough. Taxol aches may be cause of the chest discomfort if nothing else on evaluation, and should resolve in next day or so if so. 2.IIIA grade 2 endometrioid adenocarcinoma: [facility] and first cycle  of adjuvant carboplatin taxol chemotherapy on 06-11-13. Leg pain very consistent with taxol aches, improved today. Counts still ok but not yet at nadir and will follow. I have added prn zofran 3.Poorly controlled diabetes: blood sugars likely much higher at home after steroids used with chemotherapy, did not check at home after chemo, etc. Needs glucometer at home. 4. Chronic seizure disorder: recent change in meds  with neurology evaluation at [facility] (after post op seizure) 5.difficult peripheral IV access for chemo on 06-11-13 6.social concerns: not able to afford prescriptions -- [facility] staff aware but have not met with patient yet. She is recently widowed, and has adopted 66 mo old daughter. 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 Needs social worker assistance please. 7.sickle trait by hemoglobin electrophoresis recently 8.recent trichomonas: still vaginal discomfort. Not clear if allergic to Flagyl (thus diflucan) vs amoxicillin that she took at same time, with rash. 9.mild mucositis: try biotene rinse 10. Constipation since surgery and chemo: agree with miralax.   I will follow with you.   Britteny Fiebelkorn P  [phone] Pager [phone]

## 2013-06-16 NOTE — Progress Notes (Signed)
Echocardiogram 2D Echocardiogram has been performed.  Kelly [name] 06/16/2013, 11:36 AM

## 2013-06-16 NOTE — Progress Notes (Signed)
INITIAL NUTRITION ASSESSMENT  DOCUMENTATION CODES Per approved criteria  -Obesity Unspecified   INTERVENTION: -Reviewed high fiber foods and recommended increased fluid intake for constipation -Glucerna shakes once daily as warranted -Will continue to monitor  NUTRITION DIAGNOSIS: Unintentional wt loss related to inadequate oral intake as evidenced by 20 lbs wt loss in 2 months.   Goal: Pt to meet >/= 90% of their estimated nutrition needs    Monitor:  Total protein/energy intake, labs, weights, GI profile, glucose profile  Reason for Assessment: MST  38 y.o. female  Admitting Dx: Chest pain  ASSESSMENT: Kelly [name] is a 38 y.o. female with recently diagnosed endometrial carcinoma status post surgery and had received chemotherapy last Thursday 4 days ago started developing chest pain and shortness of breath or palpitations last evening. Patient also has been having lower extremity numbness with weakness since chemotherapy and also developed left upper extremity numbness since last night  -Pt reported overall decreased appetite, early satiety, and taste changes that have occurred in past 2-3 months -Diet recall indicates pt eat 50-75% of two meals/day. Breakfast consists of eggs, toast and sometimes bacon. Her evening meal is normally a baked protein, fresh fruits and vegetables and some starch. Reported that some foods have lost their taste -Family member also noted that pt has had difficulty with constipation, which the doctor has prescribed her fluids and stool softeners. Reviewed additional high fiber foods to incorporate, along with fluids, in pt's diet -Pt's appetite may improve with regular BMs -Endorsed <20 lbs weight loss in past 2-3 months, or approximately a significant 8-9% weight loss in 3 months -Admitted with elevated CBGs. Evaluated by DM coordinator -Pt may benefit from addition of Glucerna supplement around lunch/afternoon as pt appears to be skipping  meals and would benefit from additional protein while undergoing chemo treatments -Will monitor PO intake and supplement as warranted. Encouraged heart healthy proteins  Height: Ht Readings from Last 1 Encounters:  06/15/13 5\' 6"  (1.676 m)    Weight: Wt Readings from Last 1 Encounters:  06/16/13 242 lb 12.8 oz (110.133 kg)    Ideal Body Weight: 130 lbs  % Ideal Body Weight: 186%  Wt Readings from Last 10 Encounters:  06/16/13 242 lb 12.8 oz (110.133 kg)  06/04/13 263 lb (119.296 kg)  04/30/13 259 lb 8 oz (117.708 kg)  04/14/13 264 lb (119.75 kg)  04/03/13 258 lb 8 oz (117.255 kg)  04/19/12 268 lb (121.564 kg)  04/02/12 271 lb (122.925 kg)    Usual Body Weight: 265-270 lbs  % Usual Body Weight: 91%  BMI:  Body mass index is 39.21 kg/(m^2).Obesity II  Estimated Nutritional Needs: Kcal: 2100-2300 Protein: 110-120 Fluid: >/=2100 ml/daily  Skin: WDL  Diet Order:    EDUCATION NEEDS: -No education needs identified at this time   Intake/Output Summary (Last 24 hours) at 06/16/13 1459 Last data filed at 06/16/13 1020  Gross per 24 hour  Intake    750 ml  Output   1801 ml  Net  -1051 ml    Last BM: pta   Labs:   Recent Labs Lab 06/11/13 1136 06/15/13 2238 06/15/13 2247 06/16/13 0330  NA 138 130* 134* 131*  K 4.0 4.0 4.0 4.3  CL  --  91* 97 96  CO2 21* 21  --  24  BUN 12.1 9 7 8   CREATININE 0.8 0.45* 0.50 0.42*  CALCIUM 9.5 10.2  --  8.7  GLUCOSE 420* 439* 442* 341*    CBG (last 3)  Recent Labs  06/16/13 0045 06/16/13 0748 06/16/13 1200  GLUCAP 275* 328* 272*    Scheduled Meds: . antiseptic oral rinse  15 mL Mouth Rinse QID  . ceFEPime (MAXIPIME) IV  2 g Intravenous Q12H  . docusate sodium  100 mg Oral BID  . enoxaparin (LOVENOX) injection  40 mg Subcutaneous Q24H  . ferrous sulfate  325 mg Oral TID WC  . glipiZIDE  10 mg Oral QAC breakfast  . insulin aspart  0-9 Units Subcutaneous TID WC  . multivitamin with minerals  1 tablet Oral  Daily  . OXcarbazepine  150 mg Oral Daily  . pantoprazole  40 mg Oral Daily  . polyethylene glycol  17 g Oral Daily  . vancomycin  1,000 mg Intravenous Q12H    Continuous Infusions: . sodium chloride 75 mL/hr at 06/16/13 1228    Past Medical History  Diagnosis Date  . Diabetes mellitus without complication   . Obesity   . Seizure disorder     tonic clonic  . Pelvic pain   . Anxiety     no meds  . GERD (gastroesophageal reflux disease)     occasional no meds   . Seizures     trigger by strong odors, last seizure 1 yr ago, no meds    Past Surgical History  Procedure Laterality Date  . Abdominal hysterectomy      Atlee Abide Mendon RD LDN Clinical Dietitian [phone]

## 2013-06-16 NOTE — Progress Notes (Signed)
Was called to review ECG worrisome for STEMI. ECG showing 1 mm ST segment elevations in leads II, III and aVF Patient was having 10/10 sharp chest pain. While ECG was being reviewed patient became bradycardic. Code STEMI was called. Spoke to Dr. Claiborne Billings. Repeat ECG showing developing inferior STEMI. Cath lab activated. Patient was given 4 of Aspirin and Heparin bolus followed by heparin gtt. Patient is being transferred to [facility] cath lab. Patient has become transiently hypotensive which improved after NS bolus.  Kelly [name] 11:11 PM

## 2013-06-16 NOTE — Progress Notes (Addendum)
Troponin level 0.88. Dr. Charlies Silvers called- pt ordered to transfer to Cardiac unit per Dr. Charlies Silvers. Report called to [place]. Transferred via bed. O2@ 2L via [place].

## 2013-06-16 NOTE — Progress Notes (Signed)
[facility] PROGRESS NOTE  Kelly [name] SEG:[id number] DOB: June 11, 1975 DOA: 06/15/2013 PCP: [name], PA-C  Brief narrative: 38 y.o. 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 On admission, blood pressure was 142/118, heart rate 105 - 139, Tmax 98.2 F, oxygen saturation 100%. CT angio chest ruled out pulmonary embolism but did show some features concerning for possible pneumonia/inflammatory process versus drug toxicity or metastatic process.  Assessment/Plan:  Principal Problem:   Chest pain, shortness of breath and palpitations - Cardiac enzymes for total of 3 sets are negative. We will followup on 2-D echo, BNP; TSH is within normal limits - CT angio chest ruled out pulmonary embolism but it was concerning for possible infectious and/or inflammatory process or toxicity or metastatic process.  - Patient continues to have chest pain but the pain is relieved with combination of morphine 2 mg IV every 4 hours as needed and oxycodone every 6 hours as needed by mouth  - Continue vancomycin and cefepime for possible pneumonia/postobstructive pneumonia/ healthcare associated pneumonia  - Followup on blood culture results  Active Problems:  HCAP (healthcare-associated pneumonia) - As mentioned above, continue vancomycin and cefepime. Followup blood culture results. - Oxygen support via nasal cannula to keep oxygen saturation above 90%  SEIZURE DISORDER - Continue Trileptal 150 mg daily    Diabetes mellitus type 2, uncontrolled - Followup hemoglobin A1c  - Continue glipizide and sliding scale insulin    Iron deficiency anemia - Continue ferrous sulfate supplementation    Code Status: full code  Family  Communication:  no family at the bedside  Disposition Plan:  remains inpatient   Leisa Lenz, MD  [facility] Pager [phone]  If 7PM-7AM, please contact night-coverage [email or web address] Password [email or web address] 06/16/2013, 10:17 AM   LOS: 1 day   Consultants:   None   Procedures:   none   Antibiotics:  Vancomycin and cefepime started 06/15/2013 -->  HPI/Subjective: Still has complaints of chest pain.  Objective: Filed Vitals:   06/16/13 0300 06/16/13 0400 06/16/13 0500 06/16/13 0800  BP:  154/112  164/116  Pulse: 105 114 105 108  Temp:  98.2 F (36.8 C)  97.6 F (36.4 C)  TempSrc:  Oral  Oral  Resp: 21 26 16 20   Height:      Weight:  114.1 kg (251 lb 8.7 oz)    SpO2: 100% 100% 100% 100%    Intake/Output Summary (Last 24 hours) at 06/16/13 1017 Last data filed at 06/16/13 0800  Gross per 24 hour  Intake    750 ml  Output   1800 ml  Net  -1050 ml    Exam:   General:  Pt is alert, follows commands appropriately, not in acute distress  Cardiovascular: Regular rate and rhythm, S1/S2 appreciated  Respiratory: Clear to auscultation bilaterally, no wheezing, no crackles, no rhonchi  Abdomen:  firm, obese , non tender, non distended, bowel sounds present, no guarding  Extremities: No edema, pulses DP and PT palpable bilaterally  Neuro: Grossly nonfocal  Data Reviewed: Basic Metabolic Panel:  Recent Labs Lab 06/11/13 1136 06/15/13 2238 06/15/13 2247 06/16/13 0330  NA 138 130* 134* 131*  K 4.0 4.0 4.0 4.3  CL  --  91* 97 96  CO2 21* 21  --  24  GLUCOSE 420* 439* 442*  341*  BUN 12.1 9 7 8   CREATININE 0.8 0.45* 0.50 0.42*  CALCIUM 9.5 10.2  --  8.7   Liver Function Tests:  Recent Labs Lab 06/11/13 1136 06/15/13 2238 06/16/13 0330  AST 10 7 7   ALT 11 12 10   ALKPHOS 162* 141* 126*  BILITOT 0.57 0.7 0.8  PROT 8.4* 8.3 7.2  ALBUMIN 3.6 3.7 3.4*   No results found for this basename: LIPASE, AMYLASE,  in the last 168 hours No results found for  this basename: AMMONIA,  in the last 168 hours CBC:  Recent Labs Lab 06/11/13 1132 06/15/13 2238 06/15/13 2247 06/16/13 0330  WBC 14.5* 6.7  --  6.3  NEUTROABS 13.6* 4.2  --  4.6  HGB 9.6* 10.3* 12.6 9.3*  HCT 31.2* 31.5* 37.0 29.3*  MCV 61.0* 59.2*  --  60.7*  PLT 461* 373  --  262   Cardiac Enzymes:  Recent Labs Lab 06/15/13 2238 06/16/13 0330 06/16/13 0810  TROPONINI <0.30 <0.30 <0.30   BNP: No components found with this basename: POCBNP,  CBG:  Recent Labs Lab 06/16/13 0045 06/16/13 0748  GLUCAP 275* 328*    Recent Results (from the past 240 hour(s))  MRSA PCR SCREENING     Status: None   Collection Time    06/16/13  2:04 AM      Result Value Ref Range Status   MRSA by PCR NEGATIVE  NEGATIVE Final     Studies: Ct Head Wo Contrast 06/16/2013   IMPRESSION: No acute intracranial findings.    Ct Angio Chest Pe W/cm &/or Wo Cm in 06/15/2013    IMPRESSION: No evidence of pulmonary embolism.  Minimal nonspecific nodular opacification over the medial left lower lobe which may be due to infection, inflammatory process/ drug toxicity or metastatic disease. Consider followup CT in 4-6 weeks.    Scheduled Meds: . antiseptic oral rinse  15 mL Mouth Rinse QID  . ceFEPime (MAXIPIME) IV  2 g Intravenous Q12H  . docusate sodium  100 mg Oral BID  . enoxaparin (LOVENOX) injection  40 mg Subcutaneous Q24H  . ferrous sulfate  325 mg Oral TID WC  . glipiZIDE  10 mg Oral QAC breakfast  . insulin aspart  0-9 Units Subcutaneous TID WC  . multivitamin  1 tablet Oral Daily  . OXcarbazepine  150 mg Oral Daily  . pantoprazole  40 mg Oral Daily  . polyethylene glycol  17 g Oral Daily  . vancomycin  1,000 mg Intravenous Q12H   Continuous Infusions: . sodium chloride 125 mL/hr at 06/16/13 0200

## 2013-06-16 NOTE — Progress Notes (Signed)
ANTIBIOTIC CONSULT NOTE - INITIAL  Pharmacy Consult for Vancomycin, cefepime  Indication: pneumonia  Allergies  Allergen Reactions  . Amoxicillin Nausea And Vomiting, Swelling and Rash    Rash, vomiting, throat closing Rash, vomiting, throat closing  . Flagyl [Metronidazole] Nausea And Vomiting    04/30/13-Per pt she is unable to tolerate this medication, causes rash 04/30/13-Per pt she is unable to tolerate this medication, causes rash  . Adhesive [Tape] Rash and Dermatitis    "takes skin off" "takes skin off"    Patient Measurements: Height: 5\' 6"  (167.6 cm) Weight: 263 lb (119.296 kg) IBW/kg (Calculated) : 59.3 Adjusted Body Weight:   Vital Signs: Temp: 97.8 F (36.6 C) (04/06 0058) Temp src: Rectal (04/05 2253) BP: 142/118 mmHg (04/05 2225) Pulse Rate: 139 (04/05 2225) Intake/Output from previous day:   Intake/Output from this shift:    Labs:  Recent Labs  06/15/13 2238 06/15/13 2247  WBC 6.7  --   HGB 10.3* 12.6  PLT 373  --   CREATININE 0.45* 0.50   Estimated Creatinine Clearance: 126.6 ml/min (by C-G formula based on Cr of 0.5). No results found for this basename: VANCOTROUGH, Corlis Leak, VANCORANDOM, [place], GENTPEAK, GENTRANDOM, TOBRATROUGH, TOBRAPEAK, TOBRARND, AMIKACINPEAK, AMIKACINTROU, AMIKACIN,  in the last 72 hours   Microbiology: Recent Results (from the past 720 hour(s))  URINE CULTURE     Status: None   Collection Time    06/04/13  4:56 PM      Result Value Ref Range Status   Urine Culture, Routine Culture, Urine   Final   Comment: ------------------------------------------------------------------------     CCU     Final - ===== COLONY COUNT: =====     NO GROWTH     NO GROWTH    Medical History: Past Medical History  Diagnosis Date  . Diabetes mellitus without complication   . Obesity   . Seizure disorder     tonic clonic  . Pelvic pain   . Anxiety     no meds  . GERD (gastroesophageal reflux disease)     occasional no meds    . Seizures     trigger by strong odors, last seizure 1 yr ago, no meds    Medications:  Anti-infectives   Start     Dose/Rate Route Frequency Ordered Stop   06/16/13 0800  vancomycin (VANCOCIN) IVPB 1000 mg/200 mL premix     1,000 mg 200 mL/hr over 60 Minutes Intravenous Every 12 hours 06/16/13 0214     06/16/13 0000  vancomycin (VANCOCIN) IVPB 1000 mg/200 mL premix     1,000 mg 200 mL/hr over 60 Minutes Intravenous  Once 06/15/13 2356 06/16/13 0213   06/16/13 0000  ceFEPIme (MAXIPIME) 2 g in dextrose 5 % 50 mL IVPB     2 g 100 mL/hr over 30 Minutes Intravenous Every 12 hours 06/15/13 2356       Assessment: Patient with HCAP.  First dose of antibiotics already given.  Goal of Therapy:  Vancomycin trough level 15-20 mcg/ml Cefepime dosed based on patient weight and renal function   Plan:  Measure antibiotic drug levels at steady state Follow up culture results Vancomycin 1gm iv q12hr Cefepime 2gm iv q12hr  Nani Skillern Crowford 06/16/2013,2:14 AM

## 2013-06-16 NOTE — Progress Notes (Signed)
[id number]/Shalie Schremp, RN, BSN, CCM  [phone]  Chart Reviewed for discharge and hospital needs.  Discharge needs at time of review: None present will follow for needs.  Review of patient progress due on [id number]. 

## 2013-06-16 NOTE — Progress Notes (Signed)
Inpatient Diabetes Program Recommendations  AACE/ADA: New Consensus Statement on Inpatient Glycemic Control (2013)  Target Ranges:  Prepandial:   less than 140 mg/dL      Peak postprandial:   less than 180 mg/dL (1-2 hours)      Critically ill patients:  140 - 180 mg/dL   Reason for Visit: Hyperglycema Diabetes history: DM2 Outpatient Diabetes medications: glipizide 10 mg QAM and metformin 1000 mg QAM Current orders for Inpatient glycemic control: Glipizide 10 mg QAM, Novolog moderate tidwc  Inpatient Diabetes Program Recommendations Insulin - Basal: Start basal insulin - Lantus 15 units QHS Correction (SSI): Increase Novolog to moderate tidwc and hs Insulin - Meal Coverage: May need small amount of meal coverage insulin Oral Agents: D/C oral agents while in hospital HgbA1C: 8.8% - uncontrolled Outpatient Referral: OP Diabetes Education consult for uncontrolled DM  Note: MD - please advise if pt is to go home on insulin. If so, will order Insulin Starter Kit and RN to begin teaching insulin administration.  Thank you. Lorenda Peck, RD, LDN, CDE Inpatient Diabetes Coordinator [phone]

## 2013-06-16 NOTE — H&P (Addendum)
[facility] History and Physical  Kelly [name] SWF:[id number] DOB: 02-09-1976 DOA: 06/15/2013  Referring physician: ER physician. PCP: Kelly Oxford, PA-C   Chief Complaint: Chest pain and shortness of breath.  HPI: Kelly [name] is a 38 y.o. female with recently diagnosed endometrial carcinoma status post surgery and had received chemotherapy last Thursday 4 days ago started developing chest pain and shortness of breath or palpitations last evening. Patient also has been having lower extremity numbness with weakness since chemotherapy and also developed left upper extremity numbness since last night. In the ER patient was found to be very tachycardic and had CT angiogram of the chest which was negative for pulmonary embolism and had shown features concerning for possible pneumonia, inflammatory process versus drug toxicity versus metastatic process. Patient denies having any productive cough fever chills. Has some nausea. Denies any abdominal pain diarrhea. Since surgery patient has been having some vaginal discharge which patient states her surgeon has stated was normal. Patient has been empirically started on antibiotics for possible pneumonia and admitted for further management. On exam patient is able to move all extremities. Denies any incontinence of urine or bowel.  Review of Systems: As presented in the history of presenting illness, rest negative.  Past Medical History  Diagnosis Date  . Diabetes mellitus without complication   . Obesity   . Seizure disorder     tonic clonic  . Pelvic pain   . Anxiety     no meds  . GERD (gastroesophageal reflux disease)     occasional no meds   . Seizures     trigger by strong odors, last seizure 1 yr ago, no meds   Past Surgical History  Procedure Laterality Date  . Abdominal hysterectomy     Social History:  reports that she has never smoked. She has never used smokeless tobacco. She reports that she does  not drink alcohol or use illicit drugs. Where does patient live home. Can patient participate in ADLs? Yes.  Allergies  Allergen Reactions  . Amoxicillin Nausea And Vomiting, Swelling and Rash    Rash, vomiting, throat closing Rash, vomiting, throat closing  . Flagyl [Metronidazole] Nausea And Vomiting    04/30/13-Per pt she is unable to tolerate this medication, causes rash 04/30/13-Per pt she is unable to tolerate this medication, causes rash  . Adhesive [Tape] Rash and Dermatitis    "takes skin off" "takes skin off"    Family History:  Family History  Problem Relation Age of Onset  . Depression Mother   . Hypertension Father   . Hypertension Sister   . Cancer Paternal Aunt     breast  . Heart disease Neg Hx   . Diabetes Sister     type 1 diabetes      Prior to Admission medications   Medication Sig Start Date End Date Taking? Authorizing Provider  dexamethasone (DECADRON) 4 MG tablet Take 5 tab with food 12 hr and 6 hr prior to Chemotherapy.Taxol 06/04/13   Lennis Marion Downer, MD  docusate sodium (STOOL SOFTENER) 100 MG capsule Take 100 mg by mouth. 05/18/13   Historical Provider, MD  ferrous sulfate 325 (65 FE) MG tablet Take 325 mg by mouth.    Historical Provider, MD  glipiZIDE (GLUCOTROL) 10 MG tablet Take 10 mg by mouth.    Historical Provider, MD  LORazepam (ATIVAN) 1 MG tablet Place 1/2- 1 tab under tongue or swallow every 6 hr as needed for nausea.  Take 1 tab the night of  chemo.  Will make drowsy 06/04/13   Lennis Marion Downer, MD  meloxicam (MOBIC) 7.5 MG tablet Take 7.5 mg by mouth daily.    Historical Provider, MD  metFORMIN (GLUCOPHAGE) 1000 MG tablet Take 1,000 mg by mouth.    Historical Provider, MD  Multiple Vitamins-Minerals (MULTIVITAMIN WITH MINERALS) tablet Take 1 tablet by mouth daily.    Historical Provider, MD  naproxen sodium (ALEVE) 220 MG tablet Take 220 mg by mouth 2 (two) times daily with a meal.     Historical Provider, MD  ondansetron (ZOFRAN) 8 MG  tablet Take 1-2 tablets (8-16 mg total) by mouth every 12 (twelve) hours as needed for nausea or vomiting. Take 1 the am after chemo.  Will not make drowsy. 06/04/13   Lennis Marion Downer, MD  OXcarbazepine (TRILEPTAL) 150 MG tablet Take 150 mg by mouth daily.  05/18/13 05/18/14  Historical Provider, MD  oxyCODONE-acetaminophen (PERCOCET/ROXICET) 5-325 MG per tablet Take 1 tablet by mouth every 6 (six) hours as needed.  05/18/13   Historical Provider, MD  pantoprazole (PROTONIX) 40 MG tablet Take 1 tablet (40 mg total) by mouth daily. 06/04/13   Lennis Marion Downer, MD  polyethylene glycol (MIRALAX / GLYCOLAX) packet Take 17 g by mouth daily.    Historical Provider, MD    Physical Exam: Filed Vitals:   06/15/13 2246 06/15/13 2253 06/15/13 2300 06/16/13 0058  BP:      Pulse:      Temp: 97.6 F (36.4 C) 97.7 F (36.5 C)  97.8 F (36.6 C)  TempSrc: Oral Rectal    Resp:      Height:   5\' 6"  (1.676 m)   Weight:   119.296 kg (263 lb)   SpO2:         General:  Well-developed and nourished.  Eyes: Anicteric no pallor.  ENT: No discharge from the ears eyes nose mouth.  Neck: No mass felt.  Cardiovascular: S1-S2 heard. Tachycardic.  Respiratory: No rhonchi or crepitations.  Abdomen: Soft nontender bowel present.  Skin: No rash.  Musculoskeletal: No edema.  Psychiatric: Appears normal.  Neurologic: Alert awake oriented to time place and person. Moves all extremities 5 x 5. No facial asymmetry. Tongue is midline.  Labs on Admission:  Basic Metabolic Panel:  Recent Labs Lab 06/11/13 1136 06/15/13 2238 06/15/13 2247  NA 138 130* 134*  K 4.0 4.0 4.0  CL  --  91* 97  CO2 21* 21  --   GLUCOSE 420* 439* 442*  BUN 12.1 9 7   CREATININE 0.8 0.45* 0.50  CALCIUM 9.5 10.2  --    Liver Function Tests:  Recent Labs Lab 06/11/13 1136 06/15/13 2238  AST 10 7  ALT 11 12  ALKPHOS 162* 141*  BILITOT 0.57 0.7  PROT 8.4* 8.3  ALBUMIN 3.6 3.7   No results found for this basename: LIPASE,  AMYLASE,  in the last 168 hours No results found for this basename: AMMONIA,  in the last 168 hours CBC:  Recent Labs Lab 06/11/13 1132 06/15/13 2238 06/15/13 2247  WBC 14.5* 6.7  --   NEUTROABS 13.6* 4.2  --   HGB 9.6* 10.3* 12.6  HCT 31.2* 31.5* 37.0  MCV 61.0* 59.2*  --   PLT 461* 373  --    Cardiac Enzymes:  Recent Labs Lab 06/15/13 2238  TROPONINI <0.30    BNP (last 3 results) No results found for this basename: PROBNP,  in the last 8760 hours CBG:  Recent Labs Lab 06/16/13 0045  GLUCAP 275*    Radiological Exams on Admission: Ct Angio Chest Pe W/cm &/or Wo Cm  06/15/2013   CLINICAL DATA:  Chest pain and shortness of breath. History of stage III endometrial cancer. Currently on chemotherapy.  EXAM: CT ANGIOGRAPHY CHEST WITH CONTRAST  TECHNIQUE: Multidetector CT imaging of the chest was performed using the standard protocol during bolus administration of intravenous contrast. Multiplanar CT image reconstructions and MIPs were obtained to evaluate the vascular anatomy.  CONTRAST:  116mL OMNIPAQUE IOHEXOL 350 MG/ML SOLN  COMPARISON:  CT abdomen/pelvis 03/27/2013  FINDINGS: Lungs are adequately inflated with minimal nodular opacification over the medial left lower lobe. Heart size of normal. There is no evidence of pulmonary embolism. Remaining mediastinal structures are within normal. There is no evidence of hilar, mediastinal or axillary adenopathy.  Images through the upper abdomen are notable only for mild diverticulosis of the colon. Remaining bones a soft tissues are within normal.  Review of the MIP images confirms the above findings.  IMPRESSION: No evidence of pulmonary embolism.  Minimal nonspecific nodular opacification over the medial left lower lobe which may be due to infection, inflammatory process/ drug toxicity or metastatic disease. Consider followup CT in 4-6 weeks.   Electronically Signed   By: Marin Olp M.D.   On: 06/15/2013 23:46    EKG: Independently  reviewed. Sinus tachycardia.  Assessment/Plan Principal Problem:   Chest pain Active Problems:   SEIZURE DISORDER   HCAP (healthcare-associated pneumonia)   Shortness of breath   Diabetes mellitus type 2, uncontrolled   1. Chest pain with shortness of breath - differentials include pneumonia, metastasis, drug toxicity versus inflammatory process. At this time patient has been empirically placed on vancomycin and cefepime for health care associated pneumonia. Follow blood cultures. Closely follow respiratory status. Cycle cardiac markers and check 2-D echo.  2. Sinus tachycardia - follow blood cultures to rule out sepsis, check thyroid function test to rule out hyperthyroidism. Check 2-D echo. Continue with IV hydration. If patient remains tachycardic consider CT abdomen and pelvis to rule out any intra-abdominal infection given that patient has had recent surgery. 3. Diabetes mellitus type 2 uncontrolled - patient does have mild elevated anion gap. I have ordered repeat metabolic panel stat along with acetone levels and ABG and if there is signs of DKA then I will start IV insulin infusion. Closely follow CBGs. Patient was recently on dexamethasone after chemotherapy which probably increased patient's blood sugar. 4. Tingling and numbness of extremities - CT head is negative for anything acute. Patient is nonfocal on exam. I have discussed with on-call neurologist Dr. Leonel Ramsay and at this time patient symptoms felt to be secondary to patient's chemotherapy Taxol. 5. Seizure disorder - patient has had recent seizure last month prior to surgery. Continue Trileptal. 6. Endometrial carcinoma - per oncologist. Patient does have tingling and numbness from possible chemotherapy-induced. And also patient's CT chest shows possible metastasis which has to be addressed by oncologist. 7. Chronic anemia - follow CBC.    Code Status: Full code.  Family Communication: Patient's sister.  Disposition Plan:  Admit to inpatient.    KAKRAKANDY,ARSHAD N. [facility] Pager [phone].  If 7PM-7AM, please contact night-coverage [email or web address] Password [email or web address] 06/16/2013, 1:22 AM

## 2013-06-16 NOTE — Progress Notes (Signed)
CRITICAL VALUE ALERT  Critical value received:  Troponin 0.88  Date of notification:  06/16/2013   Time of notification:  5916  Critical value read back:yes  Nurse who received alert:  Thedora Hinders, RN who then gave to Zandra Abts, RN  MD notified (1st page):  Dr. Charlies Silvers  Time of first page:  1633  MD notified (2nd page):  Time of second page:4:52 PM   Responding MD:  Dr. Charlies Silvers  Time MD responded:  4:52 PM

## 2013-06-17 DIAGNOSIS — I213 ST elevation (STEMI) myocardial infarction of unspecified site: Secondary | ICD-10-CM

## 2013-06-17 DIAGNOSIS — E669 Obesity, unspecified: Secondary | ICD-10-CM

## 2013-06-17 DIAGNOSIS — D500 Iron deficiency anemia secondary to blood loss (chronic): Secondary | ICD-10-CM

## 2013-06-17 DIAGNOSIS — J189 Pneumonia, unspecified organism: Secondary | ICD-10-CM

## 2013-06-17 DIAGNOSIS — K219 Gastro-esophageal reflux disease without esophagitis: Secondary | ICD-10-CM

## 2013-06-17 DIAGNOSIS — I219 Acute myocardial infarction, unspecified: Secondary | ICD-10-CM

## 2013-06-17 HISTORY — DX: ST elevation (STEMI) myocardial infarction of unspecified site: I21.3

## 2013-06-17 HISTORY — PX: CARDIAC CATHETERIZATION: SHX172

## 2013-06-17 LAB — BASIC METABOLIC PANEL
BUN: 6 mg/dL (ref 6–23)
CHLORIDE: 97 meq/L (ref 96–112)
CO2: 23 meq/L (ref 19–32)
Calcium: 8.5 mg/dL (ref 8.4–10.5)
Creatinine, Ser: 0.35 mg/dL — ABNORMAL LOW (ref 0.50–1.10)
GFR calc Af Amer: >90 mL/min (ref >90)
GFR calc non Af Amer: >90 mL/min (ref >90)
GLUCOSE: 293 mg/dL — AB (ref 70–99)
Potassium: 4.3 mEq/L (ref 3.7–5.3)
SODIUM: 136 meq/L — AB (ref 137–147)

## 2013-06-17 LAB — CBC
HCT: 28.3 % — ABNORMAL LOW (ref 36.0–46.0)
HEMOGLOBIN: 9.4 g/dL — AB (ref 12.0–15.0)
MCH: 19.9 pg — ABNORMAL LOW (ref 26.0–34.0)
MCHC: 33.2 g/dL (ref 30.0–36.0)
MCV: 60 fL — ABNORMAL LOW (ref 78.0–100.0)
PLATELETS: 314 10*3/uL (ref 150–400)
RBC: 4.72 MIL/uL (ref 3.87–5.11)
RDW: 18.2 % — ABNORMAL HIGH (ref 11.5–15.5)
WBC: 9.9 10*3/uL (ref 4.0–10.5)

## 2013-06-17 LAB — TROPONIN I: Troponin I: 15.99 ng/mL (ref <0.30)

## 2013-06-17 LAB — GLUCOSE, CAPILLARY
GLUCOSE-CAPILLARY: 319 mg/dL — AB (ref 70–99)
GLUCOSE-CAPILLARY: 382 mg/dL — AB (ref 70–99)
Glucose-Capillary: 258 mg/dL — ABNORMAL HIGH (ref 70–99)
Glucose-Capillary: 294 mg/dL — ABNORMAL HIGH (ref 70–99)

## 2013-06-17 LAB — POCT ACTIVATED CLOTTING TIME: ACTIVATED CLOTTING TIME: 509 s

## 2013-06-17 LAB — MRSA PCR SCREENING: MRSA by PCR: NEGATIVE

## 2013-06-17 MED ORDER — BIVALIRUDIN 250 MG IV SOLR
INTRAVENOUS | Status: AC
  Filled 2013-06-17: qty 250

## 2013-06-17 MED ORDER — NITROGLYCERIN IN D5W 200-5 MCG/ML-% IV SOLN: 2.0000 ug/min | INTRAVENOUS | Status: DC

## 2013-06-17 MED ORDER — TIROFIBAN HCL IV 12.5 MG/250 ML
INTRAVENOUS | Status: AC
  Filled 2013-06-17: qty 250

## 2013-06-17 MED ORDER — SODIUM CHLORIDE 0.9 % IV SOLN: 0.2500 mg/kg/h | INTRAVENOUS | Status: AC

## 2013-06-17 MED ORDER — LISINOPRIL 5 MG PO TABS
5.0000 mg | ORAL_TABLET | Freq: Every day | ORAL | Status: DC
  Administered 2013-06-17 – 2013-06-18 (×2): 5 mg via ORAL
  Filled 2013-06-17 (×2): qty 1

## 2013-06-17 MED ORDER — TICAGRELOR 90 MG PO TABS
90.0000 mg | ORAL_TABLET | Freq: Two times a day (BID) | ORAL | Status: DC
  Administered 2013-06-17 – 2013-06-20 (×7): 90 mg via ORAL
  Filled 2013-06-17 (×8): qty 1

## 2013-06-17 MED ORDER — SODIUM CHLORIDE 0.9 % IV SOLN
INTRAVENOUS | Status: DC
  Administered 2013-06-17: 08:00:00 via INTRAVENOUS

## 2013-06-17 MED ORDER — ASPIRIN EC 81 MG PO TBEC
81.0000 mg | DELAYED_RELEASE_TABLET | Freq: Every day | ORAL | Status: DC
  Administered 2013-06-17 – 2013-06-20 (×4): 81 mg via ORAL
  Filled 2013-06-17 (×4): qty 1

## 2013-06-17 MED ORDER — METOPROLOL TARTRATE 25 MG PO TABS
25.0000 mg | ORAL_TABLET | Freq: Two times a day (BID) | ORAL | Status: DC
  Administered 2013-06-17 – 2013-06-18 (×4): 25 mg via ORAL
  Filled 2013-06-17 (×5): qty 1

## 2013-06-17 MED ORDER — ATORVASTATIN CALCIUM 80 MG PO TABS
80.0000 mg | ORAL_TABLET | Freq: Every day | ORAL | Status: DC
  Administered 2013-06-17 – 2013-06-19 (×3): 80 mg via ORAL
  Filled 2013-06-17 (×4): qty 1

## 2013-06-17 MED ORDER — TICAGRELOR 90 MG PO TABS
ORAL_TABLET | ORAL | Status: AC
  Filled 2013-06-17: qty 2

## 2013-06-17 MED ORDER — TIROFIBAN HCL IV 5 MG/100ML
0.1500 ug/kg/min | INTRAVENOUS | Status: AC
  Administered 2013-06-17 (×2): 0.15 ug/kg/min via INTRAVENOUS
  Filled 2013-06-17 (×4): qty 100

## 2013-06-17 MED FILL — Medication: Qty: 1 | Status: AC

## 2013-06-17 MED FILL — Sodium Chloride IV Soln 0.9%: INTRAVENOUS | Qty: 50 | Status: AC

## 2013-06-17 NOTE — Progress Notes (Signed)
SLP Cancellation Note  Patient Details Name: Kelly [name] MRN: [id number] DOB: 04/25/1975   Cancelled treatment:       Reason Eval/Treat Not Completed: Patient at procedure or test/unavailable. Pt receiving nursing care. Pt's CT is negative, stroke symptoms identified as chemo related. Doubt pt in need of cognitive linguistic eval at this time. SLP will discontinue order. Please reorder if needed.   Gregg Holster, Katherene Ponto 06/17/2013, 10:54 AM

## 2013-06-17 NOTE — Progress Notes (Signed)
MEDICAL ONCOLOGY 06/17/2013, 9:53 AM  Hospital day 3 Antibiotics: day 3 cefepime/ vancomycin begun for possible pneumonia by imaging Chemotherapy: day 7 cycle 1 carboplatin taxol (no gCSF) for recently diagnosed IIIA endometrial carcinoma  Physicians: Jenell Milliner, [place]   Events of last PM reviewed in EMR: transferred out of [facility] stepdown to [facility] oncology unit yesterday afternoon, then troponin 0.88 and was moved to [facility] cardiac unit. Acute STEMI about MN, transferred to [facility] with cath by Dr Verl Blalock, RCA with extensive thrombus, stent placed.  Brother and sister here during night, not in room presently.  Subjective: Awake and alert, lying supine with [place] O2. Reported chest pressure to RN earlier, now complains of chest pain. No pain in left arm and no nausea now. No SOB. No longer any aching in legs (taxol related). No BM in 3 days. Is sipping liquids.  Objective: Vital signs in last 24 hours: Blood pressure 123/79, pulse 102, temperature 97.8 F (36.6 C), temperature source Oral, resp. rate 32, height 5\' 6"  (1.676 m), weight 246 lb 0.5 oz (111.6 kg), last menstrual period 02/24/2013, SpO2 100.00%.   Intake/Output from previous day: 04/06 0701 - 04/07 0700 In: 241.5 [P.O.:240; I.V.:1.5] Out: 2401 [Urine:2400; Emesis/NG output:1] Intake/Output this shift: Total I/O In: 409 [P.O.:240; I.V.:169] Out: -   Physical exam: Not diaphoretic, respirations not labored. Speech fluent and appropriate. Looks a little anxious. Oral mucosa moist. PERRL. Peripheral IVs infusing UE. Lungs clear anteriorly and laterally. Heart RRR, sinus on monitor. Abdomen obese, soft, nontender, quiet. Foley in with clear yellow urine. LE without edema, cords.  Lab Results:  Recent Labs  06/16/13 0330 06/17/13 0418  WBC 6.3 9.9  HGB 9.3* 9.4*  HCT 29.3* 28.3*  PLT 262 314   BMET  Recent Labs  06/16/13 0330 06/17/13 0418  NA 131* 136*  K 4.3 4.3  CL 96 97   CO2 24 23  GLUCOSE 341* 293*  BUN 8 6  CREATININE 0.42* 0.35*  CALCIUM 8.7 8.5    Studies/Results: No new imaging studies past 24 hours   Assessment/Plan: 1. 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 Cardiology managing on heart unit [facility]. 2. Endometrial carcinoma: presentation and diagnosis Jan 2015, post robotic assisted laparoscopic TAH BSO and bilateral pelvic and periaortic node evaluation by Dr Nancy Marus at [facility] 05-16-2013 IIIA grade 2. Had first adjuvant taxol carboplatin at [facility] 06-11-13. Blood counts still good, not yet at nadir. Has not had gCSF. Will follow counts daily now, and add gCSF if WBC appears to be dropping significantly. Taxol aches in legs resolved, no nausea now.  2.Diabetes: poorly controlled PTA. Needs home glucometer. 3.Seizure disorder since age 31: may have had post op seizure at [facility], seen then by [facility] neurology and begun on trileptal 4.GERD: recently began protonix 5.Sickle trait: found on recent hemoglobin electrophoresis. 6. Iron deficiency anemia related to heavy gyn blood loss, oral iron begun recently 7.Obesity: intentional weight loss of 100 lbs since 2012, mostly with exercise.  8. Peripheral IV access difficult with first chemo 9.Social concerns: recently widowed, I believe shortly after adopting now 75 month old daughter. Mother and sister assisting with the infant.  Will follow with you, and I will let gyn oncology know of situation.  Gordy Levan MD [phone] Pager [phone]

## 2013-06-17 NOTE — CV Procedure (Signed)
Kelly [name] is a 38 y.o. female    [id number]  [id number] LOCATION:  FACILITY: [place]  PHYSICIAN: Troy Sine, MD, [facility] 1975-09-12   DATE OF PROCEDURE:  06/17/2013    EMERGENCY CARDIAC CATHETERIZATION/PERCUTANEOUS CORONARY INTERVENTION     HISTORY:   Kelly [name] is a 38 year old female who has a history of endometrial carcinoma and one month ago underwent total hysterectomy and received chemotherapy one week ago. She has a history of diabetes mellitus, morbid obesity, long-standing history of hypertension, and yesterday presented to [facility] and ultimately was discharged home. Tonight she developed chest discomfort. Rep. [facility] long emergency room. Her ECG suggested inferior ST segment elevation myocardial infarction. Troponin was positive. She developed hypotension after taking 3 sublingual nitroglycerin. A subsequent ECG suggested more ST elevation inferiorly. A code STEMI was called, heparin 4000 units was administered in addition to aspirin and she was transported acutely to the [facility] catheterization laboratory for emergent cardiac catheterization.   PROCEDURE:  The patient was brought to the second floor [facility] Cardiac cath lab with resolution of her prior chest pain. She also had received several doses of intravenous morphine in the emergency room prior to transfer. Her right toe responded anteriorly and a 6 French arterial sheath was healthy. A 6 [place] JL4 diagnostic catheter was used for selective angiography into the left coronary system. A 6 French right guide catheter was used for angiography of the right coronary artery. With the demonstration of significant thrombus burden in the proximal right coronary artery Angiomax bolus plus infusion was administered, the patient received 180 mg of oral Brilinta, and a Asahi medium wire was advanced down the RCA. A Priority One aspiration catheter was inserted and 2 runs of thrombectomy were performed with a moderate clot  removal. A 2.5x20 mm sprinter legend balloon for predilatation following thrombectomy. A Xience Alpine 3.5x23 mm DES stent was deployed x2 at 12 and 13 atmospheres. Post stent dilatation was done with a [place] Emerge balloon at 12 and 14 atmospheres and a 3.77 mm size. Scout angiography confirmed an excellent angiographic result at the stented segment. Initially, there was sluggish flow in a very diminutive marginal branch which arose in the region of the stent. There also was evidence for distal thrombus in the distal aspect of the PDA vessel. For this reason, and Aggrastat was added to the anticoagulation regimen. Several views suggested that this was opening up and as a result no PCI was performed in the PDA to allow for continued  2b3a  therapy for 18 hour duration. A 6 French pigtail catheter was then inserted for RAO ventriculography. The patient had significant urge to urinate but could not void lying flat. A Foley catheter was inserted. She left the catheterization laboratory with stable hemodynamics chest pain free and was transported to the coronary care unit.  HEMODYNAMICS:   Central Aorta: 120/78   Left Ventricle: 120/6  ANGIOGRAPHY:  1. Left main: Angiographically normal and bifurcated into an LAD and left circumflex vessel 2. LAD: Angiographically normal vessel which gave rise to 2 diagonals and several septal perforator arteries and extended to the apex  3. Left circumflex: Angiographically normal vessel which gave rise to one marginal branch.  4. Right coronary artery: Large dominant right coronary artery with extensive thrombus in the proximal third of the vessel with narrowing up to 90% with also evidence for distal thrombus in the distal PDA  Following emergent percutaneous coronary intervention with thrombectomy, PTCA, insertion of a 3.5x23 mm on its Alpine DES  stent postdilated to 3.77 mm, the proximal stenosis was reduced to 0%. There was complete resolution of thrombus in this  region. Initially there was sluggish flow which did improve and a diminutive marginal branch which arose in the region of the stent. There also was PDA thrombus embolization PDA thrombus but was not felt that there was a stenosis at the site and as a result Aggrastat was added to bivalirudin therapy for improved anticoagulation.   Left ventriculography revealed an ejection fraction of 55%. There was mild mid inferior hypocontractility.   IMPRESSION:  Acute inferior ST segment elevation myocardial infarction secondary to proximal RCA extensive thrombus suggestive of an ulcerated plaque in the etiology of his acute coronary syndrome.  Normal LAD and left circumflex vessel  Successful emergent PCI of the proximal RCA with thrombectomy, PTCA, DES stenting with a 3.5x23 mm on its DES stent postdilated to 3.77 mm with evidence for initial sluggish flow and a very small marginal branch arising from the stented segment and probable distal PDA thrombus embolization  Angiomax/180 mg oral Brilinta/IC nitroglycerin/Aggrastat   Troy Sine, MD, [facility] 06/17/2013 12:56 AM

## 2013-06-17 NOTE — Progress Notes (Signed)
PT Cancellation Note  Patient Details Name: Kelly [name] MRN: [id number] DOB: [birth year]   Cancelled Treatment:    Reason Eval/Treat Not Completed: Medical issues which prohibited therapy (elevated troponin with emergent cath)   Lanetta Inch [facility] 06/17/2013, 7:32 AM Elwyn Reach, [place]

## 2013-06-17 NOTE — Progress Notes (Signed)
Inpatient Diabetes Program Recommendations  AACE/ADA: New Consensus Statement on Inpatient Glycemic Control (2013)  Target Ranges:  Prepandial:   less than 140 mg/dL      Peak postprandial:   less than 180 mg/dL (1-2 hours)      Critically ill patients:  140 - 180 mg/dL  Results for ISABELL, BONAFEDE (MRN [id number]) as of 06/17/2013 15:25  Ref. Range 06/16/2013 12:00 06/16/2013 16:43 06/16/2013 21:40 06/17/2013 08:43 06/17/2013 12:04  Glucose-Capillary Latest Range: 70-99 mg/dL 272 (H) 225 (H) 312 (H) 319 (H) 258 (H)   Consider adding basal Lantus or Levemir 20 units and increase Novolog scale to moderate. A1C=8.8, will need follow-up with PCP for management. Thank you  Raoul Pitch BSN, RN,CDE Inpatient Diabetes Coordinator [phone] (team pager)

## 2013-06-17 NOTE — Progress Notes (Signed)
Pt refuses lab draws. MD notified. Orders for PICC line

## 2013-06-17 NOTE — Care Management Note (Addendum)
    Page 1 of 2   06/20/2013     4:14:41 PM   CARE MANAGEMENT NOTE 06/20/2013  Patient:  Kelly [name], Kelly [name]   Account Number:  [id number]  Date Initiated:  06/16/2013  Documentation initiated by:  DAVIS,RHONDA  Subjective/Objective Assessment:   oncology pt with recent chemo and rt now with chest pain, and poss resp infection,     Action/Plan:   home when stable   Anticipated DC Date:  06/19/2013   Anticipated DC Plan:  HOME/SELF CARE  In-house referral  Clinical Social Worker      DC Planning Services  CM consult  Medication Assistance      Choice offered to / List presented to:             Status of service:  Completed, signed off Medicare Important Message given?  NA - LOS <3 / Initial given by admissions (If response is "NO", the following Medicare IM given date fields will be blank) Date Medicare IM given:   Date Additional Medicare IM given:    Discharge Disposition:  HOME/SELF CARE  Per UR Regulation:  Reviewed for med. necessity/level of care/duration of stay  If discussed at [place] of Stay Meetings, dates discussed:    Comments:  06/20/13 [name],RN,BSN [phone] PT [place]; COMPLETED BRILINTA ASSISTANCE PROGRAM WITH PT AND MD; FAXED FORM TO [facility] INSTRUCTED TO MAIL TO DRUG [place].  SHE VERB UNDERSTANDING OF THIS.  06/19/13 [name],RN,BSN [phone] FINANCIAL COUNSELOR CONSULTED TO ASSIST WITH POSS DISABILITY/MEDICAID.Marland KitchenMarland KitchenWILL CONT TO FOLLOW/ASSIST WHERE POSSIBLE.  4/8  1001 debbie dowell rn,bsn pt has 49month old infant and her husband recently passed away per nse. supportive mother but pt under alot of stress. have asked sw to speak w pt.  4/7  1400 debbie dowell rn,bsn gave pt 30day free and copay assist card for brilinta. pt 's ins req pt to pay 100%out of pocket then file for reimbursement. with copay card she should be able to get for around 18.00 per month.  [id number] [name], [place], [place],  [place] [phone] Chart Reviewed for discharge and hospital needs. Discharge needs at time of review: None present will follow for needs. Review of patient progress due on [id number].

## 2013-06-17 NOTE — Progress Notes (Signed)
Called about patient with chest pain, diaphoretic and had received ntg x3 sl. Ekg had been done and stated acute MI and Stemi. 12 lead ekg monitor as well as RRT monitor. Pt received morphine and pain decreased from 10/10 to 7/10.Pt took ASA. Pain returned. Pt. had ekg changes and with ST elevation --inferior leads. Ruthell Rummage NP was Consulting with Dr. Roel Cluck and Dr. Joya Gaskins consulted in e-link. Heart rate dropped suddenly as well as decreased LOC. Code called due to instability. Atropine 1mg  given, SBp dropped 70's. Bolus given. Pt was able to maintain airway and sats. NRB on and weaned eventually to [place] . Heparin started. Heart rhythm changes SR, SB and brief second and third degree heart block. Bp became more stable and patient transferred to cath lab.

## 2013-06-17 NOTE — Progress Notes (Signed)
Event: Notified by RN at 2210 that pt c/o CP 10/10. RN has obtained EKG which has been interpreted as Acute MI/STEMI. NP to bedside. Subjective: Pt reports 10/10 SSCP that radiates to her (L) arm, is not associated w/ position change or deep breathing. She reports feeling SOB and initially denied nausea but after approx 20 minutes did c/o nausea and dizziness.  Objective: Ms. Junio is a 38 y/o female with known h/o diabetes mellitus, morbid obesity and long-standing history of hypertension who was recently diagnosed endometrial carcinoma s/p surgery and had received chemotherapy 4 days ago. She started developing chest pain and shortness of breath w/ palpitations last approx 24 hours PTA on 06/15/2013. In the ED pt was noted to be very tachycardic. CTA of the chest was negative for PE and noted features concerning for possible pneumonia vs inflammatory process vs drug toxicity vs metastatic process. After admission to med/surg floor pt's 3rd troponin was reported elevated at .88 after 2 neg troponin's. Pt was transferred to telemetry unit and RN reports that pt had another episode of of CP at approx 1800 this evening that was relieved w/ her PRN SL NTG. At bedside pt noted somewhat restless in bed. BBS diminished but otherwise CTA. No objective respiratory distress. Pt has received SL NTG x 2 and BP at the time of my assessment was 90/71, HR-98 so a third dose was not given. MS 2 mg was given IV which reduced pain to 7/10. Pt was given 4 81 mg chewable ASA. EKG reveals some concern for ST elevations in leads II, III and aVF though EKG quality less than desirable these are clearly changes from previous EKG. While consulting w/ w/ Dr Roel Cluck in ED regarding EKG changes (as EKG's had not initially crossed over into EPIC), was notified by RR RN that pt had become Bradycardic , EKG repeated and RN requested Dr Joya Gaskins w/ [facility] view EKG. Dr Joya Gaskins confirmed this repeat EKG reveals developing inferior STEMI w/ a  brief episode of 3rd degree HB. Dr Roel Cluck and myself presented to bedside. Dr Roel Cluck spoke w/ Dr Claiborne Billings w/ cardiology (STEMI) by phone. Code STEMI was called and Care-link was notified. Assessment/Plan: 1. Acute inferior STEMI: Per Dr Evette Georges recommendation Heparin bolus 4000u given IV and drip started at 1000/units/hour. Pt's BP much improved 122/67 so will start NTG drip as pt continued to have persistent CP. Carelink reported unable to get a transport team here STAT as [place] of [place]. [facility] notified and arrived approx 15 minutes later to transport pt. RN has reported to staff in cath lab at [facility] who are present and aware of pt's pending tx. At the time of my departure pt was leaving the unit via [facility] in route to cath lab at [facility].  Jeryl Columbia, NP-C [facility] Pager [phone]

## 2013-06-17 NOTE — Progress Notes (Signed)
Chaplain responded to Code STEMI for pt coming from [facility]. Met pt's sister and brother when they arrived and briefed them on cath lab procedures. Stayed with them during the procedure, including leading them to [place]. We had prayer together for pt. Visited control room to check on progress and let pt know family was here. Dr. Georgina Peer reported to family that a stent had been inserted and blood flow restored. I took family to [facility] waiting area to await pt's arrival from cath lab. Pt's family expressed great appreciation for chaplain's presence and assistance.

## 2013-06-17 NOTE — Progress Notes (Signed)
Patient c/o chest pain 8/10 @ 2147, Nitro .4 mg SL given, CP went down to 6/10 then given second nitro and EKG taken w/c showed  Acute MI/STEMI. Notified CN and on call NP about result. Rapid response initiated, third dose of Nitro given-CP down to 3/10 with vital signs 141/72,HR117, O2 sat 100% at 4L/min.At 2218 CP increased to 10/10,diaphoretic,c/o dizziness and nausea, Morphine 2 mg IV given and CP down to 7/10 with vital signs 111/71 PR 113 , O2 sat 100% at 4 L [place].@ 2238, BP dropped to 73/57, HR 40, RR33 O2 sat 99% on NRB, monitor reading 2nd degree HB- called for a code at this time. Notified family member/emergency contact/sister and made them aware about patients situation and the pending transfer of patient to [facility].

## 2013-06-17 NOTE — Progress Notes (Signed)
Patient Name: Kelly [name] Date of Encounter: 06/17/2013     Principal Problem:   Chest pain Active Problems:   SEIZURE DISORDER   HCAP (healthcare-associated pneumonia)   Shortness of breath   Diabetes mellitus type 2, uncontrolled   STEMI (ST elevation myocardial infarction)    SUBJECTIVE  The patient is doing well this morning.  She still has some mild residual chest pressure.  Her EKG this morning shows mild inferior wall ST elevation.  Vital signs are stable.  On tirofiban infusion.  CURRENT MEDS . antiseptic oral rinse  15 mL Mouth Rinse QID  . aspirin EC  81 mg Oral Daily  . ceFEPime (MAXIPIME) IV  2 g Intravenous Q12H  . docusate sodium  100 mg Oral BID  . ferrous sulfate  325 mg Oral TID WC  . glipiZIDE  10 mg Oral QAC breakfast  . insulin aspart  0-9 Units Subcutaneous TID WC  . metoprolol tartrate  25 mg Oral BID  . multivitamin with minerals  1 tablet Oral Daily  . OXcarbazepine  150 mg Oral Daily  . pantoprazole  40 mg Oral Daily  . polyethylene glycol  17 g Oral Daily  . Ticagrelor  90 mg Oral BID  . vancomycin  1,000 mg Intravenous Q12H    OBJECTIVE  Filed Vitals:   06/17/13 0700 06/17/13 0800 06/17/13 0900 06/17/13 1000  BP: 116/71 132/82 123/79 113/46  Pulse: 99 104 102 101  Temp:  97.8 F (36.6 C)    TempSrc:  Oral    Resp:      Height:      Weight:      SpO2: 100% 100% 100% 100%    Intake/Output Summary (Last 24 hours) at 06/17/13 1028 Last data filed at 06/17/13 0900  Gross per 24 hour  Intake 650.47 ml  Output   1850 ml  Net -1199.53 ml   Filed Weights   06/16/13 0400 06/16/13 1410 06/17/13 0130  Weight: 251 lb 8.7 oz (114.1 kg) 242 lb 12.8 oz (110.133 kg) 246 lb 0.5 oz (111.6 kg)    PHYSICAL EXAM  General: Pleasant, NAD. Neuro: Alert and oriented X 3. Moves all extremities spontaneously. Psych: Normal affect. HEENT:  Normal  Neck: Supple without bruits or JVD. Lungs:  Resp regular and unlabored, CTA. Heart: RRR  no s3, s4, or murmurs. Abdomen: Soft, non-tender, non-distended, BS + x 4.  Right groin stable. Extremities: No clubbing, cyanosis or edema. DP/PT/Radials 2+ and equal bilaterally.  Accessory Clinical Findings  CBC  Recent Labs  06/15/13 2238  06/16/13 0330 06/17/13 0418  WBC 6.7  --  6.3 9.9  NEUTROABS 4.2  --  4.6  --   HGB 10.3*  < > 9.3* 9.4*  HCT 31.5*  < > 29.3* 28.3*  MCV 59.2*  --  60.7* 60.0*  PLT 373  --  262 314  < > = values in this interval not displayed. Basic Metabolic Panel  Recent Labs  06/16/13 0330 06/17/13 0418  NA 131* 136*  K 4.3 4.3  CL 96 97  CO2 24 23  GLUCOSE 341* 293*  BUN 8 6  CREATININE 0.42* 0.35*  CALCIUM 8.7 8.5   Liver Function Tests  Recent Labs  06/15/13 2238 06/16/13 0330  AST 7 7  ALT 12 10  ALKPHOS 141* 126*  BILITOT 0.7 0.8  PROT 8.3 7.2  ALBUMIN 3.7 3.4*   No results found for this basename: LIPASE, AMYLASE,  in the last 72 hours Cardiac  Enzymes  Recent Labs  06/16/13 0810 06/16/13 1501 06/16/13 2155  TROPONINI <0.30 0.88* 4.04*   BNP No components found with this basename: POCBNP,  D-Dimer No results found for this basename: DDIMER,  in the last 72 hours Hemoglobin A1C  Recent Labs  06/16/13 0330  HGBA1C 8.8*   Fasting Lipid Panel  Recent Labs  06/16/13 0330  CHOL 132  HDL 44  LDLCALC 72  TRIG 82  CHOLHDL 3.0   Thyroid Function Tests  Recent Labs  06/16/13 0330  TSH 2.110  T3FREE 3.3    TELE  Normal sinus rhythm  ECG  Normal sinus rhythm with evolving inferolateral myocardial infarction  Radiology/Studies  Ct Head Wo Contrast  06/16/2013   CLINICAL DATA:  Tingling and numbness left upper extremity in both lower extremities. History of seizures.  EXAM: CT HEAD WITHOUT CONTRAST  TECHNIQUE: Contiguous axial images were obtained from the base of the skull through the vertex without intravenous contrast.  COMPARISON:  09/14/2008  FINDINGS: Ventricles, cisterns and other CSF spaces are  within normal. There is no mass, mass effect, shift of midline structures or acute hemorrhage. No evidence of acute infarction. Remaining bones and soft tissues are within normal.  IMPRESSION: No acute intracranial findings.   Electronically Signed   By: Marin Olp M.D.   On: 06/16/2013 01:34   Ct Angio Chest Pe W/cm &/or Wo Cm  06/15/2013   CLINICAL DATA:  Chest pain and shortness of breath. History of stage III endometrial cancer. Currently on chemotherapy.  EXAM: CT ANGIOGRAPHY CHEST WITH CONTRAST  TECHNIQUE: Multidetector CT imaging of the chest was performed using the standard protocol during bolus administration of intravenous contrast. Multiplanar CT image reconstructions and MIPs were obtained to evaluate the vascular anatomy.  CONTRAST:  175mL OMNIPAQUE IOHEXOL 350 MG/ML SOLN  COMPARISON:  CT abdomen/pelvis 03/27/2013  FINDINGS: Lungs are adequately inflated with minimal nodular opacification over the medial left lower lobe. Heart size of normal. There is no evidence of pulmonary embolism. Remaining mediastinal structures are within normal. There is no evidence of hilar, mediastinal or axillary adenopathy.  Images through the upper abdomen are notable only for mild diverticulosis of the colon. Remaining bones a soft tissues are within normal.  Review of the MIP images confirms the above findings.  IMPRESSION: No evidence of pulmonary embolism.  Minimal nonspecific nodular opacification over the medial left lower lobe which may be due to infection, inflammatory process/ drug toxicity or metastatic disease. Consider followup CT in 4-6 weeks.   Electronically Signed   By: Marin Olp M.D.   On: 06/15/2013 23:46    ASSESSMENT AND PLAN 1. acute inferior wall myocardial infarction treated with drug-eluting stent. 2. endometrial carcinoma, on chemotherapy 3.  Diabetes mellitus 4. hypertensive cardiovascular disease 5.  Seizure disorder 6. chronic anemia 7. abnormal chest x-ray, currently on  vancomycin and cefepime.   Plan: Continue current cardiac medications--aspirin, Brilinta, metoprolol. Add lipitor and lisinopril. Will ask internal medicine to consult for medical issues.  Signed, Darlin Coco MD

## 2013-06-17 NOTE — Progress Notes (Signed)
RFA sheath removed at 1050 by Vincente Poli. Relieved at 25 min by canderson rn. total hold time 90 min. Site slightly "puffy" but soft to palpation. Dressing applied . Patient teaching done. RT PT and DP easily palpable.Bed Rest begins at 1230 pm.

## 2013-06-18 ENCOUNTER — Ambulatory Visit: Payer: No Typology Code available for payment source | Admitting: Oncology

## 2013-06-18 ENCOUNTER — Inpatient Hospital Stay (HOSPITAL_COMMUNITY): Payer: No Typology Code available for payment source

## 2013-06-18 ENCOUNTER — Other Ambulatory Visit: Payer: No Typology Code available for payment source

## 2013-06-18 DIAGNOSIS — R87610 Atypical squamous cells of undetermined significance on cytologic smear of cervix (ASC-US): Secondary | ICD-10-CM

## 2013-06-18 DIAGNOSIS — D509 Iron deficiency anemia, unspecified: Secondary | ICD-10-CM

## 2013-06-18 DIAGNOSIS — F3289 Other specified depressive episodes: Secondary | ICD-10-CM

## 2013-06-18 DIAGNOSIS — F329 Major depressive disorder, single episode, unspecified: Secondary | ICD-10-CM

## 2013-06-18 LAB — CBC WITH DIFFERENTIAL/PLATELET
BASOS ABS: 0 10*3/uL (ref 0.0–0.1)
Basophils Relative: 0 % (ref 0–1)
EOS ABS: 0.1 10*3/uL (ref 0.0–0.7)
Eosinophils Relative: 1 % (ref 0–5)
HCT: 25.7 % — ABNORMAL LOW (ref 36.0–46.0)
HEMOGLOBIN: 8.5 g/dL — AB (ref 12.0–15.0)
LYMPHS PCT: 23 % (ref 12–46)
Lymphs Abs: 2.5 10*3/uL (ref 0.7–4.0)
MCH: 20 pg — ABNORMAL LOW (ref 26.0–34.0)
MCHC: 33.1 g/dL (ref 30.0–36.0)
MCV: 60.5 fL — ABNORMAL LOW (ref 78.0–100.0)
Monocytes Absolute: 0.4 10*3/uL (ref 0.1–1.0)
Monocytes Relative: 4 % (ref 3–12)
Neutro Abs: 8 10*3/uL — ABNORMAL HIGH (ref 1.7–7.7)
Neutrophils Relative %: 72 % (ref 43–77)
Platelets: 283 10*3/uL (ref 150–400)
RBC: 4.25 MIL/uL (ref 3.87–5.11)
RDW: 18.4 % — AB (ref 11.5–15.5)
WBC: 11 10*3/uL — AB (ref 4.0–10.5)

## 2013-06-18 LAB — PROCALCITONIN: PROCALCITONIN: 0.1 ng/mL

## 2013-06-18 LAB — GLUCOSE, CAPILLARY
GLUCOSE-CAPILLARY: 243 mg/dL — AB (ref 70–99)
GLUCOSE-CAPILLARY: 235 mg/dL — AB (ref 70–99)
Glucose-Capillary: 337 mg/dL — ABNORMAL HIGH (ref 70–99)
Glucose-Capillary: 204 mg/dL — ABNORMAL HIGH (ref 70–99)

## 2013-06-18 LAB — VANCOMYCIN, TROUGH: Vancomycin Tr: <5 ug/mL — ABNORMAL LOW (ref 10.0–20.0)

## 2013-06-18 MED ORDER — INSULIN ASPART 100 UNIT/ML ~~LOC~~ SOLN
0.0000 [IU] | Freq: Three times a day (TID) | SUBCUTANEOUS | Status: DC
  Administered 2013-06-18: 5 [IU] via SUBCUTANEOUS
  Administered 2013-06-19: 8 [IU] via SUBCUTANEOUS
  Administered 2013-06-19: 11 [IU] via SUBCUTANEOUS

## 2013-06-18 MED ORDER — SODIUM CHLORIDE 0.9 % IJ SOLN
10.0000 mL | INTRAMUSCULAR | Status: DC | PRN
  Administered 2013-06-18 – 2013-06-20 (×5): 10 mL

## 2013-06-18 MED ORDER — SENNOSIDES-DOCUSATE SODIUM 8.6-50 MG PO TABS
2.0000 | ORAL_TABLET | Freq: Two times a day (BID) | ORAL | Status: DC
  Administered 2013-06-18 – 2013-06-19 (×2): 2 via ORAL
  Administered 2013-06-19: 1 via ORAL
  Administered 2013-06-19 – 2013-06-20 (×2): 2 via ORAL
  Filled 2013-06-18 (×7): qty 2

## 2013-06-18 MED ORDER — VANCOMYCIN HCL IN DEXTROSE 1-5 GM/200ML-% IV SOLN
1000.0000 mg | Freq: Three times a day (TID) | INTRAVENOUS | Status: DC
  Filled 2013-06-18 (×2): qty 200

## 2013-06-18 MED ORDER — INSULIN GLARGINE 100 UNIT/ML ~~LOC~~ SOLN
18.0000 [IU] | Freq: Every day | SUBCUTANEOUS | Status: DC
  Administered 2013-06-19: 18 [IU] via SUBCUTANEOUS
  Filled 2013-06-18 (×2): qty 0.18

## 2013-06-18 MED ORDER — FLUCONAZOLE 150 MG PO TABS
150.0000 mg | ORAL_TABLET | Freq: Once | ORAL | Status: AC
  Administered 2013-06-18: 150 mg via ORAL
  Filled 2013-06-18: qty 1

## 2013-06-18 MED ORDER — SODIUM CHLORIDE 0.9 % IV SOLN
500.0000 mg | Freq: Once | INTRAVENOUS | Status: AC
  Administered 2013-06-18: 500 mg via INTRAVENOUS
  Filled 2013-06-18: qty 500

## 2013-06-18 MED ORDER — METOPROLOL TARTRATE 25 MG PO TABS
25.0000 mg | ORAL_TABLET | Freq: Two times a day (BID) | ORAL | Status: DC
  Administered 2013-06-19 – 2013-06-20 (×4): 25 mg via ORAL
  Filled 2013-06-18 (×5): qty 1

## 2013-06-18 MED ORDER — SODIUM CHLORIDE 0.9 % IJ SOLN: 10.0000 mL | Freq: Two times a day (BID) | INTRAMUSCULAR | Status: DC

## 2013-06-18 MED ORDER — FERROUS GLUCONATE 324 (38 FE) MG PO TABS
324.0000 mg | ORAL_TABLET | Freq: Two times a day (BID) | ORAL | Status: DC
  Administered 2013-06-18 – 2013-06-20 (×5): 324 mg via ORAL
  Filled 2013-06-18 (×7): qty 1

## 2013-06-18 MED FILL — Sodium Chloride IV Soln 0.9%: INTRAVENOUS | Qty: 50 | Status: AC

## 2013-06-18 NOTE — Progress Notes (Signed)
Patient Name: Kelly [name] Date of Encounter: 06/18/2013     Principal Problem:   Chest pain Active Problems:   SEIZURE DISORDER   HCAP (healthcare-associated pneumonia)   Shortness of breath   Diabetes mellitus type 2, uncontrolled   STEMI (ST elevation myocardial infarction)    SUBJECTIVE   The patient is doing well from a cardiac standpoint.  No further chest tightness.  EKG shows normal sinus rhythm.  Blood pressure remains soft.  She has dyspnea with activity which shows multifactorial including anemia. Dr. [name] saw her this morning from the oncology standpoint.  Appreciate her note.   CURRENT MEDS . antiseptic oral rinse  15 mL Mouth Rinse QID  . aspirin EC  81 mg Oral Daily  . atorvastatin  80 mg Oral q1800  . ceFEPime (MAXIPIME) IV  2 g Intravenous Q12H  . docusate sodium  100 mg Oral BID  . ferrous gluconate  324 mg Oral BID  . glipiZIDE  10 mg Oral QAC breakfast  . insulin aspart  0-9 Units Subcutaneous TID WC  . lisinopril  5 mg Oral Daily  . metoprolol tartrate  25 mg Oral BID  . multivitamin with minerals  1 tablet Oral Daily  . OXcarbazepine  150 mg Oral Daily  . pantoprazole  40 mg Oral Daily  . polyethylene glycol  17 g Oral Daily  . senna-docusate  2 tablet Oral BID  . Ticagrelor  90 mg Oral BID  . vancomycin  500 mg Intravenous Once  . vancomycin  1,000 mg Intravenous Q12H  . vancomycin  1,000 mg Intravenous Q8H    OBJECTIVE  Filed Vitals:   06/18/13 0306 06/18/13 0412 06/18/13 0600 06/18/13 0800  BP: 99/65 101/63 97/61 99/62   Pulse:      Temp:  98.6 F (37 C)  98.5 F (36.9 C)  TempSrc:  Oral  Oral  Resp:    16  Height:      Weight:  246 lb 4.1 oz (111.7 kg)    SpO2:  98%  98%    Intake/Output Summary (Last 24 hours) at 06/18/13 0957 Last data filed at 06/18/13 0800  Gross per 24 hour  Intake 1613.2 ml  Output   6100 ml  Net -4486.8 ml   Filed Weights   06/16/13 1410 06/17/13 0130 06/18/13 0412  Weight: 242 lb 12.8  oz (110.133 kg) 246 lb 0.5 oz (111.6 kg) 246 lb 4.1 oz (111.7 kg)    PHYSICAL EXAM  General: Pleasant, NAD.  Neuro: Alert and oriented X 3. Moves all extremities spontaneously.  Psych: Normal affect.  HEENT: Normal  Neck: Supple without bruits or JVD.  Lungs: Resp regular and unlabored, CTA.  Heart: RRR no s3, s4, or murmurs.  Abdomen: Soft, non-tender, non-distended, BS + x 4. Right groin stable. Extremities: No clubbing, cyanosis or edema. DP/PT/Radials 2+ and equal bilaterally.  Accessory Clinical Findings  CBC  Recent Labs  06/16/13 0330 06/17/13 0418 06/18/13 0326  WBC 6.3 9.9 11.0*  NEUTROABS 4.6  --  8.0*  HGB 9.3* 9.4* 8.5*  HCT 29.3* 28.3* 25.7*  MCV 60.7* 60.0* 60.5*  PLT 262 314 948   Basic Metabolic Panel  Recent Labs  06/16/13 0330 06/17/13 0418  NA 131* 136*  K 4.3 4.3  CL 96 97  CO2 24 23  GLUCOSE 341* 293*  BUN 8 6  CREATININE 0.42* 0.35*  CALCIUM 8.7 8.5   Liver Function Tests  Recent Labs  06/15/13 2238 06/16/13 0330  AST  7 7  ALT 12 10  ALKPHOS 141* 126*  BILITOT 0.7 0.8  PROT 8.3 7.2  ALBUMIN 3.7 3.4*   No results found for this basename: LIPASE, AMYLASE,  in the last 72 hours Cardiac Enzymes  Recent Labs  06/16/13 1501 06/16/13 2155 06/17/13 2004  TROPONINI 0.88* 4.04* 15.99*   BNP No components found with this basename: POCBNP,  D-Dimer No results found for this basename: DDIMER,  in the last 72 hours Hemoglobin A1C  Recent Labs  06/16/13 0330  HGBA1C 8.8*   Fasting Lipid Panel  Recent Labs  06/16/13 0330  CHOL 132  HDL 44  LDLCALC 72  TRIG 82  CHOLHDL 3.0   Thyroid Function Tests  Recent Labs  06/16/13 0330  TSH 2.110  T3FREE 3.3    TELE  Sinus tachycardia at 107 per minute  ECG    Radiology/Studies  Ct Head Wo Contrast  06/16/2013   CLINICAL DATA:  Tingling and numbness left upper extremity in both lower extremities. History of seizures.  EXAM: CT HEAD WITHOUT CONTRAST  TECHNIQUE:  Contiguous axial images were obtained from the base of the skull through the vertex without intravenous contrast.  COMPARISON:  09/14/2008  FINDINGS: Ventricles, cisterns and other CSF spaces are within normal. There is no mass, mass effect, shift of midline structures or acute hemorrhage. No evidence of acute infarction. Remaining bones and soft tissues are within normal.  IMPRESSION: No acute intracranial findings.   Electronically Signed   By: Marin Olp M.D.   On: 06/16/2013 01:34   Ct Angio Chest Pe W/cm &/or Wo Cm  06/15/2013   CLINICAL DATA:  Chest pain and shortness of breath. History of stage III endometrial cancer. Currently on chemotherapy.  EXAM: CT ANGIOGRAPHY CHEST WITH CONTRAST  TECHNIQUE: Multidetector CT imaging of the chest was performed using the standard protocol during bolus administration of intravenous contrast. Multiplanar CT image reconstructions and MIPs were obtained to evaluate the vascular anatomy.  CONTRAST:  144mL OMNIPAQUE IOHEXOL 350 MG/ML SOLN  COMPARISON:  CT abdomen/pelvis 03/27/2013  FINDINGS: Lungs are adequately inflated with minimal nodular opacification over the medial left lower lobe. Heart size of normal. There is no evidence of pulmonary embolism. Remaining mediastinal structures are within normal. There is no evidence of hilar, mediastinal or axillary adenopathy.  Images through the upper abdomen are notable only for mild diverticulosis of the colon. Remaining bones a soft tissues are within normal.  Review of the MIP images confirms the above findings.  IMPRESSION: No evidence of pulmonary embolism.  Minimal nonspecific nodular opacification over the medial left lower lobe which may be due to infection, inflammatory process/ drug toxicity or metastatic disease. Consider followup CT in 4-6 weeks.   Electronically Signed   By: Marin Olp M.D.   On: 06/15/2013 23:46    ASSESSMENT AND PLAN 1. acute inferior wall myocardial infarction treated with drug-eluting  stent.  On aspirin Brilinta metoprolol Lipitor and lisinopril 2. endometrial carcinoma, on chemotherapy  3. Diabetes mellitus  4. hypertensive cardiovascular disease  5. Seizure disorder  6. chronic anemia  7. abnormal chest x-ray, currently on vancomycin and cefepime.   Plan: Continue current cardiac medications--aspirin, Brilinta, metoprolol.  And lipitor. Will stop lisinopril at this point because of soft blood pressure. Appreciate oncology consult today  Will ask internal medicine to consult for help with diabetes and  whether her to continue her on antibiotics or not. She will get a Port-A-Cath today rather than a PICC line. Consider transfer  back to [facility] by the end of the week to continue medical and oncology care there.    Signed, Darlin Coco MD

## 2013-06-18 NOTE — Progress Notes (Signed)
Inpatient Diabetes Program Recommendations  AACE/ADA: New Consensus Statement on Inpatient Glycemic Control (2013)  Target Ranges:  Prepandial:   less than 140 mg/dL      Peak postprandial:   less than 180 mg/dL (1-2 hours)      Critically ill patients:  140 - 180 mg/dL   Reason for Visit: Results for AILY, TZENG (MRN [id number]) as of 06/18/2013 14:21  Ref. Range 06/17/2013 12:04 06/17/2013 16:35 06/17/2013 21:55 06/18/2013 08:03 06/18/2013 12:58  Glucose-Capillary Latest Range: 70-99 mg/dL 258 (H) 294 (H) 382 (H) 337 (H) 243 (H)  Results for VAIDA, KERCHNER (MRN [id number]) as of 06/18/2013 14:21  Ref. Range 06/16/2013 03:30  Hemoglobin A1C Latest Range: <5.7 % 8.8 (H)   Diabetes history: Type 2 diabetes Outpatient Diabetes medications: Metformin 1000 mg daily, Glucotrol 10 mg daily Current orders for Inpatient glycemic control: Novolog sensitive tid wit meals.    Please consider adding Lantus 20 units daily and increase Novolog correction to moderate tid with meals.   Thanks, Adah Perl, RN, BC-ADM Inpatient Diabetes Coordinator Pager [phone]

## 2013-06-18 NOTE — Progress Notes (Signed)
CARDIAC REHAB PHASE I   PRE:  Rate/Rhythm: 102 ST  BP:  Supine:   Sitting: 91/63  Standing: 69/47   SaO2: 100%RA  MODE:  Ambulation:   BSC ft  1340-1408 Came to try to walk with pt. C/o dizziness sitting on side of bed. Had pt stand for BP and 69/47.  She said things were spinning. Assisted back to bed. Got pt settled and she then had to use BSC suddenly. Had BM. Helped her get cleaned up. Told pt as long as BP low and she is dizzy that she needs to use BSC. Encouraged her to sit in recliner when BP better. Pt states only shuffling around at home prior to admission. Exhausted with little activity.      Graylon Good, RN BSN  06/18/2013 2:02 PM

## 2013-06-18 NOTE — Progress Notes (Signed)
Peripherally Inserted Central Catheter/Midline Placement  The IV Nurse has discussed with the patient and/or persons authorized to consent for the patient, the purpose of this procedure and the potential benefits and risks involved with this procedure.  The benefits include less needle sticks, lab draws from the catheter and patient may be discharged home with the catheter.  Risks include, but not limited to, infection, bleeding, blood clot (thrombus formation), and puncture of an artery; nerve damage and irregular heat beat.  Alternatives to this procedure were also discussed.  PICC/Midline Placement Documentation        Kelly [name] 06/18/2013, 12:35 PM

## 2013-06-18 NOTE — Progress Notes (Signed)
06/18/2013, 7:57 AM  Hospital day 4 Antibiotics: still on vanc and cefepime, begun for ? pneumonia by imaging on admission, no fever or other symptoms. Chemotherapy: day 8 cycle 1 carboplatin taxol (no gCSF) for recently diagnosed IIIA endometrial carcinoma  Patient seen in cardiac ICU, mother sleeping in room. Discussed with RN at bedside now.  Would agree with hospitalist medicine consult to help manage diabetes etc.  Subjective: "Feeling better today". No chest pain, no left arm pain, not SOB. Groin site sore, can get to [facility]. Uncomfortable as still no BM, have added sennaS 2 bid to present miralax and would suggest glycerin suppository if ok with cardiology. Peripheral IV access very poor -- she will need PAC at some point for further chemo, but  cardiology may prefer just PICC for now, so whichever is best acutely is fine with me. Mouth better with biotene.  Objective: Vital signs in last 24 hours: Blood pressure 97/61, pulse 110, temperature 98.6 F (37 C), temperature source Oral, resp. rate 32, height 5\' 6"  (1.676 m), weight 246 lb 4.1 oz (111.7 kg), last menstrual period 02/24/2013, SpO2 98.00%.   Intake/Output from previous day: 04/07 0701 - 04/08 0700 In: 2332.2 [P.O.:360; I.V.:1222.2; IV Piggyback:750] Out: 6100 [Urine:6100] Intake/Output this shift:    Physical exam: awake, alert, smiling, looks much more comfortable this AM. Respirations not labored supine on RA. Peripheral IVs in bilateral UE. Oral mucosa moist. PERRL, not icteric. Heart RRR no gallop. Lungs clear anteriorly. Abdomen soft, quiet, not tender. Dressing dry right groin. Feet warm with compression stockings on. Moves all extremities in bed.  Lab Results:  Recent Labs  06/17/13 0418 06/18/13 0326  WBC 9.9 11.0*  HGB 9.4* 8.5*  HCT 28.3* 25.7*  PLT 314 283   BMET  Recent Labs  06/16/13 0330 06/17/13 0418  NA 131* 136*  K 4.3 4.3  CL 96 97  CO2 24 23  GLUCOSE 341* 293*  BUN 8 6  CREATININE  0.42* 0.35*  CALCIUM 8.7 8.5   Procalcitonin 0.10    Blood cultures x2 from admission no growth. CBGs 397-673 yesterday    Studies/Results: No results found.   Assessment/Plan: 1. Acute inferior STEMI 06-17-13: cath with RCA thrombectomy and stent. Cardiology managing on heart unit [facility]. May move to floor today. 2. Endometrial carcinoma: presentation and diagnosis Jan 2015, post robotic assisted laparoscopic TAH BSO and bilateral pelvic and periaortic node evaluation by Dr Nancy Marus at [facility] 05-16-2013 IIIA grade 2. First adjuvant taxol carboplatin at [facility] 06-11-13. Blood counts still good, not yet at nadir. Has not had gCSF. Will follow counts daily now, and add gCSF if WBC appears to be dropping significantly. Taxol aches in legs resolved, no nausea now.  2.Diabetes: poorly controlled PTA. Needs home glucometer. Agree with hospitalist service assisting please. 3.No clinical pneumonia: cefepime and vanc begun on admission to [facility] 06-15-13 due to SOB and radiographic findings of minimal changes LLL on CT. Never febrile, no cough. I would be comfortable [place] these if ok with cardiology and medicine consult. 4.Constipation: with recent gyn surgery, chemo, pain meds. I have added bid sennaS to miralax; suggest glycerin suppository if ok with cardiology. 5.Seizure disorder since age 51: may have had post op seizure at [facility], seen then by [facility] neurology and begun on trileptal  6. Iron deficiency anemia related to heavy gyn blood loss, oral iron begun recently. I have changed this to ferrous gluconate for less GI side effects. Hemoglobin lower today, post chemo  and cath etc. Not obviously symptomatic.Daily counts. 7.Obesity: intentional weight loss of 100 lbs since 2012, mostly with exercise.  8. Peripheral IV access difficult with first chemo. She will need PAC at some point, so that would be fine if she is ok for that placement now by cardiology, otherwise PICC for now ok. 9.Social  concerns: recently widowed, I believe shortly after adopting now 33 month old daughter. Mother and sister assisting with the infant. 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 RN to let hospital case manager know. 10.GERD recently begun on protonix outpatient. 11.Sickle trait: found on recent hemoglobin electrophoresis.   Will follow. Thank you!  Gordy Levan MD [phone] Pager [phone]

## 2013-06-18 NOTE — Consult Note (Signed)
[facility] Medical Consultation  Jalisa Sacco OVF:[id number] DOB: 1976-01-22 DOA: 06/15/2013 PCP: [name], PA-C   Requesting physician: Dr. Mare Ferrari Date of consultation: 06/18/2013 Reason for consultation: Medical management of DM and possible pneumonia.  Impression/Recommendations Principal Problem:   Chest pain Active Problems:   SEIZURE DISORDER   HCAP (healthcare-associated pneumonia)   Shortness of breath   Diabetes mellitus type 2, uncontrolled   STEMI (ST elevation myocardial infarction)    Uncontrolled DM -Patient was diagnosed with DM 7 years ago and has been on oral medications.   -Hgb A1C is 8.8, which correlate with a mean plasma glucose of 206. -Appreciate DM coordinator recs.  Will start Lantus 18 units and Novolog moderate SSI.  Possible Pneumonia  -Nodular opacity in LLL -Patient has no cough, or fever.  Her WBC is likely elevated at this point due to her recent cath/stent. -Doubt pneumonia.  Will discontinue IV antibiotics at this point and monitor.  Abdominal tenderness -Recent surgery (3/6) - Surgical incisions do not appear infected. -Recent constipation -U/A appears free of infection. -Will check Guaiac. -Check 2V abdominal xray (r/o free air) - doubt as she has minimally elevated WBC  Acute Inferior STEMI -Management per cardiology  Hypertension / Hypotension -Episode of hypotension with SBP in 60s this afternoon 4/8. -Dr. Mare Ferrari aware. -Lisinopril discontinued. Metoprolol continued with hold parameters.  Endometrial Carcinoma s/p surgery.  Now undergoing chemotherapy -Management per Oncology.  Hx of Seizure Disorder -Stable on Trileptal  -I will followup again tomorrow. Please contact me if I can be of assistance in the meanwhile. Thank you for this consultation.  Chief Complaint:   HPI:  38 yo female with recent diagnosis of endometrial cancer who under went total hysterectomy on 3/6 at [facility] by Dr.  [name].  She is receiving chemotherapy at [facility].  She has a history of DM X 7 years, and Seizure disorder.  Unfortunately, her husband passed just 3 months ago and she has an infant at home.    She was admitted to [facility] hospital by the Hospitalists with presumed pneumonia, as she has a nodular opacity over the left lower lobe.  She developed STEMI and was transferred to [facility] on the Cardiology service, and was treated with thrombectomy and  a drug eluting stent to her RCA.    [facility] is consulted to manage her DM and possible pneumonia.    Review of Systems:  Patient complains of constipation x 5 days that ended this am with a black stool.   Able to eat and drink without difficulty. All other systems reviewed and found to be negative.  Past Medical History  Diagnosis Date  . Diabetes mellitus without complication   . Obesity   . Seizure disorder     tonic clonic  . Pelvic pain   . Anxiety     no meds  . GERD (gastroesophageal reflux disease)     occasional no meds   . Seizures     trigger by strong odors, last seizure 1 yr ago, no meds   Past Surgical History  Procedure Laterality Date  . Abdominal hysterectomy     Social History:  reports that she has never smoked. She has never used smokeless tobacco. She reports that she does not drink alcohol or use illicit drugs.  Allergies  Allergen Reactions  . Amoxicillin Nausea And Vomiting, Swelling and Rash    Rash, vomiting, throat closing Rash, vomiting, throat closing  . Flagyl [Metronidazole] Nausea And Vomiting  04/30/13-Per pt she is unable to tolerate this medication, causes rash 04/30/13-Per pt she is unable to tolerate this medication, causes rash  . Adhesive [Tape] Rash and Dermatitis    "takes skin off" "takes skin off"   Family History  Problem Relation Age of Onset  . Depression Mother   . Hypertension Father   . Hypertension Sister   . Cancer Paternal Aunt     breast  . Heart disease Neg Hx    . Diabetes Sister     type 1 diabetes    Prior to Admission medications   Medication Sig Start Date End Date Taking? Authorizing Provider  dexamethasone (DECADRON) 4 MG tablet Take 20 mg by mouth as directed. Take 5 tab with food 12 hr and 6 hr prior to Chemotherapy.Taxol 06/04/13  Yes Lennis Marion Downer, MD  docusate sodium (STOOL SOFTENER) 100 MG capsule Take 100-200 mg by mouth 2 (two) times daily.  05/18/13  Yes Historical Provider, MD  ferrous sulfate 325 (65 FE) MG tablet Take 325 mg by mouth.   Yes Historical Provider, MD  glipiZIDE (GLUCOTROL) 10 MG tablet Take 10 mg by mouth.   Yes Historical Provider, MD  LORazepam (ATIVAN) 1 MG tablet Place 1/2- 1 tab under tongue or swallow every 6 hr as needed for nausea.  Take 1 tab the night of chemo.  Will make drowsy 06/04/13  Yes Lennis Marion Downer, MD  meloxicam (MOBIC) 7.5 MG tablet Take 7.5 mg by mouth daily.   Yes Historical Provider, MD  metFORMIN (GLUCOPHAGE) 1000 MG tablet Take 1,000 mg by mouth.   Yes Historical Provider, MD  Multiple Vitamins-Minerals (MULTIVITAMIN WITH MINERALS) tablet Take 1 tablet by mouth daily.   Yes Historical Provider, MD  naproxen sodium (ALEVE) 220 MG tablet Take 440 mg by mouth 2 (two) times daily as needed (for pain).    Yes Historical Provider, MD  ondansetron (ZOFRAN) 8 MG tablet Take 1-2 tablets (8-16 mg total) by mouth every 12 (twelve) hours as needed for nausea or vomiting. Take 1 the am after chemo.  Will not make drowsy. 06/04/13  Yes Lennis Marion Downer, MD  OXcarbazepine (TRILEPTAL) 150 MG tablet Take 150 mg by mouth daily.  05/18/13 05/18/14 Yes Historical Provider, MD  oxyCODONE-acetaminophen (PERCOCET/ROXICET) 5-325 MG per tablet Take 1 tablet by mouth every 6 (six) hours as needed.  05/18/13  Yes Historical Provider, MD  pantoprazole (PROTONIX) 40 MG tablet Take 1 tablet (40 mg total) by mouth daily. 06/04/13  Yes Lennis Marion Downer, MD  polyethylene glycol (MIRALAX / GLYCOLAX) packet Take 17 g by mouth daily.    Yes Historical Provider, MD   Physical Exam: Blood pressure 90/63, pulse 110, temperature 98.5 F (36.9 C), temperature source Oral, resp. rate 16, height 5\' 6"  (1.676 m), weight 111.7 kg (246 lb 4.1 oz), last menstrual period 02/24/2013, SpO2 96.00%. Filed Vitals:   06/18/13 1515  BP: 90/63  Pulse:   Temp:   Resp:      General:  Obese, pleasant female, awake, alert.  Lying flat in bed.  Eyes: anicteric, PERRl  ENT: MMM, no erythema or exudate.  Missing several teeth  Neck: supple  Cardiovascular: rrr, no obvious m/r/g  Respiratory: cta, no w/c/r  Abdomen: obese, soft, tender to even light palpation in hypo gastric and Left lower quadrants. Surgical incisions appear to be healing well  Skin: no skin breakage on feet, no obvious rash  Musculoskeletal: 5/5 strength in each.  Psychiatric: A&O, cooperative, but appears depressed.  Neurologic: non focal   Basic Metabolic Panel:  Recent Labs Lab 06/15/13 2238 06/15/13 2247 06/16/13 0330 06/17/13 0418  NA 130* 134* 131* 136*  K 4.0 4.0 4.3 4.3  CL 91* 97 96 97  CO2 21  --  24 23  GLUCOSE 439* 442* 341* 293*  BUN 9 7 8 6   CREATININE 0.45* 0.50 0.42* 0.35*  CALCIUM 10.2  --  8.7 8.5   Liver Function Tests:  Recent Labs Lab 06/15/13 2238 06/16/13 0330  AST 7 7  ALT 12 10  ALKPHOS 141* 126*  BILITOT 0.7 0.8  PROT 8.3 7.2  ALBUMIN 3.7 3.4*   CBC:  Recent Labs Lab 06/15/13 2238 06/15/13 2247 06/16/13 0330 06/17/13 0418 06/18/13 0326  WBC 6.7  --  6.3 9.9 11.0*  NEUTROABS 4.2  --  4.6  --  8.0*  HGB 10.3* 12.6 9.3* 9.4* 8.5*  HCT 31.5* 37.0 29.3* 28.3* 25.7*  MCV 59.2*  --  60.7* 60.0* 60.5*  PLT 373  --  262 314 283   Cardiac Enzymes:  Recent Labs Lab 06/16/13 0330 06/16/13 0810 06/16/13 1501 06/16/13 2155 06/17/13 2004  TROPONINI <0.30 <0.30 0.88* 4.04* 15.99*   CBG:  Recent Labs Lab 06/17/13 1204 06/17/13 1635 06/17/13 2155 06/18/13 0803 06/18/13 1258  GLUCAP 258* 294* 382*  337* 243*    Radiological Exams on Admission: Dg Chest Port 1 View  06/18/2013   CLINICAL DATA:  Status post PICC line placement  EXAM: PORTABLE CHEST - 1 VIEW  COMPARISON:  None.  FINDINGS: The patient has undergone right-sided PICC line placement. The tip of the catheter lies in the region of the junction of the SVC with the right atrium. The lungs are hypoinflated. The cardiac silhouette is mildly enlarged. The perihilar interstitial markings are prominent.  IMPRESSION: There is no evidence of immediate postprocedure complication following placement of a PICC line via the right upper extremity.  These results were called by telephone at the time of interpretation on 06/18/2013 at 1:06 PM to Terese Door, RN, who verbally acknowledged these results.   Electronically Signed   By: David  Martinique   On: 06/18/2013 13:07    Time spent: 60 min.  Melton Alar, PA-C [facility] Pager [phone]  If 7PM-7AM, please contact night-coverage [email or web address] Password [facility] 06/18/2013, 4:09 PM   Addendum  Patient seen and examined, chart and data base reviewed.  I agree with the above assessment and plan.  For full details please see Mrs. Imogene Burn PA note.   The above note is reviewed an amended as necessary.   Birdie Hopes, MD [facility] Pager: [phone] 06/18/2013, 5:43 PM

## 2013-06-18 NOTE — Progress Notes (Signed)
PT Cancellation Note  Patient Details Name: Kelly [name] MRN: [id number] DOB: 1975-05-10   Cancelled Treatment:    Reason Eval Not Completed: Patient at procedure or test/unavailable--getting PICC line placed.   Jeanie Cooks Bristyl Mclees 06/18/2013, 11:18 AM Pager [phone]

## 2013-06-18 NOTE — Progress Notes (Signed)
Patient ID: Kelly [name], female   DOB: 1975-06-04, 38 y.o.   MRN: [id number]   Pt with Uterine Ca Followed by Dr [name] Receiving Chemo until just a few days ago Suffered STEMI Now at [facility]- heart cath shows inferior wall myocardial infarction - now with new cardiac drug eluding stent placement. Now on Brilinta since 4/7 Port a cath has been requested per Dr Mare Ferrari  Discussed case with Dr Annamaria Boots and Dr [name] and feel best to proceed with PICC for now. Pt can use this for chemo; blood draws; cardiac meds... Will await for recovery from acute MI before undergoing procedure of PAC placement  Dr [name] says there will be a delay in resuming chemo at least several weeks also awaiting  cardiac recovery.  IR would be glad to place [facility] when pt more stable. Can be performed as OP.

## 2013-06-18 NOTE — Progress Notes (Signed)
Patient expressed to me that she would like to talk to social worker or case manager about obtaining health insurance and assistance. Will pass along to night shift RN so that this message will get to the appropriate person. Roxan Hockey

## 2013-06-18 NOTE — Progress Notes (Signed)
Chaplain gave emotional support by engaging with pt and pt's mother in conversation and by exercising empathic listening.  Pt mentioned that the surgery to open coronary artery was successful but that she is also challenged medically by cancer.  She also expressed concern over being separated from her 14 months old child while she is in the hospital.   06/18/13 1000  Clinical Encounter Type  Visited With Patient and family together  Visit Type Spiritual support;Critical Care  Referral From [place], chaplain [phone]

## 2013-06-18 NOTE — Progress Notes (Signed)
OT Cancellation Note  Patient Details Name: Kelly [name] MRN: [id number] DOB: 01/31/1976   Cancelled Treatment:    Reason Eval/Treat Not Completed: Medical issues which prohibited therapy; (Pt's BP dropped to 69/47with cardiac rehab just prior to OT eval attempt and pt wanting to rest)  Sallie Staron A Shar Paez 06/18/2013, 2:32 PM

## 2013-06-18 NOTE — Progress Notes (Addendum)
[place] for Vancomycin, Cefepime  Indication: pneumonia  Allergies  Allergen Reactions  . Amoxicillin Nausea And Vomiting, Swelling and Rash    Rash, vomiting, throat closing Rash, vomiting, throat closing  . Flagyl [Metronidazole] Nausea And Vomiting    04/30/13-Per pt she is unable to tolerate this medication, causes rash 04/30/13-Per pt she is unable to tolerate this medication, causes rash  . Adhesive [Tape] Rash and Dermatitis    "takes skin off" "takes skin off"   Labs:  Recent Labs  06/15/13 2247 06/16/13 0330 06/17/13 0418 06/18/13 0326  WBC  --  6.3 9.9 11.0*  HGB 12.6 9.3* 9.4* 8.5*  PLT  --  262 314 283  CREATININE 0.50 0.42* 0.35*  --    Estimated Creatinine Clearance: 122.1 ml/min (by C-G formula based on Cr of 0.35).  Recent Labs  06/18/13 0728  [place] <5.0*     Microbiology: Recent Results (from the past 720 hour(s))  URINE CULTURE     Status: None   Collection Time    06/04/13  4:56 PM      Result Value Ref Range Status   Urine Culture, Routine Culture, Urine   Final   Comment: ------------------------------------------------------------------------     CCU     Final - ===== COLONY COUNT: =====     NO GROWTH     NO GROWTH  CULTURE, BLOOD (ROUTINE X 2)     Status: None   Collection Time    06/15/13 11:03 PM      Result Value Ref Range Status   Specimen Description BLOOD LEFT ANTECUBITAL   Final   Special Requests BOTTLES DRAWN AEROBIC AND ANAEROBIC 4CC   Final   Culture  Setup Time     Final   Value: 06/16/2013 08:40     Performed at [facility]   Culture     Final   Value:        BLOOD CULTURE RECEIVED NO GROWTH TO DATE CULTURE WILL BE HELD FOR 5 DAYS BEFORE ISSUING A FINAL NEGATIVE REPORT     Performed at [facility]   Report Status PENDING   Incomplete  CULTURE, BLOOD (ROUTINE X 2)     Status: None   Collection Time    06/15/13 11:15 PM      Result Value Ref Range Status   Specimen Description BLOOD LEFT HAND   Final   Special Requests BOTTLES DRAWN AEROBIC AND ANAEROBIC 5CC   Final   Culture  Setup Time     Final   Value: 06/16/2013 08:40     Performed at [facility]   Culture     Final   Value:        BLOOD CULTURE RECEIVED NO GROWTH TO DATE CULTURE WILL BE HELD FOR 5 DAYS BEFORE ISSUING A FINAL NEGATIVE REPORT     Performed at [facility]   Report Status PENDING   Incomplete  MRSA PCR SCREENING     Status: None   Collection Time    06/16/13  2:04 AM      Result Value Ref Range Status   MRSA by PCR NEGATIVE  NEGATIVE Final   Comment:            The GeneXpert MRSA Assay (FDA     approved for NASAL specimens     only), is one component of a     comprehensive MRSA colonization     surveillance program. It is not  intended to diagnose MRSA     infection nor to guide or     monitor treatment for     MRSA infections.  MRSA PCR SCREENING     Status: None   Collection Time    06/17/13  2:32 AM      Result Value Ref Range Status   MRSA by PCR NEGATIVE  NEGATIVE Final   Comment:            The GeneXpert MRSA Assay (FDA     approved for NASAL specimens     only), is one component of a     comprehensive MRSA colonization     surveillance program. It is not     intended to diagnose MRSA     infection nor to guide or     monitor treatment for     MRSA infections.    Medical History: Past Medical History  Diagnosis Date  . Diabetes mellitus without complication   . Obesity   . Seizure disorder     tonic clonic  . Pelvic pain   . Anxiety     no meds  . GERD (gastroesophageal reflux disease)     occasional no meds   . Seizures     trigger by strong odors, last seizure 1 yr ago, no meds    Assessment: 38 year old female continues on vancomycin and cefepime for HCAP Vancomycin trough < 5 Afebrile, WBC stable Cultures negative  Goal of Therapy:  Vancomycin trough level 15-20 mcg/ml Cefepime dosed based on patient  weight and renal function  Plan:  1) Vancomycin 500 mg iv x 1 dose now 2) Increase vancomycin to 1 gram iv Q 8 hours 3) Continue cefepime 2 grams iv Q 24 hours 4) Continue to follow Scr, cultures, fever trend, and for dc of abx  Thank you. Anette Guarneri, PharmD [phone]  06/18/2013,9:53 AM

## 2013-06-19 DIAGNOSIS — N9489 Other specified conditions associated with female genital organs and menstrual cycle: Secondary | ICD-10-CM

## 2013-06-19 LAB — CBC WITH DIFFERENTIAL/PLATELET
Basophils Absolute: 0 10*3/uL (ref 0.0–0.1)
Basophils Relative: 0 % (ref 0–1)
EOS PCT: 1 % (ref 0–5)
Eosinophils Absolute: 0.1 10*3/uL (ref 0.0–0.7)
HCT: 24.8 % — ABNORMAL LOW (ref 36.0–46.0)
Hemoglobin: 8.1 g/dL — ABNORMAL LOW (ref 12.0–15.0)
LYMPHS PCT: 23 % (ref 12–46)
Lymphs Abs: 2.3 10*3/uL (ref 0.7–4.0)
MCH: 19.8 pg — ABNORMAL LOW (ref 26.0–34.0)
MCHC: 32.7 g/dL (ref 30.0–36.0)
MCV: 60.5 fL — ABNORMAL LOW (ref 78.0–100.0)
MONO ABS: 0.6 10*3/uL (ref 0.1–1.0)
MONOS PCT: 6 % (ref 3–12)
Neutro Abs: 7.2 10*3/uL (ref 1.7–7.7)
Neutrophils Relative %: 70 % (ref 43–77)
Platelets: ADEQUATE 10*3/uL (ref 150–400)
RBC: 4.1 MIL/uL (ref 3.87–5.11)
RDW: 18.3 % — ABNORMAL HIGH (ref 11.5–15.5)
WBC: 10.2 10*3/uL (ref 4.0–10.5)

## 2013-06-19 LAB — BASIC METABOLIC PANEL
BUN: 10 mg/dL (ref 6–23)
CHLORIDE: 97 meq/L (ref 96–112)
CO2: 25 mEq/L (ref 19–32)
Calcium: 8.9 mg/dL (ref 8.4–10.5)
Creatinine, Ser: 0.46 mg/dL — ABNORMAL LOW (ref 0.50–1.10)
GFR calc Af Amer: >90 mL/min (ref >90)
GFR calc non Af Amer: >90 mL/min (ref >90)
GLUCOSE: 306 mg/dL — AB (ref 70–99)
POTASSIUM: 4.2 meq/L (ref 3.7–5.3)
Sodium: 136 mEq/L — ABNORMAL LOW (ref 137–147)

## 2013-06-19 LAB — GLUCOSE, CAPILLARY
GLUCOSE-CAPILLARY: 246 mg/dL — AB (ref 70–99)
GLUCOSE-CAPILLARY: 287 mg/dL — AB (ref 70–99)
Glucose-Capillary: 312 mg/dL — ABNORMAL HIGH (ref 70–99)
Glucose-Capillary: 307 mg/dL — ABNORMAL HIGH (ref 70–99)
Glucose-Capillary: 190 mg/dL — ABNORMAL HIGH (ref 70–99)

## 2013-06-19 MED ORDER — INSULIN GLARGINE 100 UNIT/ML ~~LOC~~ SOLN
30.0000 [IU] | Freq: Every day | SUBCUTANEOUS | Status: DC
  Administered 2013-06-19: 30 [IU] via SUBCUTANEOUS
  Filled 2013-06-19 (×2): qty 0.3

## 2013-06-19 MED ORDER — INSULIN ASPART 100 UNIT/ML ~~LOC~~ SOLN
0.0000 [IU] | Freq: Three times a day (TID) | SUBCUTANEOUS | Status: DC
  Administered 2013-06-19: 15 [IU] via SUBCUTANEOUS
  Administered 2013-06-20: 4 [IU] via SUBCUTANEOUS
  Administered 2013-06-20: 15 [IU] via SUBCUTANEOUS

## 2013-06-19 MED ORDER — INSULIN ASPART 100 UNIT/ML ~~LOC~~ SOLN: 0.0000 [IU] | Freq: Every day | SUBCUTANEOUS | Status: DC

## 2013-06-19 MED ORDER — METFORMIN HCL 500 MG PO TABS
500.0000 mg | ORAL_TABLET | Freq: Two times a day (BID) | ORAL | Status: DC
  Filled 2013-06-19 (×3): qty 1

## 2013-06-19 MED ORDER — INSULIN ASPART 100 UNIT/ML ~~LOC~~ SOLN
5.0000 [IU] | Freq: Three times a day (TID) | SUBCUTANEOUS | Status: DC
  Administered 2013-06-19 – 2013-06-20 (×3): 5 [IU] via SUBCUTANEOUS

## 2013-06-19 MED ORDER — INSULIN GLARGINE 100 UNIT/ML ~~LOC~~ SOLN
25.0000 [IU] | Freq: Every day | SUBCUTANEOUS | Status: DC
  Filled 2013-06-19: qty 0.25

## 2013-06-19 NOTE — Progress Notes (Addendum)
[facility] PROGRESS NOTE  Kelly [name] PXT:[id number] DOB: 20-Sep-1975 DOA: 06/15/2013 PCP: [name], PA-C  Assessment/Plan: 1. Type 2 diabetes mellitus, uncontrolled -Hemoglobin A1c 8.8 -Blood sugars remain elevated, increase Lantus to 25 units subcutaneous daily  from 18 units -Started patient on NovoLog 5 units subcutaneous meal time coverage -Continue Accu-Cheks q. a.c. each bedtime with sliding scale coverage -Diabetic coordinator consulted 2.  Question pneumonia -Patient stable, does not appear to have clinical signs or symptoms suggesting pneumonia -Continue holding off on antimicrobial therapy for now 3.  ST elevation myocardial infarction -Status post percutaneous intervention with drug-eluting stents -Cardiac catheterization performed on 06/17/2013 showing proximal RCA extensive thrombus, normal LAD and left circumflex -Continue Aspirin 81 mg by mouth daily, Ticagrelor, Metoprolol -Cardiology signing off 4. endometrial carcinoma -Status post TAH BSO with bilateral pelvic and periodic node evaluation -Patient following Dr. [name] at the [facility] -Receiving Taxol and carboplatin at the [facility] 5. history of seizure disorder -Stable, continue Tegretol  Code Status: Full code Family Communication:  Disposition Plan: Anticipate discharge in the next 24-48 hours   Consultants:  Cardiology  Medical oncology  Procedures:  Cardiac catheterization performed on 06/17/2013  Antibiotics:  None  HPI/Subjective: 38 yo female with recent diagnosis of endometrial cancer who under went total hysterectomy on 3/6 at [facility] by Dr. [name]. She is receiving chemotherapy at [facility] She has a history of DM X 7 years, and Seizure disorder. Unfortunately, her husband passed just 3 months ago and she has an infant at home.  She was admitted to [facility] hospital by the Hospitalists with presumed pneumonia, as she has a nodular opacity over the  left lower lobe. She developed STEMI and was transferred to [facility] on the Cardiology service, and was treated with thrombectomy and a drug eluting stent to her RCA.    Objective: Filed Vitals:   06/19/13 1353  BP: 102/72  Pulse: 110  Temp: 98.1 F (36.7 C)  Resp: 18    Intake/Output Summary (Last 24 hours) at 06/19/13 1359 Last data filed at 06/19/13 1300  Gross per 24 hour  Intake   1080 ml  Output    851 ml  Net    229 ml   Filed Weights   06/16/13 1410 06/17/13 0130 06/18/13 0412  Weight: 110.133 kg (242 lb 12.8 oz) 111.6 kg (246 lb 0.5 oz) 111.7 kg (246 lb 4.1 oz)    Exam:   General:  Patient is in no acute distress, denies chest pain or shortness of breath  Cardiovascular: Regular rate rhythm normal S1-S2  Respiratory: Clear to auscultation bilaterally  Abdomen: Soft nontender nondistended  Musculoskeletal: No edema  Data Reviewed: Basic Metabolic Panel:  Recent Labs Lab 06/15/13 2238 06/15/13 2247 06/16/13 0330 06/17/13 0418 06/19/13 0620  NA 130* 134* 131* 136* 136*  K 4.0 4.0 4.3 4.3 4.2  CL 91* 97 96 97 97  CO2 21  --  24 23 25   GLUCOSE 439* 442* 341* 293* 306*  BUN 9 7 8 6 10   CREATININE 0.45* 0.50 0.42* 0.35* 0.46*  CALCIUM 10.2  --  8.7 8.5 8.9   Liver Function Tests:  Recent Labs Lab 06/15/13 2238 06/16/13 0330  AST 7 7  ALT 12 10  ALKPHOS 141* 126*  BILITOT 0.7 0.8  PROT 8.3 7.2  ALBUMIN 3.7 3.4*   No results found for this basename: LIPASE, AMYLASE,  in the last 168 hours No results found for this basename: AMMONIA,  in the last  168 hours CBC:  Recent Labs Lab 06/15/13 2238 06/15/13 2247 06/16/13 0330 06/17/13 0418 06/18/13 0326 06/19/13 0620  WBC 6.7  --  6.3 9.9 11.0* 10.2  NEUTROABS 4.2  --  4.6  --  8.0* 7.2  HGB 10.3* 12.6 9.3* 9.4* 8.5* 8.1*  HCT 31.5* 37.0 29.3* 28.3* 25.7* 24.8*  MCV 59.2*  --  60.7* 60.0* 60.5* 60.5*  PLT 373  --  262 314 283 PLATELET CLUMPS NOTED ON SMEAR, COUNT APPEARS ADEQUATE   Cardiac  Enzymes:  Recent Labs Lab 06/16/13 0330 06/16/13 0810 06/16/13 1501 06/16/13 2155 06/17/13 2004  TROPONINI <0.30 <0.30 0.88* 4.04* 15.99*   BNP (last 3 results)  Recent Labs  06/16/13 0810  PROBNP 5.6   CBG:  Recent Labs Lab 06/18/13 1704 06/18/13 2052 06/19/13 0027 06/19/13 0623 06/19/13 1121  GLUCAP 204* 235* 246* 287* 312*    Recent Results (from the past 240 hour(s))  CULTURE, BLOOD (ROUTINE X 2)     Status: None   Collection Time    06/15/13 11:03 PM      Result Value Ref Range Status   Specimen Description BLOOD LEFT ANTECUBITAL   Final   Special Requests BOTTLES DRAWN AEROBIC AND ANAEROBIC 4CC   Final   Culture  Setup Time     Final   Value: 06/16/2013 08:40     Performed at [facility]   Culture     Final   Value:        BLOOD CULTURE RECEIVED NO GROWTH TO DATE CULTURE WILL BE HELD FOR 5 DAYS BEFORE ISSUING A FINAL NEGATIVE REPORT     Performed at [facility]   Report Status PENDING   Incomplete  CULTURE, BLOOD (ROUTINE X 2)     Status: None   Collection Time    06/15/13 11:15 PM      Result Value Ref Range Status   Specimen Description BLOOD LEFT HAND   Final   Special Requests BOTTLES DRAWN AEROBIC AND ANAEROBIC 5CC   Final   Culture  Setup Time     Final   Value: 06/16/2013 08:40     Performed at [facility]   Culture     Final   Value:        BLOOD CULTURE RECEIVED NO GROWTH TO DATE CULTURE WILL BE HELD FOR 5 DAYS BEFORE ISSUING A FINAL NEGATIVE REPORT     Performed at [facility]   Report Status PENDING   Incomplete  MRSA PCR SCREENING     Status: None   Collection Time    06/16/13  2:04 AM      Result Value Ref Range Status   MRSA by PCR NEGATIVE  NEGATIVE Final   Comment:            The GeneXpert MRSA Assay (FDA     approved for NASAL specimens     only), is one component of a     comprehensive MRSA colonization     surveillance program. It is not     intended to diagnose MRSA     infection nor to  guide or     monitor treatment for     MRSA infections.  MRSA PCR SCREENING     Status: None   Collection Time    06/17/13  2:32 AM      Result Value Ref Range Status   MRSA by PCR NEGATIVE  NEGATIVE Final   Comment:  The GeneXpert MRSA Assay (FDA     approved for NASAL specimens     only), is one component of a     comprehensive MRSA colonization     surveillance program. It is not     intended to diagnose MRSA     infection nor to guide or     monitor treatment for     MRSA infections.     Studies: Dg Chest Port 1 View  06/18/2013   CLINICAL DATA:  Status post PICC line placement  EXAM: PORTABLE CHEST - 1 VIEW  COMPARISON:  None.  FINDINGS: The patient has undergone right-sided PICC line placement. The tip of the catheter lies in the region of the junction of the SVC with the right atrium. The lungs are hypoinflated. The cardiac silhouette is mildly enlarged. The perihilar interstitial markings are prominent.  IMPRESSION: There is no evidence of immediate postprocedure complication following placement of a PICC line via the right upper extremity.  These results were called by telephone at the time of interpretation on 06/18/2013 at 1:06 PM to Terese Door, RN, who verbally acknowledged these results.   Electronically Signed   By: David  Martinique   On: 06/18/2013 13:07   Dg Abd Portable 2v  06/18/2013   CLINICAL DATA:  Abdominal tenderness  EXAM: PORTABLE ABDOMEN - 2 VIEW  COMPARISON:  CT abdomen and pelvis March 27, 2013  FINDINGS: Supine and left lateral decubitus abdomen images were obtained. The bowel gas pattern is unremarkable. No obstruction or free air is appreciable. There are a few small presumed phleboliths in the pelvis.  IMPRESSION: The bowel gas pattern is unremarkable.   Electronically Signed   By: Lowella Grip M.D.   On: 06/18/2013 17:56    Scheduled Meds: . antiseptic oral rinse  15 mL Mouth Rinse QID  . aspirin EC  81 mg Oral Daily  . atorvastatin  80 mg  Oral q1800  . docusate sodium  100 mg Oral BID  . ferrous gluconate  324 mg Oral BID  . insulin aspart  0-20 Units Subcutaneous TID WC  . insulin aspart  0-5 Units Subcutaneous QHS  . insulin aspart  5 Units Subcutaneous TID WC  . insulin glargine  25 Units Subcutaneous QHS  . metoprolol tartrate  25 mg Oral BID  . multivitamin with minerals  1 tablet Oral Daily  . OXcarbazepine  150 mg Oral Daily  . pantoprazole  40 mg Oral Daily  . polyethylene glycol  17 g Oral Daily  . senna-docusate  2 tablet Oral BID  . sodium chloride  10-40 mL Intracatheter Q12H  . Ticagrelor  90 mg Oral BID   Continuous Infusions:   Principal Problem:   Chest pain Active Problems:   SEIZURE DISORDER   HCAP (healthcare-associated pneumonia)   Shortness of breath   Diabetes mellitus type 2, uncontrolled   STEMI (ST elevation myocardial infarction)    Time spent: 35 min    [facility] Pager [phone]. If 7PM-7AM, please contact night-coverage at [email or web address], password [facility] 06/19/2013, 1:59 PM  LOS: 4 days

## 2013-06-19 NOTE — Progress Notes (Addendum)
Subjective:  No CP/SOB  Objective:  Temp:  [97.7 F (36.5 C)-99.3 F (37.4 C)] 97.7 F (36.5 C) (04/09 0700) Pulse Rate:  [98-103] 98 (04/09 0700) Resp:  [18] 18 (04/09 0700) BP: (90-115)/(55-74) 115/68 mmHg (04/09 0700) SpO2:  [96 %-100 %] 100 % (04/09 0700) Weight change:   Intake/Output from previous day: 04/08 0701 - 04/09 0700 In: 1630 [P.O.:1080; IV Piggyback:550] Out: 1600 [Urine:1600]  Intake/Output from this shift:    Physical Exam: General appearance: alert and no distress Neck: no adenopathy, no carotid bruit, no JVD, supple, symmetrical, trachea midline and thyroid not enlarged, symmetric, no tenderness/mass/nodules Lungs: clear to auscultation bilaterally Heart: regular rate and rhythm, S1, S2 normal, no murmur, click, rub or gallop Extremities: extremities normal, atraumatic, no cyanosis or edema and Right groin puncture site OK  Lab Results: Results for orders placed during the hospital encounter of 06/15/13 (from the past 48 hour(s))  GLUCOSE, CAPILLARY     Status: Abnormal   Collection Time    06/17/13 12:04 PM      Result Value Ref Range   Glucose-Capillary 258 (*) 70 - 99 mg/dL  GLUCOSE, CAPILLARY     Status: Abnormal   Collection Time    06/17/13  4:35 PM      Result Value Ref Range   Glucose-Capillary 294 (*) 70 - 99 mg/dL  TROPONIN I     Status: Abnormal   Collection Time    06/17/13  8:04 PM      Result Value Ref Range   Troponin I 15.99 (*) <0.30 ng/mL   Comment:            Due to the release kinetics of cTnI,     a negative result within the first hours     of the onset of symptoms does not rule out     myocardial infarction with certainty.     If myocardial infarction is still suspected,     repeat the test at appropriate intervals.     CRITICAL RESULT CALLED TO, READ BACK BY AND VERIFIED WITH:     M KUFFOUL,RN 06/17/13 2119 SHIPMAN M  GLUCOSE, CAPILLARY     Status: Abnormal   Collection Time    06/17/13  9:55 PM      Result  Value Ref Range   Glucose-Capillary 382 (*) 70 - 99 mg/dL  CBC WITH DIFFERENTIAL     Status: Abnormal   Collection Time    06/18/13  3:26 AM      Result Value Ref Range   WBC 11.0 (*) 4.0 - 10.5 K/uL   RBC 4.25  3.87 - 5.11 MIL/uL   Hemoglobin 8.5 (*) 12.0 - 15.0 g/dL   HCT 25.7 (*) 36.0 - 46.0 %   MCV 60.5 (*) 78.0 - 100.0 fL   MCH 20.0 (*) 26.0 - 34.0 pg   MCHC 33.1  30.0 - 36.0 g/dL   RDW 18.4 (*) 11.5 - 15.5 %   Platelets 283  150 - 400 K/uL   Neutrophils Relative % 72  43 - 77 %   Lymphocytes Relative 23  12 - 46 %   Monocytes Relative 4  3 - 12 %   Eosinophils Relative 1  0 - 5 %   Basophils Relative 0  0 - 1 %   Neutro Abs 8.0 (*) 1.7 - 7.7 K/uL   Lymphs Abs 2.5  0.7 - 4.0 K/uL   Monocytes Absolute 0.4  0.1 - 1.0 K/uL  Eosinophils Absolute 0.1  0.0 - 0.7 K/uL   Basophils Absolute 0.0  0.0 - 0.1 K/uL   Smear Review MORPHOLOGY UNREMARKABLE    PROCALCITONIN     Status: None   Collection Time    06/18/13  3:26 AM      Result Value Ref Range   Procalcitonin 0.10     Comment:            Interpretation:     PCT (Procalcitonin) <= 0.5 ng/mL:     Systemic infection (sepsis) is not likely.     Local bacterial infection is possible.     (NOTE)             ICU PCT Algorithm               Non ICU PCT Algorithm        ----------------------------     ------------------------------             PCT < 0.25 ng/mL                 PCT < 0.1 ng/mL         Stopping of antibiotics            Stopping of antibiotics           strongly encouraged.               strongly encouraged.        ----------------------------     ------------------------------           PCT level decrease by               PCT < 0.25 ng/mL           >= 80% from peak PCT           OR PCT 0.25 - 0.5 ng/mL          Stopping of antibiotics                                                 encouraged.         Stopping of antibiotics               encouraged.        ----------------------------      ------------------------------           PCT level decrease by              PCT >= 0.25 ng/mL           < 80% from peak PCT            AND PCT >= 0.5 ng/mL            Continuing antibiotics                                                  encouraged.           Continuing antibiotics                encouraged.        ----------------------------     ------------------------------         PCT level increase compared  PCT > 0.5 ng/mL             with peak PCT AND              PCT >= 0.5 ng/mL             Escalation of antibiotics                                              strongly encouraged.          Escalation of antibiotics            strongly encouraged.  VANCOMYCIN, TROUGH     Status: Abnormal   Collection Time    06/18/13  7:28 AM      Result Value Ref Range   Vancomycin Tr <5.0 (*) 10.0 - 20.0 ug/mL   Comment: REPEATED TO VERIFY  GLUCOSE, CAPILLARY     Status: Abnormal   Collection Time    06/18/13  8:03 AM      Result Value Ref Range   Glucose-Capillary 337 (*) 70 - 99 mg/dL  GLUCOSE, CAPILLARY     Status: Abnormal   Collection Time    06/18/13 12:58 PM      Result Value Ref Range   Glucose-Capillary 243 (*) 70 - 99 mg/dL  GLUCOSE, CAPILLARY     Status: Abnormal   Collection Time    06/18/13  5:04 PM      Result Value Ref Range   Glucose-Capillary 204 (*) 70 - 99 mg/dL  GLUCOSE, CAPILLARY     Status: Abnormal   Collection Time    06/18/13  8:52 PM      Result Value Ref Range   Glucose-Capillary 235 (*) 70 - 99 mg/dL   Comment 1 Documented in Chart     Comment 2 Notify RN    GLUCOSE, CAPILLARY     Status: Abnormal   Collection Time    06/19/13 12:27 AM      Result Value Ref Range   Glucose-Capillary 246 (*) 70 - 99 mg/dL   Comment 1 Notify RN     Comment 2 Documented in Chart    CBC WITH DIFFERENTIAL     Status: Abnormal (Preliminary result)   Collection Time    06/19/13  6:20 AM      Result Value Ref Range   WBC PENDING  4.0 - 10.5 K/uL   RBC 4.10   3.87 - 5.11 MIL/uL   Hemoglobin 8.1 (*) 12.0 - 15.0 g/dL   HCT 24.8 (*) 36.0 - 46.0 %   MCV 60.5 (*) 78.0 - 100.0 fL   MCH 19.8 (*) 26.0 - 34.0 pg   MCHC 32.7  30.0 - 36.0 g/dL   RDW 18.3 (*) 11.5 - 15.5 %   Platelets PENDING  150 - 400 K/uL   Neutro Abs PENDING  1.7 - 7.7 K/uL   Lymphs Abs PENDING  0.7 - 4.0 K/uL   Monocytes Absolute PENDING  0.1 - 1.0 K/uL   Eosinophils Absolute PENDING  0.0 - 0.7 K/uL   Basophils Absolute PENDING  0.0 - 0.1 K/uL   Neutrophils Relative % 70  43 - 77 %   Lymphocytes Relative 23  12 - 46 %   Monocytes Relative 6  3 - 12 %   Eosinophils Relative 1  0 - 5 %   Basophils Relative  0  0 - 1 %   RBC Morphology POLYCHROMASIA PRESENT     Smear Review LARGE PLATELETS PRESENT    BASIC METABOLIC PANEL     Status: Abnormal   Collection Time    06/19/13  6:20 AM      Result Value Ref Range   Sodium 136 (*) 137 - 147 mEq/L   Potassium 4.2  3.7 - 5.3 mEq/L   Chloride 97  96 - 112 mEq/L   CO2 25  19 - 32 mEq/L   Glucose, Bld 306 (*) 70 - 99 mg/dL   BUN 10  6 - 23 mg/dL   Creatinine, Ser 0.46 (*) 0.50 - 1.10 mg/dL   Calcium 8.9  8.4 - 10.5 mg/dL   GFR calc non Af Amer >90  >90 mL/min   GFR calc Af Amer >90  >90 mL/min   Comment: (NOTE)     The eGFR has been calculated using the CKD EPI equation.     This calculation has not been validated in all clinical situations.     eGFR's persistently <90 mL/min signify possible Chronic Kidney     Disease.  GLUCOSE, CAPILLARY     Status: Abnormal   Collection Time    06/19/13  6:23 AM      Result Value Ref Range   Glucose-Capillary 287 (*) 70 - 99 mg/dL    Imaging: Imaging results have been reviewed  Assessment/Plan:   1. Principal Problem: 2.   Chest pain 3. Active Problems: 4.   SEIZURE DISORDER 5.   HCAP (healthcare-associated pneumonia) 6.   Shortness of breath 7.   Diabetes mellitus type 2, uncontrolled 8.   STEMI (ST elevation myocardial infarction) 9.   Time Spent Directly with Patient:  20  minutes  Length of Stay:  LOS: 4 days   Day #2 s/p inferior STEMI Rx with PCI/Stent with DES. Left system OK. On appropriate meds. Exam benign. Pt can be D/Cd home from our point of view tomorrow with OP follow up with Dr. Claiborne Billings . On DAPT.  Lorretta Harp 06/19/2013, 9:32 AM

## 2013-06-19 NOTE — Progress Notes (Signed)
[id number] Cardiac Rehab Started MI and stent education with pt. We discussed MI, stent, Brilinta, risk factors, heart healthy and diabetic diet also Outpt. CRP. Pt voices understanding. She is very interested in Outpt. CRP. We will follow pt tomorrow to complete education. Deon Pilling, RN 06/19/2013 3:57 PM

## 2013-06-19 NOTE — Progress Notes (Signed)
MEDICAL ONCOLOGY 06/19/2013, 11:13 AM  Hospital day 5 Antibiotics: cefepime and vanc DCd 06-18-13 Chemotherapy: day 9 cycle 1 carbo taxol (no gCSF)  Patient seen now on 2W, nursing staff present. I spoke with Dr Adora Fridge on unit, cardiology signed off for now and will follow her after DC.   Subjective: Feeling better overall today, after symptomatic with low BP yesterday. Bowels finally moved well and has been able to eat, no nausea. SOB improved, no cough. No chest pain. PICC placed 06-18-13. Skin irritated from tape at groin cath site, known tape and latex allergy, may need different PICC dressing due to this. Patient anxious about findings on CT (angio) chest from admission, with minimal nonspecific changes LLL. I have told her that we will follow up that area, but that we do not know that this is related to cancer. Patient has requested to speak with social worker and I have placed that order. [facility] staff also aware of her but have not met with her yet.  Objective: Vital signs in last 24 hours: Blood pressure 115/68, pulse 98, temperature 97.7 F (36.5 C), temperature source Oral, resp. rate 18, height 5' 6"  (1.676 m), weight 246 lb 4.1 oz (111.7 kg), last menstrual period 02/24/2013, SpO2 100.00%.   Intake/Output from previous day: 04/08 0701 - 04/09 0700 In: 1630 [P.O.:1080; IV Piggyback:550] Out: 1600 [Urine:1600] Intake/Output this shift:    Physical exam: awake, alert, ambulatory BR to bed without assistance, slightly dyspneic with this activity. PERRL, not icteric. Oral mucosa moist and clear, no ulcerations. No supraclavicular adenopathy. Lungs clear to A and P to bases bilaterally. Heart RRR. PICC RUE insertion site ok, no swelling or tenderness, no irritation under or around dressing now. Abdomen obese, soft, nontender LE no edema, cords, tenderness. Neuro nonfocal Psych appropriate mood and affect.  Lab Results:  Recent Labs  06/18/13 0326 06/19/13 0620  WBC  11.0* 10.2  HGB 8.5* 8.1*  HCT 25.7* 24.8*  PLT 283 PLATELET CLUMPS NOTED ON SMEAR, COUNT APPEARS ADEQUATE   BMET  Recent Labs  06/17/13 0418 06/19/13 0620  NA 136* 136*  K 4.3 4.2  CL 97 97  CO2 23 25  GLUCOSE 293* 306*  BUN 6 10  CREATININE 0.35* 0.46*  CALCIUM 8.5 8.9    Studies/Results: Dg Chest Port 1 View  06/18/2013   CLINICAL DATA:  Status post PICC line placement  EXAM: PORTABLE CHEST - 1 VIEW  COMPARISON:  None.  FINDINGS: The patient has undergone right-sided PICC line placement. The tip of the catheter lies in the region of the junction of the SVC with the right atrium. The lungs are hypoinflated. The cardiac silhouette is mildly enlarged. The perihilar interstitial markings are prominent.  IMPRESSION: There is no evidence of immediate postprocedure complication following placement of a PICC line via the right upper extremity.  These results were called by telephone at the time of interpretation on 06/18/2013 at 1:06 PM to Terese Door, RN, who verbally acknowledged these results.   Electronically Signed   By: David  Martinique   On: 06/18/2013 13:07   Dg Abd Portable 2v  06/18/2013   CLINICAL DATA:  Abdominal tenderness  EXAM: PORTABLE ABDOMEN - 2 VIEW  COMPARISON:  CT abdomen and pelvis March 27, 2013  FINDINGS: Supine and left lateral decubitus abdomen images were obtained. The bowel gas pattern is unremarkable. No obstruction or free air is appreciable. There are a few small presumed phleboliths in the pelvis.  IMPRESSION: The bowel gas  pattern is unremarkable.   Electronically Signed   By: Lowella Grip M.D.   On: 06/18/2013 17:56     Assessment/Plan: 1. Acute inferior STEMI 06-17-13: cath with RCA thrombectomy and stent. Cardiology has signed off in hospital, will follow outpatient. 2. Endometrial carcinoma: presentation and diagnosis Jan 2015, post robotic assisted laparoscopic TAH BSO and bilateral pelvic and periaortic node evaluation by Dr Nancy Marus at [facility]  05-16-2013 IIIA grade 2. First adjuvant taxol carboplatin at [facility] 06-11-13. Blood counts still good, not yet at nadir. Has not had gCSF. Will follow counts daily now, and add gCSF if WBC appears to be dropping significantly. Taxol aches in legs resolved, no nausea now.  2.Diabetes: poorly controlled PTA and still CBGs 200-300.Marland Kitchen Needs home glucometer at DC. 3. Antibiotics DCd, having been started on admission for questionable findings on chest CT. No clinical pulmonary infection  4.Constipation: with recent gyn surgery, chemo, pain meds. Improved Continue laxatives 5.Seizure disorder since age 89: may have had post op seizure at [facility], seen then by [facility] neurology and begun on trileptal  6. Iron deficiency anemia related to heavy gyn blood loss, now on ferrous gluconate for less GI side effects. Hemoglobin lower today, post chemo and cath etc. Not obviously symptomatic.Daily counts.  7.Obesity: intentional weight loss of 100 lbs since 2012, mostly with exercise.  8. Peripheral IV access difficult  PICC placed 06-18-13 9.Social concerns: recently widowed, I believe shortly after adopting now 34 month old daughter. Mother and sister assisting with the infant. 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 Financial difficulty obtaining medicines prior to first chemo, which my office did not realize until afterwards. 10.GERD, on protonix  11.Sickle trait: found on recent hemoglobin electrophoresis.     Shambhavi Salley P Rudy Luhmann [phone] Pager [phone]

## 2013-06-19 NOTE — Evaluation (Signed)
Physical Therapy Evaluation Patient Details Name: Kelly [name] MRN: [id number] DOB: 1975-11-24 Today's Date: 06/19/2013   History of Present Illness  Kelly [name] is a 38 y.o. female with recently diagnosed endometrial carcinoma status post surgery and had received chemotherapy last Thursday 4 days ago started developing chest pain and shortness of breath or palpitations last evening. Patient also has been having lower extremity numbness with weakness since chemotherapy and also developed left upper extremity numbness since last night. In the ER patient was found to be very tachycardic and had CT angiogram of the chest which was negative for pulmonary embolism and had shown features concerning for possible pneumonia, inflammatory process versus drug toxicity versus metastatic process. Patient denies having any productive cough fever chills. Has some nausea. Denies any abdominal pain diarrhea. Since surgery patient has been having some vaginal discharge which patient states her surgeon has stated was normal. Patient has been empirically started on antibiotics for possible pneumonia and admitted for further management. On exam patient is able to move all extremities. Denies any incontinence of urine or bowel.  Clinical Impression  Pt admitted with/for CP/SOB and found to have suffered an STEMI.  Pt currently limited functionally due to the problems listed below.  (see problems list.)  Pt will benefit from PT to maximize function and safety to be able to get home safely with available assist of family.  Pt hope to get some leads on help at home and financial assistance.     Follow Up Recommendations Home health PT    Equipment Recommendations  Rolling walker with 5" wheels    Recommendations for Other Services       Precautions / Restrictions Precautions Precautions: Fall Restrictions Weight Bearing Restrictions: No      Mobility  Bed Mobility Overal bed mobility: Needs  Assistance Bed Mobility: Supine to Sit     Supine to sit: Supervision     General bed mobility comments: mild struggle, but no rail or assistance needed.  Transfers Overall transfer level: Needs assistance   Transfers: Sit to/from Stand Sit to Stand: Min guard         General transfer comment: wide stance, asymmetrical stand due to painful R LE, but no assist given  Ambulation/Gait Ambulation/Gait assistance: Min guard Ambulation Distance (Feet): 100 Feet Assistive device: Rolling walker (2 wheeled) Gait Pattern/deviations: Step-through pattern;Wide base of support Gait velocity: slow   General Gait Details: Antalgic gait on R with foot flat contact which improved with gait.  Stairs            Wheelchair Mobility    Modified Rankin (Stroke Patients Only)       Balance Overall balance assessment: No apparent balance deficits (not formally assessed);Needs assistance   Sitting balance-Leahy Scale: Normal     Standing balance support: No upper extremity supported Standing balance-Leahy Scale: Fair                               Pertinent Vitals/Pain     Home Living Family/patient expects to be discharged to:: Private residence Living Arrangements: Other (Comment) (sister mother) Available Help at Discharge: Family;Other (Comment) (her assistance resources are stretched thin) Type of Home: House Home Access: Stairs to enter Entrance Stairs-Rails: None Entrance Stairs-Number of Steps: 4 Home Layout: Multi-level Home Equipment:  (TBA)      Prior Function Level of Independence: Independent  Hand Dominance        Extremity/Trunk Assessment   Upper Extremity Assessment: Overall WFL for tasks assessed (mildly weak but nicely functional)           Lower Extremity Assessment: Overall WFL for tasks assessed;RLE deficits/detail RLE Deficits / Details: hip flexion 2+/5, o/w 3+/5 due to pain       Communication    Communication: No difficulties  Cognition Arousal/Alertness: Awake/alert Behavior During Therapy: WFL for tasks assessed/performed Overall Cognitive Status: Within Functional Limits for tasks assessed                      General Comments      Exercises        Assessment/Plan    PT Assessment Patient needs continued PT services  PT Diagnosis Difficulty walking;Acute pain   PT Problem List Decreased strength;Decreased activity tolerance;Decreased mobility;Decreased knowledge of use of DME;Cardiopulmonary status limiting activity;Pain  PT Treatment Interventions Gait training;Stair training;Functional mobility training;Therapeutic activities;Patient/family education   PT Goals (Current goals can be found in the Care Plan section) Acute Rehab PT Goals PT Goal Formulation: With patient Time For Goal Achievement: 06/26/13 Potential to Achieve Goals: Good    Frequency Min 3X/week   Barriers to discharge Decreased caregiver support      Co-evaluation               End of Session   Activity Tolerance: Patient tolerated treatment well Patient left: in bed;with call bell/phone within reach;with family/visitor present Nurse Communication: Mobility status         Time: 1128-1200 PT Time Calculation (min): 32 min   Charges:   PT Evaluation $Initial PT Evaluation Tier I: 1 Procedure PT Treatments $Gait Training: 8-22 mins $Therapeutic Activity: 8-22 mins   PT G CodesTessie Fass Dexter Sauser 06/19/2013, 12:16 PM 06/19/2013  Donnella Sham, PT [phone]  (pager)

## 2013-06-19 NOTE — Progress Notes (Signed)
Inpatient Diabetes Program Recommendations  AACE/ADA: New Consensus Statement on Inpatient Glycemic Control (2013)  Target Ranges:  Prepandial:   less than 140 mg/dL      Peak postprandial:   less than 180 mg/dL (1-2 hours)      Critically ill patients:  140 - 180 mg/dL    Inpatient Diabetes Program Recommendations Insulin - Basal: xxxxxxx Correction (SSI): Please add HS correction insulin scale Insulin - Meal Coverage: Please add 4 units meal coverage tidwc (in addition to correction) Oral Agents: D/C oral agents while in hospital HgbA1C: 8.8% - uncontrolled Outpatient Referral: OP Diabetes Education consult for uncontrolled DM Thank you, Rosita Kea, RN, CNS, Diabetes Coordinator [phone])

## 2013-06-19 NOTE — Evaluation (Signed)
Occupational Therapy Evaluation Patient Details Name: Kelly [name] MRN: [id number] DOB: Apr 24, 1975 Today's Date: 06/19/2013    History of Present Illness Kelly [name] is a 38 y.o. female with recently diagnosed endometrial carcinoma status post surgery and had received chemotherapy last Thursday 4 days ago started developing chest pain and shortness of breath or palpitations last evening. Patient also has been having lower extremity numbness with weakness since chemotherapy and also developed left upper extremity numbness since last night. In the ER patient was found to be very tachycardic and had CT angiogram of the chest which was negative for pulmonary embolism and had shown features concerning for possible pneumonia, inflammatory process versus drug toxicity versus metastatic process. Patient denies having any productive cough fever chills. Has some nausea. Denies any abdominal pain diarrhea. Since surgery patient has been having some vaginal discharge which patient states her surgeon has stated was normal. Patient has been empirically started on antibiotics for possible pneumonia and admitted for further management. On exam patient is able to move all extremities. Denies any incontinence of urine or bowel.   Clinical Impression   Pt demonstrates decline in function ans safety with ADLs and ADL mobility safety with decreased strength and endurance. Pt would benefit from acute OT services to address impairments to increase level of function and safety. Pt has a 5 month old daughter, husband ha passed away and states that her medications are a financial burden    Follow Up Recommendations  Home health OT    Equipment Recommendations  Tub/shower bench;Other (comment) (ADL A/E)    Recommendations for Other Services       Precautions / Restrictions Precautions Precautions: Fall Restrictions Weight Bearing Restrictions: No      Mobility Bed Mobility Overal bed  mobility: Needs Assistance Bed Mobility: Supine to Sit;Sit to Supine     Supine to sit: Supervision Sit to supine: Supervision   General bed mobility comments: mild struggle, but no rail or assistance needed.  Transfers Overall transfer level: Needs assistance Equipment used: 1 person hand held assist;Rolling walker (2 wheeled) Transfers: Sit to/from Stand Sit to Stand: Min guard         General transfer comment: wide stance, asymmetrical stand due to painful R LE, but no assist given    Balance Overall balance assessment: No apparent balance deficits (not formally assessed);Needs assistance   Sitting balance-Leahy Scale: Normal     Standing balance support: No upper extremity supported Standing balance-Leahy Scale: Fair                              ADL Overall ADL's : Needs assistance/impaired     Grooming: Wash/dry hands;Wash/dry face;Sitting;Min guard   Upper Body Bathing: Supervision/ safety;Set up;Sitting;Standing;Min guard   Lower Body Bathing: Minimal assistance;Sitting/lateral leans;Sit to/from stand;Supervison/ safety;Set up   Upper Body Dressing : Supervision/safety;Set up;Min guard;Sitting;Standing   Lower Body Dressing: Supervision/safety;Set up;Min guard;Sitting/lateral leans;Sit to/from stand   Toilet Transfer: Grab bars;RW;Regular Toilet;Min guard   Toileting- Water quality scientist and Hygiene: Min guard;Sit to/from stand   Tub/ Banker: Min guard;Grab bars   Functional mobility during ADLs: Min guard General ADL Comments: Pt fatigues easily, dizziness     Vision  wears glasses at all times                   Perception Perception Perception Tested?: No   Praxis Praxis Praxis tested?: Not tested    Pertinent Vitals/Pain 5/10 pain surgical  site at rest, 8/10 with activity. VSS     Hand Dominance Right   Extremity/Trunk Assessment Upper Extremity Assessment Upper Extremity Assessment: Generalized weakness    Lower Extremity Assessment Lower Extremity Assessment: Defer to PT evaluation RLE Deficits / Details: hip flexion 2+/5, o/w 3+/5 due to pain   Cervical / Trunk Assessment Cervical / Trunk Assessment: Normal   Communication Communication Communication: No difficulties   Cognition Arousal/Alertness: Awake/alert Behavior During Therapy: WFL for tasks assessed/performed Overall Cognitive Status: Within Functional Limits for tasks assessed                     General Comments   Pt very pleasant, cooperative and motivated                 Home Living Family/patient expects to be discharged to:: Private residence Living Arrangements: Other relatives Available Help at Discharge: Family Type of Home: House Home Access: Stairs to enter Technical brewer of Steps: 4 Entrance Stairs-Rails: None Home Layout: Multi-level Alternate Level Stairs-Number of Steps: 12 Alternate Level Stairs-Rails: Right;Left Bathroom Shower/Tub: Tub/shower unit Shower/tub characteristics: Architectural technologist: Standard     Home Equipment: None          Prior Functioning/Environment Level of Independence: Independent             OT Diagnosis: Generalized weakness;Acute pain   OT Problem List: Decreased strength;Decreased knowledge of use of DME or AE;Decreased activity tolerance;Impaired balance (sitting and/or standing);Pain   OT Treatment/Interventions: Self-care/ADL training;Therapeutic exercise;Neuromuscular education;Therapeutic activities;Energy conservation;DME and/or AE instruction;Patient/family education    OT Goals(Current goals can be found in the care plan section) Acute Rehab OT Goals Patient Stated Goal: to get stroger and go home OT Goal Formulation: With patient/family Time For Goal Achievement: 06/26/13 Potential to Achieve Goals: Good ADL Goals Pt Will Perform Grooming: with set-up;with supervision;standing Pt Will Perform Lower Body Bathing: with  set-up;with supervision;sit to/from stand;with adaptive equipment Pt Will Perform Lower Body Dressing: with set-up;with supervision;sit to/from stand;with adaptive equipment Pt Will Transfer to Toilet: with supervision;regular height toilet;ambulating;grab bars Pt Will Perform Toileting - Clothing Manipulation and hygiene: with supervision;sit to/from stand Pt Will Perform Tub/Shower Transfer: with supervision;ambulating;shower seat;grab bars Additional ADL Goal #1: pt will verbalize/demonstrate 3-5 energy conservation techniques for ADLs/ light home mgt tasks  OT Frequency: Min 2X/week   Barriers to D/C:    none, family very supportive                     End of Session Equipment Utilized During Treatment: Gait belt;Rolling walker  Activity Tolerance: Patient limited by fatigue Patient left: in bed;with call bell/phone within reach;with family/visitor present   Time: 3244-0102 OT Time Calculation (min): 23 min Charges:  OT General Charges $OT Visit: 1 Procedure OT Evaluation $Initial OT Evaluation Tier I: 1 Procedure OT Treatments $Therapeutic Activity: 8-22 mins G-Codes:    Mosetta Putt 06/28/2013, 3:41 PM

## 2013-06-20 ENCOUNTER — Telehealth: Payer: Self-pay | Admitting: Oncology

## 2013-06-20 ENCOUNTER — Other Ambulatory Visit: Payer: Self-pay | Admitting: Oncology

## 2013-06-20 ENCOUNTER — Encounter: Payer: Self-pay | Admitting: Radiation Oncology

## 2013-06-20 ENCOUNTER — Other Ambulatory Visit: Payer: Self-pay

## 2013-06-20 ENCOUNTER — Inpatient Hospital Stay (HOSPITAL_COMMUNITY): Payer: No Typology Code available for payment source

## 2013-06-20 DIAGNOSIS — Y9210 Unspecified residential institution as the place of occurrence of the external cause: Secondary | ICD-10-CM

## 2013-06-20 DIAGNOSIS — R1110 Vomiting, unspecified: Secondary | ICD-10-CM

## 2013-06-20 DIAGNOSIS — Z9861 Coronary angioplasty status: Secondary | ICD-10-CM

## 2013-06-20 DIAGNOSIS — W19XXXA Unspecified fall, initial encounter: Secondary | ICD-10-CM

## 2013-06-20 DIAGNOSIS — M79609 Pain in unspecified limb: Secondary | ICD-10-CM

## 2013-06-20 DIAGNOSIS — C541 Malignant neoplasm of endometrium: Secondary | ICD-10-CM

## 2013-06-20 LAB — D-DIMER, QUANTITATIVE: D-Dimer, Quant: 1.34 ug/mL-FEU — ABNORMAL HIGH (ref 0.00–0.48)

## 2013-06-20 LAB — CBC WITH DIFFERENTIAL/PLATELET
BASOS ABS: 0 10*3/uL (ref 0.0–0.1)
Basophils Relative: 0 % (ref 0–1)
EOS PCT: 1 % (ref 0–5)
Eosinophils Absolute: 0.1 10*3/uL (ref 0.0–0.7)
HCT: 23.2 % — ABNORMAL LOW (ref 36.0–46.0)
Hemoglobin: 7.6 g/dL — ABNORMAL LOW (ref 12.0–15.0)
Lymphocytes Relative: 26 % (ref 12–46)
Lymphs Abs: 2.4 10*3/uL (ref 0.7–4.0)
MCH: 19.9 pg — AB (ref 26.0–34.0)
MCHC: 32.8 g/dL (ref 30.0–36.0)
MCV: 60.7 fL — AB (ref 78.0–100.0)
MONO ABS: 0.7 10*3/uL (ref 0.1–1.0)
Monocytes Relative: 8 % (ref 3–12)
Neutro Abs: 5.9 10*3/uL (ref 1.7–7.7)
Neutrophils Relative %: 65 % (ref 43–77)
PLATELETS: 249 10*3/uL (ref 150–400)
RBC: 3.82 MIL/uL — ABNORMAL LOW (ref 3.87–5.11)
RDW: 18.6 % — AB (ref 11.5–15.5)
WBC: 9.1 10*3/uL (ref 4.0–10.5)

## 2013-06-20 LAB — TROPONIN I
Troponin I: 2.9 ng/mL (ref <0.30)
Troponin I: 2.45 ng/mL (ref <0.30)

## 2013-06-20 LAB — BASIC METABOLIC PANEL
BUN: 8 mg/dL (ref 6–23)
CO2: 25 meq/L (ref 19–32)
CREATININE: 0.42 mg/dL — AB (ref 0.50–1.10)
Calcium: 8.9 mg/dL (ref 8.4–10.5)
Chloride: 97 mEq/L (ref 96–112)
GFR calc Af Amer: >90 mL/min (ref >90)
GFR calc non Af Amer: >90 mL/min (ref >90)
Glucose, Bld: 200 mg/dL — ABNORMAL HIGH (ref 70–99)
Potassium: 3.6 mEq/L — ABNORMAL LOW (ref 3.7–5.3)
Sodium: 136 mEq/L — ABNORMAL LOW (ref 137–147)

## 2013-06-20 LAB — GLUCOSE, CAPILLARY
Glucose-Capillary: 196 mg/dL — ABNORMAL HIGH (ref 70–99)
Glucose-Capillary: 301 mg/dL — ABNORMAL HIGH (ref 70–99)

## 2013-06-20 MED ORDER — TICAGRELOR 90 MG PO TABS: 90.0000 mg | ORAL_TABLET | Freq: Two times a day (BID) | ORAL | Status: DC

## 2013-06-20 MED ORDER — INSULIN GLARGINE 100 UNIT/ML ~~LOC~~ SOLN: 30.0000 [IU] | Freq: Every day | SUBCUTANEOUS | Status: DC

## 2013-06-20 MED ORDER — INSULIN ASPART 100 UNIT/ML ~~LOC~~ SOLN: 5.0000 [IU] | Freq: Three times a day (TID) | SUBCUTANEOUS | Status: DC

## 2013-06-20 MED ORDER — ATORVASTATIN CALCIUM 80 MG PO TABS: 80.0000 mg | ORAL_TABLET | Freq: Every day | ORAL | Status: DC

## 2013-06-20 MED ORDER — ASPIRIN 81 MG PO TBEC: 81.0000 mg | DELAYED_RELEASE_TABLET | Freq: Every day | ORAL | Status: AC

## 2013-06-20 MED ORDER — LORAZEPAM 2 MG/ML IJ SOLN: 1.0000 mg | INTRAMUSCULAR | Status: DC | PRN

## 2013-06-20 MED ORDER — NITROGLYCERIN 0.4 MG SL SUBL: 0.4000 mg | SUBLINGUAL_TABLET | SUBLINGUAL | Status: AC | PRN

## 2013-06-20 MED ORDER — "BD GETTING STARTED TAKE HOME KIT: 1ML X 30 G SYRINGES, "
1.0000 | Freq: Once | Status: AC
  Administered 2013-06-20: 1
  Filled 2013-06-20: qty 1

## 2013-06-20 MED ORDER — LORAZEPAM 1 MG PO TABS
1.0000 mg | ORAL_TABLET | Freq: Two times a day (BID) | ORAL | Status: DC | PRN
Start: 2013-06-20 — End: 2013-06-20
  Administered 2013-06-20: 1 mg via ORAL
  Filled 2013-06-20: qty 1

## 2013-06-20 MED ORDER — METOPROLOL TARTRATE 25 MG PO TABS: 25.0000 mg | ORAL_TABLET | Freq: Two times a day (BID) | ORAL | Status: DC

## 2013-06-20 NOTE — Progress Notes (Signed)
06/20/2013, 11:35 AM  Hospital day 6 Antibiotics: none Chemotherapy: day 10 cycle 1 carbo taxol (no gCSF)  This MD arrived on unit as patient leaving AMA, mother Jeneen Montgomery, friend Theresia Bough and gentleman friend here. She returned to room for [place] to discuss situation, and has subsequently agreed to stay in order to be sure all DC meds are correct, that she understands instructions for meds, that she has patient assistance form for Brilinta, and that she has glucometer. I believe she also now agrees to wait for second troponin level, to be drawn ~ 6 hrs from 1000. She agrees to rescind the leaving AMA form, but hopes she can be DC today.  I have spoken with unit RN at bedside, Dr Coralyn Pear by phone and unit director at bedside. I have spoken directly with Nathalie and set up appointment to see me with lab Mon 4-13 1200 lab and 10 MD. I have given this appointment to patient and mother written now, and it is now also in EMR   Subjective: Pain from [place] during night, when line capped, and with blood draw/ flush this AM. Difficult interactions reported by patient as she requested pain meds and that PICC be DCd, and with that procedure. Patient then extremely upset, felt more SOB and had more pain in RUE, was leaving AMA when fell, caught by Dr Coralyn Pear. No apparent seizure activity. Evaluation begun after she fell, including EKG and review by cardiology, however patient still upset and was leaving [place] as I came to unit. After discussion she is more calm, drank Sprite, denies SOB seated in room. She reports soreness at site of dressing for previous PICC, but the right upper arm and right shoulder pain is improved. She denies SOB seated in room on RA now. She reports vomiting breakfast this AM when upset.  Bowels now moving well. Able to ambulate in hall without assistance last pm. Checked own blood sugar and is willing to do this at home [place]. Very difficult peripheral IV access, staff unable to  draw on several attempts.   Objective: Vital signs in last 24 hours: Blood pressure 197/157, pulse 143, temperature 99.7 F (37.6 C), temperature source Oral, resp. rate 25, height 5\' 6"  (1.676 m), weight 246 lb 4.1 oz (111.7 kg), last menstrual period 02/24/2013, SpO2 100.00%.   Intake/Output from previous day: 04/09 0701 - 04/10 0700 In: 480 [P.O.:480] Out: 452 [Urine:450; Stool:2] Intake/Output this shift:    Physical exam: Walked slowly back to room, then in chair. Noticeably more calm after discussion with this MD and with RN unit director. Respirations not labored RA. Mouth clear and moist. PERRL. Lungs clear to auscultation. Heart RRR. PICC dressing dry, fabric adhesive in place, no obvious skin irritation around that adhesive now. No bruising or swelling obvious RUE otherwise, able to move arm. Abdomen soft, some BS, not tender. LE no edema, cords, tenderness. Neuro: moves all extremities, speech fluent, no seizure activity apparent.   Lab Results:  Recent Labs  06/19/13 0620 06/20/13 0520  WBC 10.2 9.1  HGB 8.1* 7.6*  HCT 24.8* 23.2*  PLT PLATELET CLUMPS NOTED ON SMEAR, COUNT APPEARS ADEQUATE 249  this CBC apparently drawn from PICC early AM, not clear if Hgb is correct. BMET  Recent Labs  06/19/13 0620 06/20/13 0520  NA 136* 136*  K 4.2 3.6*  CL 97 97  CO2 25 25  GLUCOSE 306* 200*  BUN 10 8  CREATININE 0.46* 0.42*  CALCIUM 8.9 8.9   Blood  sugar 0554 was 196  Studies/Results: Dg Chest Port 1 View  06/20/2013   CLINICAL DATA:  Chest pain.  Attempted right arm PICC placement.  EXAM: PORTABLE CHEST - 1 VIEW  COMPARISON:  06/18/2013  FINDINGS: Heart size is normal. Mediastinal shadows are normal. Lungs are clear. No effusions. No bony abnormalities. Poor inspiration.  IMPRESSION: Poor inspiration.  No active disease.   Electronically Signed   By: Nelson Chimes M.D.   On: 06/20/2013 09:24   Dg Chest Port 1 View  06/18/2013   CLINICAL DATA:  Status post PICC line  placement  EXAM: PORTABLE CHEST - 1 VIEW  COMPARISON:  None.  FINDINGS: The patient has undergone right-sided PICC line placement. The tip of the catheter lies in the region of the junction of the SVC with the right atrium. The lungs are hypoinflated. The cardiac silhouette is mildly enlarged. The perihilar interstitial markings are prominent.  IMPRESSION: There is no evidence of immediate postprocedure complication following placement of a PICC line via the right upper extremity.  These results were called by telephone at the time of interpretation on 06/18/2013 at 1:06 PM to Terese Door, RN, who verbally acknowledged these results.   Electronically Signed   By: David  Martinique   On: 06/18/2013 13:07   Dg Abd Portable 2v  06/18/2013   CLINICAL DATA:  Abdominal tenderness  EXAM: PORTABLE ABDOMEN - 2 VIEW  COMPARISON:  CT abdomen and pelvis March 27, 2013  FINDINGS: Supine and left lateral decubitus abdomen images were obtained. The bowel gas pattern is unremarkable. No obstruction or free air is appreciable. There are a few small presumed phleboliths in the pelvis.  IMPRESSION: The bowel gas pattern is unremarkable.   Electronically Signed   By: Lowella Grip M.D.   On: 06/18/2013 17:56     Assessment/Plan: 1. Acute inferior STEMI 06-17-13: cath with RCA thrombectomy and stent. Pain complaints and elevated BP with agitation this AM, not obviously cardiac per Dr Gwenlyn Found then, hopefully will allow a second troponin result to be obtained. 2. Endometrial carcinoma: presentation and diagnosis Jan 2015, post robotic assisted laparoscopic TAH BSO and bilateral pelvic and periaortic node evaluation by Dr Nancy Marus at [facility] 05-16-2013 IIIA grade 2. First adjuvant taxol carboplatin at [facility] 06-11-13. Blood counts still good other than hemoglobin, may not yet be at nadir but good. Has not had gCSF. Will follow counts daily while in hospital.  2.Diabetes: poorly controlled PTA, now on insulin, still  needs teaching and needs home glucometer at DC.  3. Antibiotics DCd, having been started on admission for questionable findings on chest CT. No clinical pulmonary infection  4.Constipation: with recent gyn surgery, chemo, pain meds. Resolved. Continue laxatives  5.Seizure disorder since age 67: may have had post op seizure at [facility], seen then by [facility] neurology and begun on trileptal. Per [facility] notes, seizure was described as "shaking and screaming" 6. Iron deficiency anemia related to heavy gyn blood loss, now on ferrous gluconate for less GI side effects. Hemoglobin lower today, ? If accurate given PICC problems, post chemo and cath etc. If possible to recheck while still in hospital that would be best, but if not possible I will repeat at [facility] on 4-13. Continue po iron.  7.Obesity: intentional weight loss of 100 lbs since 2012, mostly with exercise.  8. Peripheral IV access difficult. PICC out. She will need PAC before further chemo, which we can get out patient. 9.Social concerns: recently widowed, I believe  shortly after adopting now 76 month old daughter. Mother and sister assisting with the infant. 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 Financial difficulty obtaining medicines. Last care management note 06-18-13, but would be helpful if they could follow up today and I have LM for case manager now. 10.GERD, on protonix  11.Sickle trait: found on recent hemoglobin electrophoresis. I do not know if this has been known prior.  Time spent 60 min including >50% counseling and coordination of care.  Gordy Levan, MD [phone] Pager [phone]

## 2013-06-20 NOTE — Progress Notes (Signed)
Informed patient there would be a short wait before transport will be down to vascular lab to take her back to room. She was very upset by this and wanted to just walk back to her room. I told her that would be unsafe and for her to wait while I called her nurse on the floor. She agreed to wait until I returned.  I called the nurse who said she could send someone down now to pick up patient. Went back in to tell patient of this and she was gone. I looked around dept and could not find her. Called nurse again to inform her. I personally walked up to patient's room looking for her, and when I got to [id number] I found she had made it back.   Landry Mellow, RDMS, RVT 06/20/2013 3:30pm

## 2013-06-20 NOTE — Progress Notes (Signed)
Called to start Pt's PIV.  On entering room Pt. Shouted at Probation officer to leave room and "to get away".  Appears agitated and upset refusing to talk to Probation officer.  Refusing PIV.

## 2013-06-20 NOTE — Progress Notes (Signed)
*  PRELIMINARY RESULTS* Vascular Ultrasound Right upper extremity venous duplex has been completed.  Preliminary findings: No evidence of DVT. Superficial thrombosis noted in the right cephalic vein, and it appears to be more chronic.   Chapman Fitch RVT 06/20/2013, 3:26 PM

## 2013-06-20 NOTE — Telephone Encounter (Signed)
,,

## 2013-06-20 NOTE — Progress Notes (Signed)
Called at [phone] by patient's primary nurse to come check PICC line because pt. Told her it did not hurt until it was flushed. I talked with patient she states it did not hurt until it was flushed and she wants it taken out. States it hurts in chest pointing to right chest area. Pt. Would not let me flush port or check for blood return. PICC site WNL and no edema noted. Will have PICC nurse follow up.  Wynelle Cleveland RN VAST

## 2013-06-20 NOTE — Progress Notes (Signed)
Subjective:  Pt was going to be D/Cd today and developed LUE pain when PICC line was removed  Objective:  Temp:  [98.1 F (36.7 C)-99.7 F (37.6 C)] 99.7 F (37.6 C) (04/10 0426) Pulse Rate:  [97-143] 143 (04/10 0953) Resp:  [16-25] 25 (04/10 0953) BP: (95-197)/(62-157) 197/157 mmHg (04/10 0953) SpO2:  [98 %-100 %] 100 % (04/10 0953) Weight change:   Intake/Output from previous day: 04/09 0701 - 04/10 0700 In: 480 [P.O.:480] Out: 452 [Urine:450; Stool:2]  Intake/Output from this shift:    Physical Exam: General appearance: alert, combative and severe distress Neck: no adenopathy, no carotid bruit, no JVD, supple, symmetrical, trachea midline and thyroid not enlarged, symmetric, no tenderness/mass/nodules Lungs: clear to auscultation bilaterally Heart: regular rate and rhythm, S1, S2 normal, no murmur, click, rub or gallop Extremities: extremities normal, atraumatic, no cyanosis or edema  Lab Results: Results for orders placed during the hospital encounter of 06/15/13 (from the past 48 hour(s))  GLUCOSE, CAPILLARY     Status: Abnormal   Collection Time    06/18/13 12:58 PM      Result Value Ref Range   Glucose-Capillary 243 (*) 70 - 99 mg/dL  GLUCOSE, CAPILLARY     Status: Abnormal   Collection Time    06/18/13  5:04 PM      Result Value Ref Range   Glucose-Capillary 204 (*) 70 - 99 mg/dL  GLUCOSE, CAPILLARY     Status: Abnormal   Collection Time    06/18/13  8:52 PM      Result Value Ref Range   Glucose-Capillary 235 (*) 70 - 99 mg/dL   Comment 1 Documented in Chart     Comment 2 Notify RN    GLUCOSE, CAPILLARY     Status: Abnormal   Collection Time    06/19/13 12:27 AM      Result Value Ref Range   Glucose-Capillary 246 (*) 70 - 99 mg/dL   Comment 1 Notify RN     Comment 2 Documented in Chart    CBC WITH DIFFERENTIAL     Status: Abnormal   Collection Time    06/19/13  6:20 AM      Result Value Ref Range   WBC 10.2  4.0 - 10.5 K/uL   Comment: WHITE  COUNT CONFIRMED ON SMEAR   RBC 4.10  3.87 - 5.11 MIL/uL   Hemoglobin 8.1 (*) 12.0 - 15.0 g/dL   HCT 24.8 (*) 36.0 - 46.0 %   MCV 60.5 (*) 78.0 - 100.0 fL   MCH 19.8 (*) 26.0 - 34.0 pg   MCHC 32.7  30.0 - 36.0 g/dL   RDW 18.3 (*) 11.5 - 15.5 %   Platelets    150 - 400 K/uL   Value: PLATELET CLUMPS NOTED ON SMEAR, COUNT APPEARS ADEQUATE   Neutrophils Relative % 70  43 - 77 %   Lymphocytes Relative 23  12 - 46 %   Monocytes Relative 6  3 - 12 %   Eosinophils Relative 1  0 - 5 %   Basophils Relative 0  0 - 1 %   RBC Morphology POLYCHROMASIA PRESENT     Smear Review LARGE PLATELETS PRESENT     Neutro Abs 7.2  1.7 - 7.7 K/uL   Lymphs Abs 2.3  0.7 - 4.0 K/uL   Monocytes Absolute 0.6  0.1 - 1.0 K/uL   Eosinophils Absolute 0.1  0.0 - 0.7 K/uL   Basophils Absolute 0.0  0.0 -  0.1 K/uL  BASIC METABOLIC PANEL     Status: Abnormal   Collection Time    06/19/13  6:20 AM      Result Value Ref Range   Sodium 136 (*) 137 - 147 mEq/L   Potassium 4.2  3.7 - 5.3 mEq/L   Chloride 97  96 - 112 mEq/L   CO2 25  19 - 32 mEq/L   Glucose, Bld 306 (*) 70 - 99 mg/dL   BUN 10  6 - 23 mg/dL   Creatinine, Ser 0.46 (*) 0.50 - 1.10 mg/dL   Calcium 8.9  8.4 - 10.5 mg/dL   GFR calc non Af Amer >90  >90 mL/min   GFR calc Af Amer >90  >90 mL/min   Comment: (NOTE)     The eGFR has been calculated using the CKD EPI equation.     This calculation has not been validated in all clinical situations.     eGFR's persistently <90 mL/min signify possible Chronic Kidney     Disease.  GLUCOSE, CAPILLARY     Status: Abnormal   Collection Time    06/19/13  6:23 AM      Result Value Ref Range   Glucose-Capillary 287 (*) 70 - 99 mg/dL  GLUCOSE, CAPILLARY     Status: Abnormal   Collection Time    06/19/13 11:21 AM      Result Value Ref Range   Glucose-Capillary 312 (*) 70 - 99 mg/dL  GLUCOSE, CAPILLARY     Status: Abnormal   Collection Time    06/19/13  4:02 PM      Result Value Ref Range   Glucose-Capillary 307 (*)  70 - 99 mg/dL  GLUCOSE, CAPILLARY     Status: Abnormal   Collection Time    06/19/13  8:48 PM      Result Value Ref Range   Glucose-Capillary 190 (*) 70 - 99 mg/dL  CBC WITH DIFFERENTIAL     Status: Abnormal   Collection Time    06/20/13  5:20 AM      Result Value Ref Range   WBC 9.1  4.0 - 10.5 K/uL   RBC 3.82 (*) 3.87 - 5.11 MIL/uL   Hemoglobin 7.6 (*) 12.0 - 15.0 g/dL   HCT 23.2 (*) 36.0 - 46.0 %   MCV 60.7 (*) 78.0 - 100.0 fL   MCH 19.9 (*) 26.0 - 34.0 pg   MCHC 32.8  30.0 - 36.0 g/dL   RDW 18.6 (*) 11.5 - 15.5 %   Platelets 249  150 - 400 K/uL   Neutrophils Relative % 65  43 - 77 %   Lymphocytes Relative 26  12 - 46 %   Monocytes Relative 8  3 - 12 %   Eosinophils Relative 1  0 - 5 %   Basophils Relative 0  0 - 1 %   Neutro Abs 5.9  1.7 - 7.7 K/uL   Lymphs Abs 2.4  0.7 - 4.0 K/uL   Monocytes Absolute 0.7  0.1 - 1.0 K/uL   Eosinophils Absolute 0.1  0.0 - 0.7 K/uL   Basophils Absolute 0.0  0.0 - 0.1 K/uL   RBC Morphology POLYCHROMASIA PRESENT    BASIC METABOLIC PANEL     Status: Abnormal   Collection Time    06/20/13  5:20 AM      Result Value Ref Range   Sodium 136 (*) 137 - 147 mEq/L   Potassium 3.6 (*) 3.7 - 5.3 mEq/L   Chloride 97  96 - 112 mEq/L   CO2 25  19 - 32 mEq/L   Glucose, Bld 200 (*) 70 - 99 mg/dL   BUN 8  6 - 23 mg/dL   Creatinine, Ser 0.42 (*) 0.50 - 1.10 mg/dL   Calcium 8.9  8.4 - 10.5 mg/dL   GFR calc non Af Amer >90  >90 mL/min   GFR calc Af Amer >90  >90 mL/min   Comment: (NOTE)     The eGFR has been calculated using the CKD EPI equation.     This calculation has not been validated in all clinical situations.     eGFR's persistently <90 mL/min signify possible Chronic Kidney     Disease.  GLUCOSE, CAPILLARY     Status: Abnormal   Collection Time    06/20/13  5:54 AM      Result Value Ref Range   Glucose-Capillary 196 (*) 70 - 99 mg/dL    Imaging: Imaging results have been reviewed  Assessment/Plan:   1. Principal Problem: 2.   Chest  pain 3. Active Problems: 4.   SEIZURE DISORDER 5.   HCAP (healthcare-associated pneumonia) 6.   Shortness of breath 7.   Diabetes mellitus type 2, uncontrolled 8.   STEMI (ST elevation myocardial infarction) 9.   Time Spent Directly with Patient:  20 minutes  Length of Stay:  LOS: 5 days   Pt developed LUE pain when PICC was removed as well as CP. EKG showed ST w/o STTTWC. She was hysterical out of proportion to what was done and threatened to leave AMA. Dr. Coralyn Pear caught her as she fell to ground. Doubt cardiac. I question medical compliance with cardiac meds including DAPT with recent DES in setting of inferior STEMI. From our point of view she can be discharged but there are clearly other issues to be addressed. Will be available as needed.   Lorretta Harp 06/20/2013, 10:35 AM

## 2013-06-20 NOTE — Discharge Summary (Signed)
Physician Discharge Summary  Kelly [name] [id number] DOB: 1975-12-24 DOA: 06/15/2013  PCP: [name], PA-C  Admit date: 06/15/2013 Discharge date: 06/20/2013  Time spent: 35 minutes  Recommendations for Outpatient Follow-up:  1. Please followup on blood sugars, she was discharged on Lantus with NovoLog for meal time coverage. She was instructed to check blood sugars 3 times daily prior to meals and present these to her primary care physician   Discharge Diagnoses:  Principal Problem:   STEMI (ST elevation myocardial infarction) Active Problems:   Chest pain   Diabetes mellitus type 2, uncontrolled   Shortness of breath   SEIZURE DISORDER   Uterine mass   Discharge Condition: Stable  Diet recommendation: Heart healthy/consistent diet  Filed Weights   06/17/13 0130 06/18/13 0412 06/20/13 1200  Weight: 111.6 kg (246 lb 0.5 oz) 111.7 kg (246 lb 4.1 oz) 112.175 kg (247 lb 4.8 oz)    History of present illness:  Kelly [name] is a 38 y.o. female with recently diagnosed endometrial carcinoma status post surgery and had received chemotherapy last Thursday 4 days ago started developing chest pain and shortness of breath or palpitations last evening. Patient also has been having lower extremity numbness with weakness since chemotherapy and also developed left upper extremity numbness since last night. In the ER patient was found to be very tachycardic and had CT angiogram of the chest which was negative for pulmonary embolism and had shown features concerning for possible pneumonia, inflammatory process versus drug toxicity versus metastatic process. Patient denies having any productive cough fever chills. Has some nausea. Denies any abdominal pain diarrhea. Since surgery patient has been having some vaginal discharge which patient states her surgeon has stated was normal. Patient has been empirically started on antibiotics for possible pneumonia and admitted  for further management. On exam patient is able to move all extremities. Denies any incontinence of urine or bowel.   Hospital Course:  Patient is a pleasant 38 year old female with a past medical history of type 2 diabetes mellitus, hypertension, who was admitted to the medicine service on 06/16/2013. She initially presented with complaints of chest pain and shortness of breath, which was initially worked up with a CTA in the emergency room. This was negative for pulmonary embolism however showed changes that could be consistent with pneumonia versus inflammatory process versus drug toxicity versus metastatic process. She was initially started on empiric IV antimicrobial therapy. Upon reaching the medical floor she was found to have an elevated troponin going from 0.88 to 4.04. CODE STATUS was called as cardiology was immediately consulted. 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 This is also evidence of distal thrombus and distal PDA. Patient undergoing percutaneous intervention with a drug-eluting stent. She tolerated procedure well there are no immediate complications. She was started on aspirin and Brilinta. On the morning of 06/20/2013 patient becoming upset and signed AMA paperwork. I was concerned about her complaints of chest discomfort along with leaving without her medications including Brilinta. Will walking down the hallway the patient having a fall. She did not have loss of consciousness, no visible injuries, as I carried her on her way down. Patient did have an EKG done which did not reveal acute ischemic changes. This was reviewed with cardiology. I did cycled 2 sets of troponins, coming back at 2.9 and 2.45 (second draw) overall trending down  from 15.99 06/17/2013. Patient appear to be quite upset as I suspect chest discomfort may have  originated from anxiety attack. Cardiology did not have further recommendations and felt she was stable for discharge. With regard to history of endometrial carcinoma she was seen by Dr. [name] of medical oncology during this hospitalization. Patient will be followed up on Monday 06/23/2013. Prior to discharge social work and Tourist information centre manager consulted. She was given a Brilinta coupon as well as diabetic supplies. Patient was discharged in stable condition on 06/20/2013.  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 Cardiovascular: Regular rate rhythm normal S1-S2  Respiratory: Clear to auscultation bilaterally  Abdomen: Soft nontender nondistended  Musculoskeletal: No edema    Discharge Instructions You were cared for by a hospitalist during your hospital stay. If you have any questions about your discharge medications or the care you received while you were in the hospital after you are discharged, you can call the unit and asked to speak with the hospitalist on call if the hospitalist that took care of you is not available. Once you are discharged, your primary care physician will handle any further medical issues. Please note that NO REFILLS for any discharge medications will be authorized once you are discharged, as it is imperative that you return to your primary care physician (or establish a relationship with a primary care physician if you do not have one) for your aftercare needs so that they can reassess your need for medications and monitor your lab values.      Discharge Orders   Future Appointments Provider Department Dept Phone   06/23/2013 12:30 PM [facility] [phone]   06/23/2013 1:00 PM Gordy Levan, MD [facility] [phone]   06/25/2013 9:00 AM [facility] A. [name], [facility] [phone]   06/25/2013 9:30 AM [facility] [phone]   06/26/2013 3:00 PM [facility] [phone]   06/26/2013 3:30 PM Gordy Levan, MD [facility] [phone]   Future Orders Complete By Expires   Amb Referral to Cardiac Rehabilitation  As directed    Call MD for:  difficulty breathing, headache or visual disturbances  As directed    Call MD for:  extreme fatigue  As directed    Call MD for:  hives  As directed    Call MD for:  persistant dizziness or light-headedness  As directed    Call MD for:  persistant nausea and vomiting  As directed    Call MD for:  redness, tenderness, or signs of infection (pain, swelling, redness, odor or green/yellow discharge around incision site)  As directed    Diet - low sodium heart healthy  As directed    Increase activity slowly  As directed        Medication List    STOP taking these medications       ALEVE 220 MG tablet  Generic drug:  naproxen sodium     glipiZIDE 10 MG tablet  Commonly known as:  GLUCOTROL     meloxicam 7.5 MG tablet  Commonly known as:  MOBIC     metFORMIN 1000 MG tablet  Commonly known as:  GLUCOPHAGE     ondansetron 8 MG tablet  Commonly known as:  ZOFRAN      TAKE these medications       aspirin 81 MG EC tablet  Take 1 tablet (81 mg total) by mouth daily.     atorvastatin 80 MG tablet  Commonly known as:  LIPITOR  Take 1 tablet (80 mg total) by mouth daily at 6 PM.     dexamethasone 4 MG tablet  Commonly known as:  DECADRON  Take 20 mg by mouth as directed. Take 5 tab with food 12 hr and 6 hr prior to Chemotherapy.Taxol     ferrous sulfate  325 (65 FE) MG tablet  Take 325 mg by mouth.     insulin aspart 100 UNIT/ML injection  Commonly known as:  novoLOG  Inject 5 Units into the skin 3 (three) times daily with meals.     insulin glargine 100 UNIT/ML injection  Commonly known as:  LANTUS  Inject 0.3 mLs (30 Units total) into the skin at bedtime.     LORazepam 1 MG tablet  Commonly known as:  ATIVAN  Place 1/2- 1 tab under tongue or swallow every 6 hr as needed for nausea.  Take 1 tab the night of chemo.  Will make drowsy     metoprolol tartrate 25 MG tablet  Commonly known as:  LOPRESSOR  Take 1 tablet (25 mg total) by mouth 2 (two) times daily.     multivitamin with minerals tablet  Take 1 tablet by mouth daily.     nitroGLYCERIN 0.4 MG SL tablet  Commonly known as:  NITROSTAT  Place 1 tablet (0.4 mg total) under the tongue every 5 (five) minutes as needed for chest pain.     OXcarbazepine 150 MG tablet  Commonly known as:  TRILEPTAL  Take 150 mg by mouth daily.     oxyCODONE-acetaminophen 5-325 MG per tablet  Commonly known as:  PERCOCET/ROXICET  Take 1 tablet by mouth every 6 (six) hours as needed.     pantoprazole 40 MG tablet  Commonly known as:  PROTONIX  Take 1 tablet (40 mg total) by mouth daily.     polyethylene glycol packet  Commonly known as:  MIRALAX / GLYCOLAX  Take 17 g by mouth daily.     STOOL SOFTENER 100 MG capsule  Generic drug:  docusate sodium  Take 100-200 mg by mouth 2 (two) times daily.     Ticagrelor 90 MG Tabs tablet  Commonly known as:  BRILINTA  Take 1 tablet (90 mg total) by mouth 2 (two) times daily.       Allergies  Allergen Reactions  . Amoxicillin Nausea And Vomiting, Swelling and Rash    Rash, vomiting, throat closing Rash, vomiting, throat closing  . Flagyl [Metronidazole] Nausea And Vomiting    04/30/13-Per pt she is unable to tolerate this medication, causes rash 04/30/13-Per pt she is unable to tolerate this medication, causes rash  . Adhesive [Tape] Rash and  Dermatitis    "takes skin off" "takes skin off"   Follow-up Information   Follow up with Glade Lloyd, DAVID SHANE, PA-C In 1 week.   Specialty:  Family Medicine   Contact information:   [place] [address] [phone]       Follow up with Lorretta Harp, MD In 2 weeks.   Specialty:  Cardiology   Contact  information:   [address] [place]  [address] [phone]        The results of significant diagnostics from this hospitalization (including imaging, microbiology, ancillary and laboratory) are listed below for reference.    Significant Diagnostic Studies: Ct Head Wo Contrast  06/16/2013   CLINICAL DATA:  Tingling and numbness left upper extremity in both lower extremities. History of seizures.  EXAM: CT HEAD WITHOUT CONTRAST  TECHNIQUE: Contiguous axial images were obtained from the base of the skull through the vertex without intravenous contrast.  COMPARISON:  09/14/2008  FINDINGS: Ventricles, cisterns and other CSF spaces are within normal. There is no mass, mass effect, shift of midline structures or acute hemorrhage. No evidence of acute infarction. Remaining bones and soft tissues are within normal.  IMPRESSION: No acute intracranial findings.   Electronically Signed   By: Marin Olp M.D.   On: 06/16/2013 01:34   Ct Angio Chest Pe W/cm &/or Wo Cm  06/15/2013   CLINICAL DATA:  Chest pain and shortness of breath. History of stage III endometrial cancer. Currently on chemotherapy.  EXAM: CT ANGIOGRAPHY CHEST WITH CONTRAST  TECHNIQUE: Multidetector CT imaging of the chest was performed using the standard protocol during bolus administration of intravenous contrast. Multiplanar CT image reconstructions and MIPs were obtained to evaluate the vascular anatomy.  CONTRAST:  157mL OMNIPAQUE IOHEXOL 350 MG/ML SOLN  COMPARISON:  CT abdomen/pelvis 03/27/2013  FINDINGS: Lungs are adequately inflated with minimal nodular opacification over the medial  left lower lobe. Heart size of normal. There is no evidence of pulmonary embolism. Remaining mediastinal structures are within normal. There is no evidence of hilar, mediastinal or axillary adenopathy.  Images through the upper abdomen are notable only for mild diverticulosis of the colon. Remaining bones a soft tissues are within normal.  Review of the MIP images confirms the above findings.  IMPRESSION: No evidence of pulmonary embolism.  Minimal nonspecific nodular opacification over the medial left lower lobe which may be due to infection, inflammatory process/ drug toxicity or metastatic disease. Consider followup CT in 4-6 weeks.   Electronically Signed   By: Marin Olp M.D.   On: 06/15/2013 23:46   Dg Chest Port 1 View  06/20/2013   CLINICAL DATA:  Chest pain.  Attempted right arm PICC placement.  EXAM: PORTABLE CHEST - 1 VIEW  COMPARISON:  06/18/2013  FINDINGS: Heart size is normal. Mediastinal shadows are normal. Lungs are clear. No effusions. No bony abnormalities. Poor inspiration.  IMPRESSION: Poor inspiration.  No active disease.   Electronically Signed   By: Nelson Chimes M.D.   On: 06/20/2013 09:24   Dg Chest Port 1 View  06/18/2013   CLINICAL DATA:  Status post PICC line placement  EXAM: PORTABLE CHEST - 1 VIEW  COMPARISON:  None.  FINDINGS: The patient has undergone right-sided PICC line placement. The tip of the catheter lies in the region of the junction of the SVC with the right atrium. The lungs are hypoinflated. The cardiac silhouette is mildly enlarged. The perihilar interstitial markings are prominent.  IMPRESSION: There is no evidence of immediate postprocedure complication following placement of a PICC line via the right upper extremity.  These results were called by telephone at the time of interpretation on 06/18/2013 at 1:06 PM to Terese Door, RN, who verbally acknowledged these results.   Electronically Signed   By: David  Martinique   On: 06/18/2013 13:07   Dg Abd Portable  2v  06/18/2013   CLINICAL DATA:  Abdominal tenderness  EXAM: PORTABLE ABDOMEN - 2 VIEW  COMPARISON:  CT abdomen and pelvis March 27, 2013  FINDINGS: Supine and left lateral decubitus abdomen images were obtained. The bowel gas pattern is unremarkable. No obstruction or free air is appreciable. There are a few small presumed phleboliths in the pelvis.  IMPRESSION: The bowel gas pattern is unremarkable.   Electronically Signed   By: Lowella Grip M.D.   On: 06/18/2013 17:56    Microbiology: Recent Results (from the past 240 hour(s))  CULTURE, BLOOD (ROUTINE X 2)     Status: None   Collection Time    06/15/13 11:03 PM      Result Value Ref Range Status   Specimen Description BLOOD LEFT ANTECUBITAL   Final   Special Requests BOTTLES DRAWN AEROBIC AND ANAEROBIC 4CC   Final   Culture  Setup Time     Final   Value: 06/16/2013 08:40     Performed at [facility]   Culture     Final   Value:        BLOOD CULTURE RECEIVED NO GROWTH TO DATE CULTURE WILL BE HELD FOR 5 DAYS BEFORE ISSUING A FINAL NEGATIVE REPORT     Performed at [facility]   Report Status PENDING   Incomplete  CULTURE, BLOOD (ROUTINE X 2)     Status: None   Collection Time    06/15/13 11:15 PM      Result Value Ref Range Status   Specimen Description BLOOD LEFT HAND   Final   Special Requests BOTTLES DRAWN AEROBIC AND ANAEROBIC 5CC   Final   Culture  Setup Time     Final   Value: 06/16/2013 08:40     Performed at [facility]   Culture     Final   Value:        BLOOD CULTURE RECEIVED NO GROWTH TO DATE CULTURE WILL BE HELD FOR 5 DAYS BEFORE ISSUING A FINAL NEGATIVE REPORT     Performed at [facility]   Report Status PENDING   Incomplete  MRSA PCR SCREENING     Status: None   Collection Time    06/16/13  2:04 AM      Result Value Ref Range Status   MRSA by PCR NEGATIVE  NEGATIVE Final   Comment:            The GeneXpert MRSA Assay (FDA     approved for NASAL specimens     only), is one  component of a     comprehensive MRSA colonization     surveillance program. It is not     intended to diagnose MRSA     infection nor to guide or     monitor treatment for     MRSA infections.  MRSA PCR SCREENING     Status: None   Collection Time    06/17/13  2:32 AM      Result Value Ref Range Status   MRSA by PCR NEGATIVE  NEGATIVE Final   Comment:            The GeneXpert MRSA Assay (FDA     approved for NASAL specimens     only), is one component of a     comprehensive MRSA colonization     surveillance program. It is not     intended to diagnose MRSA     infection nor to guide or     monitor treatment for  MRSA infections.     Labs: Basic Metabolic Panel:  Recent Labs Lab 06/15/13 2238 06/15/13 2247 06/16/13 0330 06/17/13 0418 06/19/13 0620 06/20/13 0520  NA 130* 134* 131* 136* 136* 136*  K 4.0 4.0 4.3 4.3 4.2 3.6*  CL 91* 97 96 97 97 97  CO2 21  --  24 23 25 25   GLUCOSE 439* 442* 341* 293* 306* 200*  BUN 9 7 8 6 10 8   CREATININE 0.45* 0.50 0.42* 0.35* 0.46* 0.42*  CALCIUM 10.2  --  8.7 8.5 8.9 8.9   Liver Function Tests:  Recent Labs Lab 06/15/13 2238 06/16/13 0330  AST 7 7  ALT 12 10  ALKPHOS 141* 126*  BILITOT 0.7 0.8  PROT 8.3 7.2  ALBUMIN 3.7 3.4*   No results found for this basename: LIPASE, AMYLASE,  in the last 168 hours No results found for this basename: AMMONIA,  in the last 168 hours CBC:  Recent Labs Lab 06/15/13 2238  06/16/13 0330 06/17/13 0418 06/18/13 0326 06/19/13 0620 06/20/13 0520  WBC 6.7  --  6.3 9.9 11.0* 10.2 9.1  NEUTROABS 4.2  --  4.6  --  8.0* 7.2 5.9  HGB 10.3*  < > 9.3* 9.4* 8.5* 8.1* 7.6*  HCT 31.5*  < > 29.3* 28.3* 25.7* 24.8* 23.2*  MCV 59.2*  --  60.7* 60.0* 60.5* 60.5* 60.7*  PLT 373  --  262 314 283 PLATELET CLUMPS NOTED ON SMEAR, COUNT APPEARS ADEQUATE 249  < > = values in this interval not displayed. Cardiac Enzymes:  Recent Labs Lab 06/16/13 1501 06/16/13 2155 06/17/13 2004 06/20/13 1000  06/20/13 1432  TROPONINI 0.88* 4.04* 15.99* 2.90* 2.45*   BNP: BNP (last 3 results)  Recent Labs  06/16/13 0810  PROBNP 5.6   CBG:  Recent Labs Lab 06/19/13 1121 06/19/13 1602 06/19/13 2048 06/20/13 0554 06/20/13 1613  GLUCAP 312* 307* 190* 196* 301*       Signed:  Kelvin Cellar  [facility] 06/20/2013, 4:43 PM

## 2013-06-20 NOTE — Progress Notes (Addendum)
Patient complained of rt arm pain after IV team flushed it this morning. I called IV team to assess and called Dr. Coralyn Pear for order to DC PICC line because patient wanted it out. IV team [place] IV per order and patient started complaining of increased pain. When I came in the room to give her pain medication she refuses and asked to leave AMA.I tried to calm patient down. I called Dr. Coralyn Pear and he ordered an EKG, chest x-ray, and troponin enzymes to be drawn. Neoma Laming did the EKG and lab came, called Dr. Gwenlyn Found and he came right over to assess the patient as well. He calmed her down enough to take Percocet and Ativan as ordered. Dr. Gwenlyn Found wanted her to have IV access so I called IV team since she is a hard stick. When the IV nurse walked in the room she started screaming and crying about how the IV nurse had hurt her arm when he discontinued the PICC line. Patient wanted to leave ASAP and started getting dressed and taking her leads off. Paged Dr. Coralyn Pear to make him aware of patient signing out [place]. Patient signed the form after talking to her mother. He mother stated that she and her sister were on the way. Patient agreed to wait for them because she was worried that she might have a seizure. Patient started walking down the hallway before her family got here and Dr. Coralyn Pear and the cardiologist PA were both trying to talk to her in the hallway when she collapsed on Dr. Coralyn Pear. Patient's HR, RR, and BP was elevated on assessment. Patient helped to a wheelchair and taken back to her room. Patient agreed to let me attempt to start an IV because Dr. Coralyn Pear wanted to give her IV pain medication as needed. I attempted 3 times with no success. Patient started to calm down when her family got here and her oncologist talked to her regarding her Diabetes Kit, prescriptions, and Brilinta card. Dr. Coralyn Pear also ordered a doppler of the rt arm to check for DVT prior to discharge. After tests completed and education completed she  will be discharged today.

## 2013-06-20 NOTE — Progress Notes (Signed)
OT Cancellation Note  Patient Details Name: Courtnei Ruddell MRN: [id number] DOB: [birth year]   Cancelled Treatment:    Reason Eval/Treat Not Completed: Pain limiting ability to participate.  Pt. States pain from PICC line flush causing severe chest pain preventing her ability to participate in skilled OT.  RN notified and contacted IV team to assist with helping pt.  Will check back on pt. As able.  Rico Junker Reo Portela, COTA/L 06/20/2013, 8:12 AM

## 2013-06-20 NOTE — Progress Notes (Signed)
[id number] Cardiac Rehab Completed discharge education with pt. We discussed exercise guidelines, proper use of sl NTG, and Outpt. CRP. Pt is very interested in [place]. CRP will send referral to [facility]. Deon Pilling, RN 06/20/2013 12:38 PM

## 2013-06-22 ENCOUNTER — Other Ambulatory Visit: Payer: Self-pay | Admitting: Oncology

## 2013-06-22 LAB — CULTURE, BLOOD (ROUTINE X 2)
CULTURE: NO GROWTH
Culture: NO GROWTH

## 2013-06-23 ENCOUNTER — Telehealth: Payer: Self-pay | Admitting: *Deleted

## 2013-06-23 ENCOUNTER — Other Ambulatory Visit: Payer: No Typology Code available for payment source

## 2013-06-23 ENCOUNTER — Ambulatory Visit: Payer: No Typology Code available for payment source | Admitting: Oncology

## 2013-06-23 NOTE — Telephone Encounter (Signed)
Left message for friend Theresia Bough to encourage patient to come in today to see Dr [name]. She is concerned about patient and is trying to get any resources available to help patient. Asked her to call us

## 2013-06-23 NOTE — Telephone Encounter (Signed)
Spoke with patient's mother per Dr [name] request to see if we can get patient in for appointment that was scheduled for today. Pt's phone was broken earlier this week. Mother states her daughter is not talking to her as she has been trying to get patient to take care of herself, eat better. She encouraged me to call patient's friend Theresia Bough

## 2013-06-24 ENCOUNTER — Telehealth: Payer: Self-pay | Admitting: Gynecologic Oncology

## 2013-06-24 ENCOUNTER — Encounter: Payer: Self-pay | Admitting: *Deleted

## 2013-06-24 NOTE — Telephone Encounter (Signed)
Attempted to call patient to remind her of her appointment tomorrow with Dr. [name].  Message on her voicemail states "I am out of the hospital and doing well.  My phone is not working so I am unable to return calls until I get a new phone."  Message left.

## 2013-06-24 NOTE — Progress Notes (Signed)
[place] Work  Clinical Social Work received phone call from patient's mother, Ricke Hey [phone]) stating patient has no transportation to tomorrow's appointment at cancer center.  CSW arranged taxi voucher to transport patient to appointment.  Patient plans to meet with patient after appointments to discuss long term transportation plan as well as assess for psychosocial needs and provide support.  Polo Riley, MSW, LCSW, OSW-C Clinical Social Worker [facility] [phone]

## 2013-06-25 ENCOUNTER — Ambulatory Visit: Payer: No Typology Code available for payment source

## 2013-06-25 ENCOUNTER — Ambulatory Visit
Admission: RE | Admit: 2013-06-25 | Discharge: 2013-06-25 | Disposition: A | Discharge status: Home / Self Care | Payer: No Typology Code available for payment source | Source: Ambulatory Visit | Attending: Radiation Oncology | Admitting: Radiation Oncology

## 2013-06-25 ENCOUNTER — Ambulatory Visit: Payer: No Typology Code available for payment source | Admitting: Gynecologic Oncology

## 2013-06-25 DIAGNOSIS — E119 Type 2 diabetes mellitus without complications: Secondary | ICD-10-CM | POA: Insufficient documentation

## 2013-06-25 DIAGNOSIS — D573 Sickle-cell trait: Secondary | ICD-10-CM | POA: Insufficient documentation

## 2013-06-25 DIAGNOSIS — I10 Essential (primary) hypertension: Secondary | ICD-10-CM | POA: Insufficient documentation

## 2013-06-25 DIAGNOSIS — I252 Old myocardial infarction: Secondary | ICD-10-CM | POA: Insufficient documentation

## 2013-06-25 DIAGNOSIS — E785 Hyperlipidemia, unspecified: Secondary | ICD-10-CM | POA: Insufficient documentation

## 2013-06-25 DIAGNOSIS — Z7982 Long term (current) use of aspirin: Secondary | ICD-10-CM | POA: Insufficient documentation

## 2013-06-25 DIAGNOSIS — Z79899 Other long term (current) drug therapy: Secondary | ICD-10-CM | POA: Insufficient documentation

## 2013-06-25 DIAGNOSIS — E669 Obesity, unspecified: Secondary | ICD-10-CM | POA: Insufficient documentation

## 2013-06-25 DIAGNOSIS — C549 Malignant neoplasm of corpus uteri, unspecified: Secondary | ICD-10-CM | POA: Insufficient documentation

## 2013-06-25 DIAGNOSIS — Z90710 Acquired absence of both cervix and uterus: Secondary | ICD-10-CM | POA: Insufficient documentation

## 2013-06-25 DIAGNOSIS — K219 Gastro-esophageal reflux disease without esophagitis: Secondary | ICD-10-CM | POA: Insufficient documentation

## 2013-06-25 HISTORY — DX: Malignant neoplasm of endometrium: C54.1

## 2013-06-26 ENCOUNTER — Ambulatory Visit (HOSPITAL_BASED_OUTPATIENT_CLINIC_OR_DEPARTMENT_OTHER): Payer: No Typology Code available for payment source

## 2013-06-26 ENCOUNTER — Ambulatory Visit (HOSPITAL_BASED_OUTPATIENT_CLINIC_OR_DEPARTMENT_OTHER): Payer: No Typology Code available for payment source | Admitting: Oncology

## 2013-06-26 ENCOUNTER — Ambulatory Visit: Payer: No Typology Code available for payment source | Admitting: Oncology

## 2013-06-26 ENCOUNTER — Other Ambulatory Visit: Payer: No Typology Code available for payment source

## 2013-06-26 ENCOUNTER — Other Ambulatory Visit: Payer: Self-pay | Admitting: Oncology

## 2013-06-26 ENCOUNTER — Telehealth: Payer: Self-pay | Admitting: Oncology

## 2013-06-26 ENCOUNTER — Telehealth: Payer: Self-pay

## 2013-06-26 ENCOUNTER — Encounter: Payer: Self-pay | Admitting: Oncology

## 2013-06-26 ENCOUNTER — Encounter: Payer: Self-pay | Admitting: Cardiovascular Disease

## 2013-06-26 DIAGNOSIS — C541 Malignant neoplasm of endometrium: Secondary | ICD-10-CM

## 2013-06-26 DIAGNOSIS — R918 Other nonspecific abnormal finding of lung field: Secondary | ICD-10-CM

## 2013-06-26 DIAGNOSIS — C549 Malignant neoplasm of corpus uteri, unspecified: Secondary | ICD-10-CM

## 2013-06-26 DIAGNOSIS — E119 Type 2 diabetes mellitus without complications: Secondary | ICD-10-CM

## 2013-06-26 DIAGNOSIS — D649 Anemia, unspecified: Secondary | ICD-10-CM

## 2013-06-26 DIAGNOSIS — I252 Old myocardial infarction: Secondary | ICD-10-CM

## 2013-06-26 LAB — WHOLE BLOOD GLUCOSE
GLUCOSE: 132 mg/dL — AB (ref 70–100)
HRS PC: 5 h

## 2013-06-26 LAB — COMPREHENSIVE METABOLIC PANEL
ALT: 15 U/L (ref 0–35)
AST: 14 U/L (ref 0–37)
Albumin: 3.2 g/dL — ABNORMAL LOW (ref 3.5–5.2)
Alkaline Phosphatase: 148 U/L — ABNORMAL HIGH (ref 39–117)
BUN: 5 mg/dL — ABNORMAL LOW (ref 6–23)
CO2: 27 meq/L (ref 19–32)
CREATININE: 0.49 mg/dL — AB (ref 0.50–1.10)
Calcium: 9.4 mg/dL (ref 8.4–10.5)
Chloride: 104 mEq/L (ref 96–112)
GLUCOSE: 132 mg/dL — AB (ref 70–99)
Potassium: 3.9 mEq/L (ref 3.5–5.3)
Sodium: 144 mEq/L (ref 135–145)
TOTAL PROTEIN: 7.5 g/dL (ref 6.0–8.3)
Total Bilirubin: 0.5 mg/dL (ref 0.3–1.2)

## 2013-06-26 LAB — CBC WITH DIFFERENTIAL/PLATELET
BASO%: 0.8 % (ref 0.0–2.0)
BASOS ABS: 0.1 10*3/uL (ref 0.0–0.1)
EOS ABS: 0.1 10*3/uL (ref 0.0–0.5)
EOS%: 1.5 % (ref 0.0–7.0)
HEMATOCRIT: 27.3 % — AB (ref 34.8–46.6)
HEMOGLOBIN: 8.3 g/dL — AB (ref 11.6–15.9)
LYMPH%: 22.6 % (ref 14.0–49.7)
MCH: 19.1 pg — ABNORMAL LOW (ref 25.1–34.0)
MCHC: 30.4 g/dL — ABNORMAL LOW (ref 31.5–36.0)
MCV: 63 fL — AB (ref 79.5–101.0)
MONO#: 0.9 10*3/uL (ref 0.1–0.9)
MONO%: 10.4 % (ref 0.0–14.0)
NEUT#: 5.7 10*3/uL (ref 1.5–6.5)
NEUT%: 64.7 % (ref 38.4–76.8)
PLATELETS: 446 10*3/uL — AB (ref 145–400)
RBC: 4.33 10*6/uL (ref 3.70–5.45)
RDW: 19.9 % — ABNORMAL HIGH (ref 11.2–14.5)
WBC: 8.8 10*3/uL (ref 3.9–10.3)
lymph#: 2 10*3/uL (ref 0.9–3.3)

## 2013-06-26 LAB — HOLD TUBE, BLOOD BANK

## 2013-06-26 LAB — TECHNOLOGIST REVIEW: Technologist Review: 2

## 2013-06-26 NOTE — Telephone Encounter (Signed)
Called mother Donnetta Hutching and left message regarding approximant as noted below to [place].

## 2013-06-26 NOTE — Telephone Encounter (Signed)
Spoke with sister Ty Hilts at [phone]. She called Vivien Rota and had 3 way converstion.   Dr. [name] spoke with Ms. Rosario-Rivera and patient coming now to [facility] for lab and visit.

## 2013-06-26 NOTE — Telephone Encounter (Signed)
Left vm message for Kelly [name] to see if she could have Kelly [name] call and possibly come in today to see Dr. [name] as she needs close follow up from hospital discharge.  She can call and ask to speak with Dr. Mariana Kaufman nurse to discuss follow up appointment.

## 2013-06-26 NOTE — Progress Notes (Signed)
OFFICE PROGRESS NOTE   06/26/2013   Physicians: Shelva Majestic, Nancy Marus, Jill Alexanders, Gery Pray, [place].  INTERVAL HISTORY:  Patient is seen, alone for visit, after my phone call to her this afternoon in follow up of missed appointments with this MD on 4-14, post op follow up with gyn oncology and consultation visit with Dr Sondra Come on 4-15 (despite taxi transport arranged by [facility] social worker on 4-15), and with this MD earlier today. Patient arrived at [facility] lab at 1650 today, with lab managers very kindly accommodating due to critical need for labs. MD visit began at 1610. 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 She was hospitalized ([facility] then transferred to [facility]) 06-15-13 thru 06-20-13, with STEMI on 06-17-13 for which she had emergent cath and RCA stent placed by Dr Claiborne Billings.  Blood sugars were >400 on admission and IV access was very difficult. Blood counts fortunately were maintained despite recent first chemo.  She nearly left AMA on 06-20-13, but rescinded this and allowed a follow up troponin level as well as some discharge planning/ instructions. RUE PICC was removed due to discomfort. RUE venous doppler 06-20-13 showed apparent chronic cephalic vein clot. CBC prior to DC, which we could not get repeated, had Hgb 7.6 (questionable draw).  Unfortunately she has not gotten either insulin or glucometer as had been anticipated at hospital discharge; [facility], as [place] [facility] closed then. I do not know that she has been approved yet for assistance with [place] ([place] staff + social worker/ chaplain all ready to assist at the appointments that she missed earlier this week). Patient tells me that she is trying to apply for Medicaid, has to do disability application for this. She does have functioning cell phone again ([phone]) and has borrowed friend's car thru 07-02-13. She prefers that we use her cell now in preference to her mother Jeneen Montgomery [phone] or friend Almeta Monas [phone] or sister; I am not sure that the friend is on HIPPA contact list yet).  She has not checked blood sugars since hospital discharge. She has had some chest pressure, has not used NTG. She is not SOB walking in office now. She has bruising right thigh since cath, tender there as it has been. RUE is somewhat tender still at site of prior PICC, no swelling, heat or erythema there. She has not seen bleeding. She denies fever. She has been eating and drinking fluids.  She is willing for me to reschedule consultation to Dr Sondra Come, tho this may also be better in a few weeks. Note may never have had diabetic teaching. I will try to get [facility] dietician involved when possible. I do not believe that cardiac rehab is set up. With ongoing chemotherapy and other constraints now, I am not sure how feasible this is immediately.  I was able to speak directly to Dr Evette Georges office just prior to patient arriving here today, and have made appointment for her to see Dr Evette Georges PA on 06-30-13 at 3:30. Patient given this information verbally and in written form now, with office address and phone. I have emphasized importance of keeping that appointment.    ONCOLOGIC HISTORY   Endometrial ca   05/01/2013 Initial Diagnosis Endometrial ca   05/16/2013 Surgery IIIA endometrial ca with + adnexal involement, negative nodes    Patient had long history of irregular,  heavy vaginal bleeding and pelvic pain, evaluated at various locations since 2000. She was seen in [facility] ED 03-27-13 for increased pain and heavy vaginal bleeding, with CT AP showing large mass involving endometrium 12.5 x 8.0x5.2 cm and apparent left hydrosalpinx. PAP 04-03-13 had atypical squamous cells, negative high risk HPV; endometrial biopsy showed complex hyperplasia with atypia and florid papillary  proliferation, ER, p53 and p16 positive. She saw Dr [name] on 04-30-13 and had robotic assisted total laparoscopic hysterectomy/BSO/bilateral pelvic and para aortic node evalustion at [facility] 05-16-2013. Pathology from [facility] [phone]) had endometrioid adenocarcinoma FIGO grade 2, invading 2 cm into myometrium where wall was 2.5 cm thick, no serosal involvement, lower uterine segment involvement present, cervical involvement present 41m depth, involvement of left fallopian tube and right ovary,lymphovascular space invasion present and extensive, all nodes negative (8 right pelvic, 6 left pelvic, 5 right periaortic, 2 left periaortic). Stains were negative for serous component. [facility], described as screaming and shaking by family, for which she was seen by neurology, with MRI negative and EEG pending. 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 Recommendation was 6 cycles of adjuvant carboplatin taxol chemotherapy given in split course with radiation. I saw her in consultation on 06-09-13 and she [facility], with necessary steroid premedication. She did not keep appointment with PCP to get another glucometer prior to first chemotherapy, and did not use family member's glucometer as recommended to monitor blood sugar after the chemotherapy; she was drinking water, cranberry juice and regular soda (not as instructed). LE pain began ~ day 3, description suggesting taxol related from history now; she developed central chest pressure and SOB with left arm pain on day 5, [facility] Tho initially there was not indication of cardiac etiology, she had acute STEMI on 06-17-13, with cath and RCA stent.   Review of systems as above, also: Some constipation. No cough. No peripheral neuropathy now. Remainder of 10 point Review of Systems  negative.  Objective:  Vital signs in last 24 hours: Weight 253 lb. BP 119/78, HR 100 regular, temp 98.7, O2 sat 100% , Resp 20 not labored in exam room  Alert, oriented and appropriate conversation, cooperative. Does not appear in acute distress.  Ambulatory without assistance, carrying infant.  Alopecia  HEENT:PERRL, sclerae not icteric. Oral mucosa moist without lesions, posterior pharynx clear.  Neck supple. No JVD.  Lymphatics:no cervical,suraclavicular adenopathy Resp: clear to auscultation bilaterally and normal percussion bilaterally Cardio: regular rate and rhythm. No gallop. Rate 100 so may not be on the B blocker. GI: soft, nontender, not distended, no mass or organomegaly. Some bowel sounds. Surgical incisions not remarkable.Right inguinal area tender, no heat or fluctuance, resolving ecchymosis extending down 10-12 cm into thigh. RUE site of PICC not remarkable, no swelling or palpable cord, tender just above the insertion without heat or erythema. Right radial pulse 2+. Musculoskeletal/ Extremities: otherwise without pitting edema, cords, tenderness Neuro: no peripheral neuropathy. Otherwise nonfocal. PSYCH: appears somewhat anxious and tired, interacting appropriately with infant. Skin without rash, ecchymosis, petechiae otherwise   Lab Results: [facility]: WBC 8.8, ANC 5.7, Hgb 8.3, Plt 446k CMET with Na 144, K 3.9, glu 132, creat 0.49, AP 148, other LFTs ok, alb 3.2 Hold tube also drawn due to difficult access, but hemoglobin better so will  not transfuse now.   These labs including blood counts and glucose reviewed with patient now.  Studies/Results:  CT ANGIOGRAPHY CHEST WITH CONTRAST  06-15-13  COMPARISON: CT abdomen/pelvis 03/27/2013  FINDINGS:  Lungs are adequately inflated with minimal nodular opacification  over the medial left lower lobe. Heart size of normal. There is no  evidence of pulmonary embolism. Remaining  mediastinal structures are  within normal. There is no evidence of hilar, mediastinal or  axillary adenopathy.  Images through the upper abdomen are notable only for mild  diverticulosis of the colon. Remaining bones a soft tissues are  within normal.  Review of the MIP images confirms the above findings.  IMPRESSION:  No evidence of pulmonary embolism.  Minimal nonspecific nodular opacification over the medial left lower  lobe which may be due to infection, inflammatory process/ drug  toxicity or metastatic disease. Consider followup CT in 4-6 weeks.   RUE venous doppler 06-20-13 with apparent subacute or chronic clot in cephalic vein  Cath note 06-14-3152 in EMR  reviewed.    Medications: I have reviewed the patient's current medications. I have reviewed medication list with patient now. She is taking ASA 81 mg. She has patient assistance for Brilinta, which I believe that she is taking. She does not know if she is taking the metoprolol. She does not have insulin (novolog or lantus). She is on iron daily and stool softener. She is not sure that she is on lipitor. She has not used protonix in several days "no heartburn" . She continues Trileptal for seizure disorder. She has not used NTG.   She did not bring meds with her. I have asked her to bring all meds to visits here and with cardiology.  DISCUSSION: Extremely complicated situation with recently diagnosed endometrial cancer in 38 yo with uncontrolled DM, first adjuvant chemo (with noncardiotoxic drugs) on 06-11-13, then STEMI on 06/22/13. Her husband died 3 mo ago, one month after they adopted infant daughter. Appropriate medical care is complicated by noncompliance so far, tho she is at least aware today that we are trying very diligently to help her. I will have [place] staff and support staff follow up, including patient assistance funds at [place] if possible and help with disability/ medicaid application if  possible.  She agrees to keep cardiology appointment 06-30-13. I will see her back 4-24 with lab. I have told her that we need to know when cardiology is comfortable with continuing chemotherapy and that she will have to have insulin and glucometer before we can give further chemo. She also needs PAC before further chemo.  She is reminded that she can contact our office at any time prior to scheduled appointments. I have asked her to speak to RN if she needs to reschedule.  Assessment/Plan: 1.Endometrial carcinoma:: IIIA FIGO 2. Presentation and diagnosis Jan 2015, surgery by Dr [name] [facility] 05-16-13. First adjuvant taxol carbo 06-11-13. Further chemo on hold as we address other acute issues, but expect to resume when ok with cardiology and when diabetes manageable. Consultation to Dr Sondra Come needs to be rescheduled when possible. 2.acute inferior STEMI 06-22-13. Cath with RCA thrombectomy and stent then by Dr Claiborne Billings. Probably not on all cardiac meds optimally yet. Appointment set up at that office for 06-20-13. 3.Diabetes: poorly controlled. Required insulin in hospital, has not gotten this or glucometer since DC. Plan as above 4.questionable lung finding on CT angio chest done on admission 06-15-13 (no PE). Will follow up with next scans. Not  clear that this is related to cancer. 5.hx seizure disorder since age 33. Most recently evaluated by neurology at [facility] post op from gyn onc surgery. (Consultation note 09-2008 in this EMR raised question of pseudoseizures, tho record also describes tonic clonic seizures.).  6.peripheral IV access very difficult. She will need PAC prior to next chemo. 7.multifactorial anemia: likely would benefit from IV iron after PAC. Continue oral iron now  8.Social stress with recent death of husband, infant adopted daughter, unable to work, transportation difficulty 21. Sickle trait found on recent hemoglobin electrophoresis. I have not discussed today 10. Presumed GERD symptoms when I met  her, better now without protonix. ?cardiac. 11.obesity tho intentional weight loss >100 lbs in past 3 years 12.right cephalic vein clot not acute by doppler 06-20-13. Local bruising at cath site not unexpected.   Patient expressed appreciation for care today.   Gordy Levan, MD   06/26/2013, 8:33 PM

## 2013-06-26 NOTE — Telephone Encounter (Signed)
pt called to r/s appt..done...pt aware of new d.t °

## 2013-06-26 NOTE — Telephone Encounter (Signed)
Medical Oncology  Patient missed appointment with this MD on 4-13, then did not keep appointment on 4-15 with gyn oncology even with [facility] social worker sending taxi for transportation. Patient called and cancelled appointment with this MD for today, however RN able to reach mother and then sister by phone, and thru sister this MD spoke with patient directly. Patient states that she was not able to get insulin or glucometer since discharge from hospital due to cost. She prefers [facility]. She did not let physicians know that she was unable to fill prescriptions. She complains of pain "when I bend over" at cath site, where she reports large bruise. She is fatigued, has had chest pressure, is eating and drinking, bowels have moved. She has not yet called cardiology to schedule follow up with Dr Shelva Majestic. She then mentioned that she was driving "10 minutes away" while we were speaking and agreed to come to [facility] now. Patient states that her cell is working again, [phone]. I have emphasized that medical conditions are too serious for her to be noncompliant. I mentioned that [facility] financial staff had been ready to assist at previously scheduled appointments. I called Dr Evette Georges office [phone], who tell me that she should be seen ~ a week after MI or after hospital DC, (MI 06-17-13 and DC 06-20-13).  She can be seen by PA on 06-30-13 at 3:30 ([place]).  Awaiting patient's arrival now, with labs as planned if timing allows.  L.Clairessa Boulet,MD

## 2013-06-27 ENCOUNTER — Ambulatory Visit: Payer: No Typology Code available for payment source

## 2013-06-27 ENCOUNTER — Other Ambulatory Visit: Payer: Self-pay | Admitting: Oncology

## 2013-06-27 ENCOUNTER — Encounter: Payer: Self-pay | Admitting: Specialist

## 2013-06-27 ENCOUNTER — Encounter: Payer: Self-pay | Admitting: Oncology

## 2013-06-27 ENCOUNTER — Telehealth: Payer: Self-pay | Admitting: Internal Medicine

## 2013-06-27 ENCOUNTER — Telehealth: Payer: Self-pay | Admitting: Oncology

## 2013-06-27 NOTE — Telephone Encounter (Signed)
s/w pt mom re appts for 4/24 lb/LL and 4/20 @ 3:30pm w/dr Shelva Majestic. per 4/16 pof appt d/t for dt kelly scheduled by LL. per pof nutr appt will be scheduled w/some of the other appts once scheduled as nutr appt cannot be coord w/4/24 f/u appt.

## 2013-06-27 NOTE — Telephone Encounter (Signed)
Pt has an appt with you on Tuesday @ 1:30

## 2013-06-27 NOTE — Progress Notes (Signed)
Called and left a message for the patient to [facility] per Dr. Mariana Kaufman email: Kelly [name] or whichever is correct person --   This is the patient I requested help with patient assistance funds for [facility], then she missed several appointments this week, but came late in day yesterday after most staff gone.   She has not been able to get insulin or glucometer since DC from hospital last week, due to cost.  She needs these ASAP.  I do not know that she has gotten all of heart meds since MI last week.   She understands that you may need her to come to office today to do paperwork -- working cell now is [phone].  Hospital case manager apparently told her some about disability and medicaid application, but she needs more direction - PLEASE.

## 2013-06-27 NOTE — Progress Notes (Unsigned)
Per Dr, [name] email.Marland KitchenMarland Kitchen

## 2013-06-27 NOTE — Telephone Encounter (Signed)
PER Shane-This patient was recently hospitalized. Please call patient about hospital continuity of care. See how they are doing. Review any medication changes per discharge summary. Schedule follow up appointment and make note on schedule that this is hospital f/u continuity of care visit.

## 2013-06-27 NOTE — Telephone Encounter (Signed)
Pt is feeling a lot better, she was on insulin but insulin is to much so she went to metformin.

## 2013-06-27 NOTE — Progress Notes (Signed)
Have left messages for patient and finally reached her by phone. She talked about thinking she is "depressed" and feeling "hopeless." I assessed her for possible counseling referral but she said she wanted to come to see me rather than a counselor. We made an appointment for her to see me next Thursday, April 23, at 2:00.  Epifania Gore, [facility], PhD Chaplain

## 2013-06-30 ENCOUNTER — Ambulatory Visit: Payer: No Typology Code available for payment source | Admitting: Cardiology

## 2013-06-30 ENCOUNTER — Telehealth: Payer: Self-pay | Admitting: *Deleted

## 2013-07-01 ENCOUNTER — Inpatient Hospital Stay: Payer: Self-pay | Admitting: Medical

## 2013-07-02 ENCOUNTER — Encounter: Payer: Self-pay | Admitting: Medical

## 2013-07-02 ENCOUNTER — Ambulatory Visit (INDEPENDENT_AMBULATORY_CARE_PROVIDER_SITE_OTHER): Payer: No Typology Code available for payment source | Admitting: Medical

## 2013-07-02 ENCOUNTER — Telehealth: Payer: Self-pay

## 2013-07-02 VITALS — BP 98/60 | HR 92 | Temp 98.9°F | Resp 16 | Wt 246.0 lb

## 2013-07-02 DIAGNOSIS — Z598 Other problems related to housing and economic circumstances: Secondary | ICD-10-CM

## 2013-07-02 DIAGNOSIS — Z5987 Material hardship: Secondary | ICD-10-CM

## 2013-07-02 DIAGNOSIS — F4321 Adjustment disorder with depressed mood: Secondary | ICD-10-CM

## 2013-07-02 DIAGNOSIS — E1165 Type 2 diabetes mellitus with hyperglycemia: Principal | ICD-10-CM

## 2013-07-02 DIAGNOSIS — I213 ST elevation (STEMI) myocardial infarction of unspecified site: Secondary | ICD-10-CM

## 2013-07-02 DIAGNOSIS — G40909 Epilepsy, unspecified, not intractable, without status epilepticus: Secondary | ICD-10-CM

## 2013-07-02 DIAGNOSIS — I219 Acute myocardial infarction, unspecified: Secondary | ICD-10-CM

## 2013-07-02 DIAGNOSIS — IMO0001 Reserved for inherently not codable concepts without codable children: Secondary | ICD-10-CM

## 2013-07-02 DIAGNOSIS — Z599 Problem related to housing and economic circumstances, unspecified: Secondary | ICD-10-CM

## 2013-07-02 DIAGNOSIS — C541 Malignant neoplasm of endometrium: Secondary | ICD-10-CM

## 2013-07-02 DIAGNOSIS — D649 Anemia, unspecified: Secondary | ICD-10-CM

## 2013-07-02 DIAGNOSIS — C549 Malignant neoplasm of corpus uteri, unspecified: Secondary | ICD-10-CM

## 2013-07-02 MED ORDER — METFORMIN HCL 1000 MG PO TABS: 1000.0000 mg | ORAL_TABLET | Freq: Once | ORAL | Status: DC

## 2013-07-02 MED ORDER — GLIPIZIDE 5 MG PO TABS: 5.0000 mg | ORAL_TABLET | Freq: Two times a day (BID) | ORAL | Status: AC

## 2013-07-02 NOTE — Telephone Encounter (Signed)
Left a message in Ms. Kelly [name]'s voice mail that Dr. [name] said to keep appointment with her on Friday.  Ms. Kelly [name] will be able to make the cardiology appointment.

## 2013-07-02 NOTE — Progress Notes (Signed)
Subjective:   Kelly [name] is a 38 y.o. female presenting on 07/02/2013 with Follow-up  My last visit with her was over a year ago.  She is here for hospital followup.  In the last few months has had an unfortunate complicated medication history.  Diagnosed with endometrial cancer 06/09/13.  Husband died of heart attack unexpectedly recently within last few months.  She is now a single mom with a toddler daughter.  She lives with mother and sister who are helping take care of her daughter.   Went to the ED recently for chest pain, diagnosed with STEMI, had stent placed.   Her diabetes has been uncontrolled.   She unfortunately never came back here for f/u from a year ago regarding diabetes.  Since the early April hospitalization she was suppose to be on Lantus and mealtime Novolog, but given the fact she has NO prescription insuracne coverage, all medications are out of pocket.  She was unable to get any insulin, unable to get glucometer or testing supplies, not checking glucose.   She has had 2 recent HgbA1C.  2 wk ago HgbA1C was 8.8%, but she says one done just last week was a little better but she doesn't recall the number.    She has f/u cardiology this Friday.  She has her first radiation oncology appt next week.  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   Was able to get the first 30 days free with social worker's help with financial assistance, but she thinks she will not be able to afford this after this month.  She is applying for Medicaid.    She has been offered counseling through the oncology center, but hasn't stated this yet.   She is seeing the Chaplain next wee.   No other complaint.  Review of Systems ROS as in subjective      Objective:    BP 98/60  Pulse 92  Temp(Src) 98.9 F (37.2 C) (Oral)  Resp 16  Wt 246 lb (111.585 kg)  LMP 02/24/2013  General appearance: alert, no  distress, WD/WN Heart: RRR, normal S1, S2, no murmurs Lungs: CTA bilaterally, no wheezes, rhonchi, or rales Pulses: 2+ symmetric      Assessment: Encounter Diagnoses  Name Primary?  . Type II or unspecified type diabetes mellitus without mention of complication, uncontrolled Yes  . STEMI (ST elevation myocardial infarction)   . Endometrial carcinoma   . Anemia   . Seizure disorder   . Financial difficulty   . Mourning      Plan: DM type 2 - she was discharged on insulin, Lantus as well as NovoLog meal time, however she was unable to afford either.  She is currently taking metformin 1000 mg once daily, glipizide 5 mg once daily.  He most recent hemoglobin A1c I have was 8.8% from 2 weeks ago.  She also does not have a glucometer.  We gave her a glucometer today, starter pack of test strips, prescription for test strips, and recommend she check at least every morning and other times a day as we discussed, gave her a log sheet to record her numbers.  We discussed goal glucose readings. Discussed hypoglycemia.  I asked her get me glucose readings in 1-2 weeks.  For the sake of cost and ease of use, I asked her to increase the metformin and glipizide to twice daily.  Discussed possible side effects and interactions. Briefly discussed  diet.  If her 2 wk glucose numbers are worse than expected, I advised that we can probably supply her with samples of insulin for a period of time.    STEMI - she has f/u with Dr. Claiborne Billings this Friday.  Endometrial carcinoma - she has started chemotherapy, meets with her radiology oncologist next week.  Per 05/2013 gyn/oncology notes, endometrial biopsy revealed complex hyperplasia with atypia in a concerning pattern. The patient underwent definitive surgery at [facility] on March 6. 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  Anemia - multifactorial, continue iron, per oncology notes will be pursuing IV iron once PAC  placed  Seizure disorder -   I advised she discuss with neurology changing to cheaper anticonvulsant for costs consideration.   She has been on Depakote prior.   Financial difficulty - of note, she has no prescription insurance coverage at this time.   She is trying to obtain Medicaid at this time.  I advised she discuss with neurology changing to cheaper anticonvulsant for costs consideration.   She has been on Depakote prior.   Mourning - she has a visit with the chaplain Thursday, and strongly encouraged her to begin counseling with the oncology office as has been offered, recommend she find a church home.  Counseled on her anxiety with the enormity of her recent health problems and loss of her husband unexpectedly.   Kelly [name] was seen today for follow-up.  Diagnoses and associated orders for this visit:  Type II or unspecified type diabetes mellitus without mention of complication, uncontrolled  STEMI (ST elevation myocardial infarction)  Endometrial carcinoma  Anemia  Seizure disorder  Financial difficulty  Mourning    Return she will return glucose numbers in 1-2 wk.

## 2013-07-03 ENCOUNTER — Encounter: Payer: Self-pay | Admitting: Medical

## 2013-07-03 ENCOUNTER — Encounter: Payer: Self-pay | Admitting: *Deleted

## 2013-07-03 ENCOUNTER — Encounter: Payer: Self-pay | Admitting: Specialist

## 2013-07-03 NOTE — Progress Notes (Signed)
[place] Work  Clinical Social Work met with pt after being referred by the chaplain due to transportation issues. Pt reports she has a car, but it has needed repairs thus she has not been able to get to appointments. Pt shared many stressors at home and limited family support locally. Pt reports she will return to her appointments tomorrow. CSW discussed [place] resources, community resources and how to apply. Pt open to GYN support group and any resources available. Pt has applied for medicaid and disability, these are pending. She gets food stamps and has [place] for her 75 month old daughter. Pt aware that [place] to check on her during her appointment tomorrow.   Clinical Social Work interventions: Supportive listening Resource assistance  Loren Racer, LCSW Clinical Social Worker [place] for [place] Wednesday, Thursday and Friday Phone: [phone] Fax: [phone]

## 2013-07-03 NOTE — Progress Notes (Signed)
Patient came to meet with me. She told her story. It has been an exceedingly rough year for her emotionally and physically. Chaplain provided empathetic listening and compassionate support. Facilitated her connection to social work for information about possible resources since her financial resources are limited and she is in a bad living situation at her mother's home. Will continue to support and follow.  Epifania Gore, [facility], PhD Chaplain

## 2013-07-04 ENCOUNTER — Encounter: Payer: Self-pay | Admitting: Oncology

## 2013-07-04 ENCOUNTER — Ambulatory Visit (HOSPITAL_BASED_OUTPATIENT_CLINIC_OR_DEPARTMENT_OTHER): Payer: No Typology Code available for payment source | Admitting: Oncology

## 2013-07-04 ENCOUNTER — Telehealth: Payer: Self-pay | Admitting: Internal Medicine

## 2013-07-04 ENCOUNTER — Other Ambulatory Visit (HOSPITAL_BASED_OUTPATIENT_CLINIC_OR_DEPARTMENT_OTHER): Payer: No Typology Code available for payment source

## 2013-07-04 ENCOUNTER — Encounter: Payer: Self-pay | Admitting: Medical

## 2013-07-04 ENCOUNTER — Encounter: Payer: Self-pay | Admitting: *Deleted

## 2013-07-04 ENCOUNTER — Ambulatory Visit (INDEPENDENT_AMBULATORY_CARE_PROVIDER_SITE_OTHER): Payer: No Typology Code available for payment source | Admitting: Cardiology

## 2013-07-04 VITALS — BP 127/86 | HR 96 | Temp 99.0°F | Resp 18 | Ht 66.0 in | Wt 247.0 lb

## 2013-07-04 VITALS — BP 126/80 | HR 94 | Ht 66.0 in | Wt 248.0 lb

## 2013-07-04 DIAGNOSIS — R002 Palpitations: Secondary | ICD-10-CM

## 2013-07-04 DIAGNOSIS — C541 Malignant neoplasm of endometrium: Secondary | ICD-10-CM

## 2013-07-04 DIAGNOSIS — C549 Malignant neoplasm of corpus uteri, unspecified: Secondary | ICD-10-CM

## 2013-07-04 DIAGNOSIS — E119 Type 2 diabetes mellitus without complications: Secondary | ICD-10-CM

## 2013-07-04 DIAGNOSIS — D649 Anemia, unspecified: Secondary | ICD-10-CM

## 2013-07-04 DIAGNOSIS — R918 Other nonspecific abnormal finding of lung field: Secondary | ICD-10-CM

## 2013-07-04 DIAGNOSIS — I82619 Acute embolism and thrombosis of superficial veins of unspecified upper extremity: Secondary | ICD-10-CM

## 2013-07-04 DIAGNOSIS — I252 Old myocardial infarction: Secondary | ICD-10-CM

## 2013-07-04 DIAGNOSIS — I2510 Atherosclerotic heart disease of native coronary artery without angina pectoris: Secondary | ICD-10-CM

## 2013-07-04 DIAGNOSIS — I10 Essential (primary) hypertension: Secondary | ICD-10-CM

## 2013-07-04 LAB — CBC WITH DIFFERENTIAL/PLATELET
BASO%: 0.4 % (ref 0.0–2.0)
Basophils Absolute: 0 10e3/uL (ref 0.0–0.1)
EOS%: 1.2 % (ref 0.0–7.0)
Eosinophils Absolute: 0.1 10e3/uL (ref 0.0–0.5)
HCT: 30.2 % — ABNORMAL LOW (ref 34.8–46.6)
HGB: 9.2 g/dL — ABNORMAL LOW (ref 11.6–15.9)
LYMPH%: 20.5 % (ref 14.0–49.7)
MCH: 18.8 pg — ABNORMAL LOW (ref 25.1–34.0)
MCHC: 30.4 g/dL — ABNORMAL LOW (ref 31.5–36.0)
MCV: 62 fL — ABNORMAL LOW (ref 79.5–101.0)
MONO#: 0.5 10e3/uL (ref 0.1–0.9)
MONO%: 4.6 % (ref 0.0–14.0)
NEUT#: 7.4 10e3/uL — ABNORMAL HIGH (ref 1.5–6.5)
NEUT%: 73.3 % (ref 38.4–76.8)
Platelets: 403 10e3/uL — ABNORMAL HIGH (ref 145–400)
RBC: 4.87 10e6/uL (ref 3.70–5.45)
RDW: 20.4 % — ABNORMAL HIGH (ref 11.2–14.5)
WBC: 10.1 10e3/uL (ref 3.9–10.3)
lymph#: 2.1 10e3/uL (ref 0.9–3.3)

## 2013-07-04 MED ORDER — DEXAMETHASONE 4 MG PO TABS: 20.0000 mg | ORAL_TABLET | ORAL | Status: DC

## 2013-07-04 MED ORDER — CARVEDILOL 12.5 MG PO TABS: 12.5000 mg | ORAL_TABLET | Freq: Two times a day (BID) | ORAL | Status: AC

## 2013-07-04 NOTE — Telephone Encounter (Signed)
Faxed over medical records to Disability Determination Services on April 14 @ [phone]

## 2013-07-04 NOTE — Progress Notes (Signed)
OFFICE PROGRESS NOTE   07/04/2013   Physicians:Thomas [facility], [place], [address] [place], [place].   INTERVAL HISTORY:  Patient is seen, alone for visit, in continuing attention to adjuvant chemotherapy begun with taxol carboplatin 06-11-13 for IIIA grade 2 endometrial carcinoma, with situation complicated since then by acute MI on 06-17-13, as well as ongoing issues with poorly controlled diabetes and social/ financial situation.  Counts have recovered since first chemo and we have held cycle 2 until stable to proceed. She has had palpitations intermittently in past 24 hours, has scheduled visit back to cardiology today; she has 1 month supply of Brilinta but did not qualify for further assistance (?), will discuss with cardiology today. She has not been able to afford insulin, did get glucometer, blood sugars ranging 98-230 in AMs and was 167 today. Dr Lanice Shirts office is assisting with diabetes management, however certainly I can write the insulin prescriptions if needed for [place] assistance funds, for which she still needs to apply.  [employer] Education officer, museum met with patient already today, and Energy manager involved. Hopefully she will qualify for Medicaid (?)  Peripheral IV access is not adequate for chemo; she will need PAC prior to next treatment.  Patient was contacted by [facility] re genetics counseling there; we will set up thru [facility] instead when rest of situation allows, but she does not need to work in a trip to [facility] now.   ONCOLOGIC HISTORY   Endometrial ca   05/01/2013 Initial Diagnosis Endometrial ca   05/16/2013 Surgery IIIA endometrial ca with + adnexal involement, negative nodes  Patient had long history of irregular, heavy vaginal bleeding and pelvic pain, evaluated at various locations since 2000. She was seen in [facility] ED 03-27-13 for increased pain and heavy vaginal bleeding, with CT AP showing large mass involving endometrium 12.5 x 8.0x5.2  cm and apparent left hydrosalpinx. PAP 04-03-13 had atypical squamous cells, negative high risk HPV; endometrial biopsy showed complex hyperplasia with atypia and florid papillary proliferation, ER, p53 and p16 positive. She saw Dr [name] on 04-30-13 and had robotic assisted total laparoscopic hysterectomy/BSO/bilateral pelvic and para aortic node evalustion at [facility] 05-16-2013. Pathology from [facility] [id number]) had endometrioid adenocarcinoma FIGO grade 2, invading 2 cm into myometrium where wall was 2.5 cm thick, no serosal involvement, lower uterine segment involvement present, cervical involvement present 49m depth, involvement of left fallopian tube and right ovary,lymphovascular space invasion present and extensive, all nodes negative (8 right pelvic, 6 left pelvic, 5 right periaortic, 2 left periaortic). Stains were negative for serous component. [facility], described as screaming and shaking by family, for which she was seen by neurology, with MRI negative and EEG pending. 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 Recommendation was 6 cycles of adjuvant carboplatin taxol chemotherapy given in split course with radiation. I saw her in consultation on 06-09-13 and [facility], with necessary steroid premedication. She did not keep appointment with PCP to get another glucometer prior to first chemotherapy, and did not use family member's glucometer as recommended to monitor blood sugar after the chemotherapy; she was drinking water, cranberry juice and regular soda (not as instructed). LE pain began ~ day 3, description suggesting taxol related from history now; she developed central chest pressure and SOB with left arm pain on day 5, at which time she was admitted  to [facility] ICU. Tho initially there was not indication of cardiac etiology, she had acute STEMI on  06-17-13, with cath and RCA stent.   Review of systems as above, also: No chest pain today. Not sleeping well "too much on my mind". No new or different pain. Not clear how well she is following diet. No fever. SOB not worse. No reflux symptoms. Remainder of 10 point Review of Systems negative.  Adopted daughter Cherylann Banas is 51 old today, sleeps all night.  Objective:  Vital signs in last 24 hours:  BP 127/86  Pulse 96  Temp(Src) 99 F (37.2 C) (Oral)  Resp 18  Ht 5' 6"  (1.676 m)  Wt 247 lb (112.038 kg)  BMI 39.89 kg/m2  LMP 02/24/2013  Alert, oriented and appropriate conversation. Ambulatory without difficulty. Respirations not labored RA.  No alopecia  HEENT:PERRL, sclerae not icteric. Oral mucosa moist without lesions, posterior pharynx clear.  Neck supple. No JVD.  Lymphatics:no cervical,suraclavicular adenopathy Resp: clear to auscultation bilaterally and normal percussion bilaterally Cardio: regular rate and rhythm. No gallop. Clear heart sounds GI: abdomen obese, soft, nontender, not distended, no appreciabe mass or organomegaly. Some bowel sounds. Surgical incision not remarkable. Musculoskeletal/ Extremities: without pitting edema, cords, tenderness Neuro: no peripheral neuropathy. Otherwise nonfocal Skin without rash, ecchymosis, petechiae   Lab Results:  Results for orders placed in visit on 07/04/13  CBC WITH DIFFERENTIAL      Result Value Ref Range   WBC 10.1  3.9 - 10.3 10e3/uL   NEUT# 7.4 (*) 1.5 - 6.5 10e3/uL   HGB 9.2 (*) 11.6 - 15.9 g/dL   HCT 30.2 (*) 34.8 - 46.6 %   Platelets 403 (*) 145 - 400 10e3/uL   MCV 62.0 (*) 79.5 - 101.0 fL   MCH 18.8 (*) 25.1 - 34.0 pg   MCHC 30.4 (*) 31.5 - 36.0 g/dL   RBC 4.87  3.70 - 5.45 10e6/uL   RDW 20.4 (*) 11.2 - 14.5 %   lymph# 2.1  0.9 - 3.3 10e3/uL   MONO# 0.5  0.1 - 0.9 10e3/uL   Eosinophils Absolute 0.1  0.0 - 0.5 10e3/uL   Basophils Absolute 0.0  0.0 - 0.1 10e3/uL   NEUT% 73.3  38.4 - 76.8 %   LYMPH%  20.5  14.0 - 49.7 %   MONO% 4.6  0.0 - 14.0 %   EOS% 1.2  0.0 - 7.0 %   BASO% 0.4  0.0 - 2.0 %     Studies/Results:  No results found.  Medications: I have reviewed the patient's current medications. Not yet on insulin, Brilinta assistance not clear. To return paperwork for application for [facility] pharmacy assistance.  DISCUSSION: She understands that she needs to be stable from cardiology standpoint and diabetes adequately managed prior to second chemo. Did not discuss with her, but I would consider carboplatin only for cycle 2 in order to avoid extreme hyperglycemia from taxol steroids and taxol aches. She will see me 4-29 and we can set up next chemo from that appointment if stable; she is to see Dr Sondra Come also on 4-29 (rescheduled consult). Note patient late for MD with multiple needs being addressed today, and had to leave this office to get to cardiology appointment  Assessment/Plan:  1.Endometrial carcinoma:: IIIA FIGO 2. Presentation and diagnosis Jan 2015, [name]. First adjuvant taxol carbo 06-11-13. Further chemo on hold as we address other acute issues, but expect to resume when ok with cardiology and when  diabetes manageable. Consider single agent carbo for cycle 2, then add back taxol subsequently if so. 2.acute inferior STEMI 06-17-13. Cath with RCA thrombectomy and stent then by Dr Claiborne Billings. Has had some chest pain and palpitations since hospital DC. Follow up with cardiology today. 3.Diabetes: poorly controlled. Required insulin in hospital, has not gotten this since DC for financial reasons.. Plan as above  4.questionable lung finding on CT angio chest done on admission 06-15-13 (no PE). Will follow up with next scans. Not clear that this is related to cancer.  5.hx seizure disorder since age 32. Most recently evaluated by neurology at [facility] post op from gyn onc surgery. (Consultation note 09-2008 in this EMR raised question of pseudoseizures, tho record also  describes tonic clonic seizures.).  6.peripheral IV access very difficult. She will need PAC prior to next chemo.  7.multifactorial anemia: likely would benefit from IV iron after PAC. Continue oral iron now  8.Social stress with recent death of husband, infant adopted daughter, unable to work, transportation difficulty  69. Sickle trait found on recent hemoglobin electrophoresis. I have not discussed yet.  10. Presumed GERD symptoms when I met her, better now without protonix. ?cardiac.  11.obesity tho intentional weight loss >100 lbs in past 3 years  12.right cephalic vein clot not acute by doppler 06-20-13. Local bruising at cath site not unexpected.   Patient expressed appreciation for care and knows that she can call if needed before next visit.    Gordy Levan, MD   07/04/2013, 4:57 PM

## 2013-07-04 NOTE — Progress Notes (Signed)
Spoke to pt regarding assistance with her medication.  We talked about the grant thru [facility].  She is not working so she will bring a letter of support from her mother.  Once received she can get approved for the funds.

## 2013-07-04 NOTE — Progress Notes (Signed)
GYN Location of Tumor / Histology: Endometrial carcinoma   Patient presented with along history of irregular, heavy vaginal bleeding and pelvic pain for 15 years.   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  Depth of invasion into cervix is 6 mm in a wall thickness of 10 mm (60%)  (anterior endocervix, A3).  Adnexal involvement: present involving left fallopian tube and right ovary  (see specimens B and E)  Other involved sites: not applicable  Cervical/vaginal margin and distance: Negative. Tumor is 2.3 cm from  the external os by gross measurement.  Lymphovascular space invasion: present and extensive including large vessel  invasion  B: Fallopian tube, right, salpingectomy and left fallopian tube and ovary,  left, salpingo-oophorotomy  - Positive for adenocarcinoma involving left fallopian tube; background severe  chronic salpingitis, hydrosalpinx, adhesions and calcifications  - Left ovary with adhesions, endosalpingiosis and polycystic follicles, negative  for malignancy  - Right fallopian tube with adhesions, cautery artifact and cystic [place]  rests; no malignancy identified  - Lymphovascular invasion seen in the left fallopian tube  C: Lymph nodes, right pelvic, regional resection  - Eight lymph nodes negative for metastatic carcinoma (0/8)  D:  Lymph nodes, left pelvic, regional resection  - Six lymph nodes, negative for metastatic carcinoma (0/6)  E: Ovary, right, oophorectomy  - Positive for adenocarcinoma involving a microscopic surface focus  - Background polycystic ovary with adhesions  F: Lymph nodes, right periaortic, biopsy  - Four lymph nodes with cautery artifact, negative for malignancy (0/4)  G: Lymph nodes, left periaortic, biopsy  - Two lymph nodes, negative for malignancy (0/2)  Comment:  The tumor shows a predominantly endometrioid cytology with extensive papillary  architecture.   Past/Anticipated interventions by Gyn/Onc surgery, if any: post robotic assisted laparoscopic TAH BSO and bilateral pelvic and periaortic node evaluation by Dr Nancy Marus at [facility] 05-16-2013   Past/Anticipated interventions by medical oncology, if any: Recommendation was 6 cycles of adjuvant carboplatin taxol chemotherapy given in split course with radiation per Dr. [name]. She had cycle 1 taxol carboplatin at [facility] on 06-11-13.   Weight changes, if any: lost 10 lbs since diagnosis   Bowel/Bladder complaints, if any: constipation - takes miralax and colace daily  Nausea/Vomiting, if any: no   Pain issues, if any: had chest pain last night.   SAFETY ISSUES:  Prior radiation? no  Pacemaker/ICD? no  Possible current pregnancy? no  Is the patient on methotrexate? No  Current Complaints / other details: Patient had acute inferior STEMI 06-17-13: cath with RCA thrombectomy and stent.  Has a 71 month old daughter.  Had chest pain last night.  Did not take nitroglycerin.  She is pain free now.

## 2013-07-04 NOTE — Progress Notes (Signed)
Patient ID: Kelly [name], female   DOB: 1975/08/07, 38 y.o.   MRN: [id number]    07/07/2013 Kelly [name]   June 20, 1975  [id number]  Primary Physicia [name], PA-C Primary Cardiologist: Dr. Claiborne Billings  HPI:  [name] is a 38 year old, AA female, who was recently diagnosed with  endometrial carcinoma, s/p total hysterectomy on 05/16/13. Chemotherapy treatments were initiated~ first part of April. She also has a history of diabetes mellitus, morbid obesity and a long-standing history of hypertension.  She presented to [facility] on [facility]13, several days after the start of chemo, with a complaint of severe chest discomfort. EKG revealed inferior ST segment elevations. Troponin was positive. CODE STEMI was activated and she was taken to the lab for urgent catheterization, performed by Dr. Claiborne Billings. She was found to have a proximal RCA thrombus, felt to be due to an ulcerative plaque. This was successfully treated with PCI utilizing a DES. Left ventriculography revealed an ejection fraction of 55%. There was mild mid inferior hypocontractility.  She was placed on DAPT with ASA + Brilinta, as well as Lopressor and Lipitor.  She presents to clinic today for post-hospital f/u. She states that she has been doing well from a cardiac standpoint. She denies recurrent chest pain. No SOB. She dose note however mild intermittent palpations, but denies dizziness, syncope/ near syncope. She states she has been fully compliant with all of her prescribed medications, including Brilinta. She states her chemotherapy has been on hold, as her oncologist recommended that she first be cleared by cardiology before resuming treatments. She has continued to have difficulty obtaining medication coverage by her current insurance company, and is in the process of applying for Medicaid.    Current Outpatient Prescriptions  Medication Sig Dispense Refill  . aspirin EC 81 MG EC tablet Take 1 tablet (81 mg  total) by mouth daily.  30 tablet  0  . atorvastatin (LIPITOR) 80 MG tablet Take 1 tablet (80 mg total) by mouth daily at 6 PM.  30 tablet  1  . docusate sodium (STOOL SOFTENER) 100 MG capsule Take 200 mg by mouth 2 (two) times daily.       . ferrous sulfate 325 (65 FE) MG tablet Take 325 mg by mouth.      Marland Kitchen glipiZIDE (GLUCOTROL) 5 MG tablet Take 1 tablet (5 mg total) by mouth 2 (two) times daily before a meal.  60 tablet  5  . metFORMIN (GLUCOPHAGE) 1000 MG tablet Take 1 tablet (1,000 mg total) by mouth once.  60 tablet  5  . Multiple Vitamins-Minerals (MULTIVITAMIN WITH MINERALS) tablet Take 1 tablet by mouth daily.      . nitroGLYCERIN (NITROSTAT) 0.4 MG SL tablet Place 1 tablet (0.4 mg total) under the tongue every 5 (five) minutes as needed for chest pain.  30 tablet  1  . OXcarbazepine (TRILEPTAL) 150 MG tablet Take 150 mg by mouth daily.       Marland Kitchen oxyCODONE-acetaminophen (PERCOCET/ROXICET) 5-325 MG per tablet Take by mouth every 4 (four) hours as needed for severe pain.      . pantoprazole (PROTONIX) 40 MG tablet Take 1 tablet (40 mg total) by mouth daily.  30 tablet  2  . polyethylene glycol (MIRALAX / GLYCOLAX) packet Take 17 g by mouth daily.      . Ticagrelor (BRILINTA) 90 MG TABS tablet Take 1 tablet (90 mg total) by mouth 2 (two) times daily.  60 tablet  3  . carvedilol (COREG) 12.5 MG tablet Take 1  tablet (12.5 mg total) by mouth 2 (two) times daily.  180 tablet  3  . LORazepam (ATIVAN) 1 MG tablet Place 1/2- 1 tab under tongue or swallow every 6 hr as needed for nausea.  Take 1 tab the night of chemo.  Will make drowsy  20 tablet  0   No current facility-administered medications for this visit.    Allergies  Allergen Reactions  . Amoxicillin Nausea And Vomiting, Swelling and Rash    Rash, vomiting, throat closing Rash, vomiting, throat closing  . Flagyl [Metronidazole] Nausea And Vomiting    04/30/13-Per pt she is unable to tolerate this medication, causes rash 04/30/13-Per pt  she is unable to tolerate this medication, causes rash  . Adhesive [Tape] Rash and Dermatitis    "takes skin off" "takes skin off"    History   Social History  . Marital Status: Widowed    Spouse Name: N/A    Number of Children: N/A  . Years of Education: N/A   Occupational History  . Not on file.   Social History Main Topics  . Smoking status: Never Smoker   . Smokeless tobacco: Never Used  . Alcohol Use: No  . Drug Use: No  . Sexual Activity: Yes    Birth Control/ Protection: None   Other Topics Concern  . Not on file   Social History Narrative   As of 06/2013: Widowed Mar 17, 2013, husband died unexpectedly of MI.   Adopted infant daughter in 02/2013.  Lives with mother and sister.       Review of Systems: General: negative for chills, fever, night sweats or weight changes.  Cardiovascular: negative for chest pain, dyspnea on exertion, edema, orthopnea, palpitations, paroxysmal nocturnal dyspnea or shortness of breath Dermatological: negative for rash Respiratory: negative for cough or wheezing Urologic: negative for hematuria Abdominal: negative for nausea, vomiting, diarrhea, bright red blood per rectum, melena, or hematemesis Neurologic: negative for visual changes, syncope, or dizziness All other systems reviewed and are otherwise negative except as noted above.    Blood pressure 126/80, pulse 94, height 5\' 6"  (1.676 m), weight 248 lb (112.492 kg), last menstrual period 02/24/2013.  General appearance: alert, cooperative and no distress Neck: no carotid bruit and no JVD Lungs: clear to auscultation bilaterally Heart: regular rate and rhythm, S1, S2 normal, no murmur, click, rub or gallop Extremities: no LEE Pulses: 2+ and symmetric Skin: warm and dry Neurologic: Grossly normal  EKG NSR, 94 bpm  ASSESSMENT AND PLAN:   CAD (coronary artery disease) s/p Inferior STEMI 06/15/13, s/p PCI +DES to mid RCA She denies any recurrent angina. Continue DAPT with ASA +  Brilinta. Also resume BB and statin. She was provided several weeks supply of free Brlinta samples, as she has no insurance coverage. She is in the process of applying for Medicaid.    Hypertension Well controlled today at 126/80. Will resume on BB. Patient is currently on Lopressor. However, due to issues with medication cost, will switch to Coreg, as this is on the $4 Rx plan at [facility] and [facility]. Pt instructed to finish supply of Lopressor and switch to Coreg when time to refill.   DIABETES MELLITUS, TYPE II Managed by PCP. Pt was informed that Metformin and glipizide are also available through the $4 dollar plan and was encouraged to start using either the [facility] or [facility] pharmacy.   Endometrial ca Her chemotherapy agents were reviewed by Dr. Sallyanne Kuster. Theses are not cardiotoxic, and we do not know of any direct  correlation between chemotherapy and increased risk for CV events. She is not on hormone replacement. Therefore, she may resume chemotherapy treatments.     PLAN  She appears stable from a cardiac standpoint, post inferior STEMI. Continue current medications. Lopressor was changed to Coreg to assist with medication cost. Free Brilinta samples also given. She was instructed to notify our office is she runs out. She is stable to resume chemotherapy. She will need to f/u with Dr. Claiborne Billings in 4-6 weeks or sooner if problems recur.   Brittainy SimmonsPA-C 07/07/2013 1:32 PM

## 2013-07-04 NOTE — Patient Instructions (Signed)
Your physician recommends that you schedule a follow-up appointment in:  4 to 6 weeks to see Dr. Claiborne Billings  Stop taking  Your metoprolol and start taking coreg 12.5 mg two times a day

## 2013-07-06 ENCOUNTER — Other Ambulatory Visit: Payer: Self-pay | Admitting: Oncology

## 2013-07-07 ENCOUNTER — Encounter: Payer: Self-pay | Admitting: Cardiology

## 2013-07-07 ENCOUNTER — Telehealth: Payer: Self-pay | Admitting: Dietician

## 2013-07-07 DIAGNOSIS — I10 Essential (primary) hypertension: Secondary | ICD-10-CM | POA: Insufficient documentation

## 2013-07-07 DIAGNOSIS — I2510 Atherosclerotic heart disease of native coronary artery without angina pectoris: Secondary | ICD-10-CM | POA: Insufficient documentation

## 2013-07-07 NOTE — Assessment & Plan Note (Addendum)
She denies any recurrent angina. Continue DAPT with ASA + Brilinta. Also resume BB and statin. She was provided several weeks supply of free Brlinta samples, as she has no insurance coverage. She is in the process of applying for Medicaid.

## 2013-07-07 NOTE — Assessment & Plan Note (Signed)
Managed by PCP. Pt was informed that Metformin and glipizide are also available through the $4 dollar plan and was encouraged to start using either the [facility] or [facility] pharmacy.

## 2013-07-07 NOTE — Assessment & Plan Note (Signed)
Her chemotherapy agents were reviewed by Dr. Sallyanne Kuster. Theses are not cardiotoxic, and we do not know of any direct correlation between chemotherapy and increased risk for CV events. She is not on hormone replacement. Therefore, she may resume chemotherapy treatments.

## 2013-07-07 NOTE — Assessment & Plan Note (Signed)
Well controlled today at 126/80. Will resume on BB. Patient is currently on Lopressor. However, due to issues with medication cost, will switch to Coreg, as this is on the $4 Rx plan at [facility] and [facility]. Pt instructed to finish supply of Lopressor and switch to Coreg when time to refill.

## 2013-07-07 NOTE — Telephone Encounter (Signed)
Brief Outpatient Oncology Nutrition Note  Patient has been identified to be at risk on malnutrition screen.  Wt Readings from Last 10 Encounters:  07/04/13 248 lb (112.492 kg)  07/04/13 247 lb (112.038 kg)  07/02/13 246 lb (111.585 kg)  06/26/13 253 lb 1.6 oz (114.805 kg)  06/20/13 247 lb 4.8 oz (112.175 kg)  06/20/13 247 lb 4.8 oz (112.175 kg)  06/04/13 263 lb (119.296 kg)  04/30/13 259 lb 8 oz (117.708 kg)  04/14/13 264 lb (119.75 kg)  04/03/13 258 lb 8 oz (117.255 kg)      Dx:  Endometrial Cancer dx 03/27/13.  Patient of Dr. [name].  Patient s/p STEMI 06/17/13 s/p stent and to undergo chemo if cleared by Cardiology.  Patient with 84 month old adopted daughter. Hx of DM with HgbA1C of 8.8 4/15 and 10/3 1/14.  Hx of obesity with intentinal weight loss >100 lbs in the past 3 years per chart.  Called patient due to weight loss.  Patient reports appetite has decreased.  States that she ate healthy before but now taste buds have changed and is craving more salads. Discussed importance of adequate protein and calorie intake.  Discussed tips to help improve blood sugar.  Patient appreciative.    [place] RD available as needed.  Antonieta Iba, RD, LDN  .

## 2013-07-09 ENCOUNTER — Encounter: Payer: Self-pay | Admitting: Radiation Oncology

## 2013-07-09 ENCOUNTER — Ambulatory Visit (HOSPITAL_BASED_OUTPATIENT_CLINIC_OR_DEPARTMENT_OTHER): Payer: No Typology Code available for payment source | Admitting: Oncology

## 2013-07-09 ENCOUNTER — Other Ambulatory Visit: Payer: No Typology Code available for payment source

## 2013-07-09 ENCOUNTER — Ambulatory Visit
Admission: RE | Admit: 2013-07-09 | Discharge: 2013-07-09 | Disposition: A | Discharge status: Home / Self Care | Payer: No Typology Code available for payment source | Source: Ambulatory Visit | Attending: Radiation Oncology | Admitting: Radiation Oncology

## 2013-07-09 VITALS — BP 115/71 | HR 85 | Temp 98.4°F | Ht 66.0 in | Wt 249.4 lb

## 2013-07-09 DIAGNOSIS — C541 Malignant neoplasm of endometrium: Secondary | ICD-10-CM

## 2013-07-09 DIAGNOSIS — C549 Malignant neoplasm of corpus uteri, unspecified: Secondary | ICD-10-CM

## 2013-07-09 NOTE — Progress Notes (Signed)
Please see the Nurse Progress Note in the MD Initial Consult Encounter for this patient. 

## 2013-07-09 NOTE — Progress Notes (Signed)
Radiation Oncology         [phone] ________________________________  Initial Outpatient Consultation  Name: Kelly [name] MRN: [id number]  Date: 07/09/2013  DOB: 1975/11/10  [id number], DAVID Audelia Acton, PA-C  Gordy Levan, MD , Nancy Marus, MD  REFERRING PHYSICIAN: Gordy Levan, MD  DIAGNOSIS: Endometrial ca   Primary site: Corpus Uteri - Carcinoma   Staging method: AJCC 7th Edition   Clinical: Stage IIIA (T3a, N0, M0)   Pathologic: Stage IIIA (T3a, N0, M0)   Summary: Stage IIIA (T3a, N0, M0)   HISTORY OF PRESENT ILLNESS::Kelly [name] is a 38 y.o. female who is seen out courtesy of Dr. [name] for an opinion concerning radiation therapy as part of management of patient's advanced endometrial cancer.. Patient had long history of irregular, heavy vaginal bleeding and pelvic pain, evaluated at various locations since 2000. She was seen in [facility] ED 03-27-13 for increased pain and heavy vaginal bleeding, with CT AP showing large mass involving endometrium 12.5 x 8.0x5.2 cm and apparent left hydrosalpinx. PAP 04-03-13 had atypical squamous cells, . She saw Dr [name] on 04-30-13 and had robotic assisted total laparoscopic hysterectomy/BSO/bilateral pelvic and para aortic node evalustion at [facility] 05-16-2013. Pathology from [facility] [phone]) had endometrioid adenocarcinoma FIGO grade 2, invading 2 cm into myometrium where wall was 2.5 cm thick, no serosal involvement, lower uterine segment involvement present, cervical involvement present 32mm depth, involvement of left fallopian tube and right ovary,lymphovascular space invasion present and extensive, all nodes negative (8 right pelvic, 6 left pelvic, 5 right periaortic, 2 left periaortic). . Post operative course at [facility] was complicated by presumed seizure, the patient developed chest pain after her first cycle of chemotherapy and ultimately was found to have acute STEMI on 06-17-13, with cath and RCA stent. The patient will  proceed with her second cycle of chemotherapy in the near future. According to patient she does have cardiac clearance to proceed with additional chemotherapy treatments,  Patient is now seen in radiation oncology for consultation in consideration for postoperative treatments.    PREVIOUS RADIATION THERAPY: No  PAST MEDICAL HISTORY:  has a past medical history of Obesity; GERD (gastroesophageal reflux disease); Endometrial cancer (05/2013); Type II or unspecified type diabetes mellitus without mention of complication, uncontrolled; STEMI (ST elevation myocardial infarction) (06/17/13); Hypertension; Anemia; Seizures; Sickle cell trait; Hyperlipidemia; and Anxiety.    PAST SURGICAL HISTORY: Past Surgical History  Procedure Laterality Date  . Abdominal hysterectomy  05/16/13    [facility], Dr. Nancy Marus  . Cardiac catheterization  06/17/13    extensive thrombus with proximal third RCA 90% stenosis, stent placed, Dr. Shelva Majestic    FAMILY HISTORY: family history includes Cancer in her paternal aunt; Depression in her mother; Diabetes in her sister; Hypertension in her father and sister. There is no history of Heart disease.  SOCIAL HISTORY:  reports that she has never smoked. She has never used smokeless tobacco. She reports that she does not drink alcohol or use illicit drugs. patient is widowed with her husband dying unexpectedly soon after adoption of a child late last year. 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 aspirin EC 81 MG EC tablet Take 1 tablet (81 mg total) by mouth daily.  30 tablet  0  . atorvastatin (LIPITOR) 80 MG tablet Take 1 tablet (80 mg total) by mouth daily at 6 PM.  30 tablet  1  . carvedilol (COREG) 12.5 MG tablet Take 1 tablet (12.5 mg total) by mouth 2 (two) times daily.  180 tablet  3  . docusate  sodium (STOOL SOFTENER) 100 MG capsule Take 200 mg by mouth 2 (two) times daily.       . ferrous sulfate 325 (65 FE) MG tablet Take 325 mg by mouth.      Marland Kitchen glipiZIDE (GLUCOTROL) 5 MG tablet Take 1 tablet (5 mg total) by mouth 2 (two) times daily before a meal.  60 tablet  5  . LORazepam (ATIVAN) 1 MG tablet Place 1/2- 1 tab under tongue or swallow every 6 hr as needed for nausea.  Take 1 tab the night of chemo.  Will make drowsy  20 tablet  0  . metFORMIN (GLUCOPHAGE) 1000 MG tablet Take 1 tablet (1,000 mg total) by mouth once.  60 tablet  5  . Multiple Vitamins-Minerals (MULTIVITAMIN WITH MINERALS) tablet Take 1 tablet by mouth daily.      . nitroGLYCERIN (NITROSTAT) 0.4 MG SL tablet Place 1 tablet (0.4 mg total) under the tongue every 5 (five) minutes as needed for chest pain.  30 tablet  1  . OXcarbazepine (TRILEPTAL) 150 MG tablet Take 150 mg by mouth daily.       . pantoprazole (PROTONIX) 40 MG tablet Take 1 tablet (40 mg total) by mouth daily.  30 tablet  2  . polyethylene glycol (MIRALAX / GLYCOLAX) packet Take 17 g by mouth daily.      . Ticagrelor (BRILINTA) 90 MG TABS tablet Take 1 tablet (90 mg total) by mouth 2 (two) times daily.  60 tablet  3  . oxyCODONE-acetaminophen (PERCOCET/ROXICET) 5-325 MG per tablet Take by mouth every 4 (four) hours as needed for severe pain.       No current facility-administered medications for this encounter.    REVIEW OF SYSTEMS:  A 15 point review of systems is documented in the electronic medical record. This was obtained by the nursing staff. However, I reviewed this with the patient to discuss relevant findings and make appropriate changes.  She did have a lot of pelvic pain prior to her hysterectomy. This issue is resolved. She does have significant problems with chronic severe constipation. She is on a bowel regimen this time which is helpful. She denies any urination difficulties. She denies any rectal bleeding. She admits to having hemorrhoids after  severe episodes of constipation.    PHYSICAL EXAM:  height is 5\' 6"  (1.676 m) and weight is 249 lb 6.4 oz (113.127 kg). Her temperature is 98.4 F (36.9 C). Her blood pressure is 115/71 and her pulse is 85. Her oxygen saturation is 100%.   BP 115/71  Pulse 85  Temp(Src) 98.4 F (36.9 C)  Ht 5\' 6"  (1.676 m)  Wt 249 lb 6.4 oz (113.127 kg)  BMI 40.27 kg/m2  SpO2 100%  LMP 02/24/2013  General Appearance:    Alert, cooperative, no distress, appears stated age, quite pleasant. She is a good historian and communicates well   Head:    Normocephalic, without obvious abnormality, atraumatic  Eyes:    PERRL, conjunctiva/corneas clear, EOM's intact,        Nose:   Nares normal, septum midline, mucosa normal, no drainage    or sinus tenderness  Throat:   Lips, mucosa, and tongue normal; teeth and gums normal  Neck:   Supple, symmetrical, trachea midline, no adenopathy;    thyroid:  no enlargement/tenderness/nodules; no  carotid   bruit or JVD  Back:     Symmetric, no curvature, ROM normal, no CVA tenderness  Lungs:     Clear to auscultation bilaterally, respirations unlabored  Chest Wall:    No tenderness or deformity   Heart:    Regular rate and rhythm,       Abdomen:     Soft, non-tender, bowel sounds active all four quadrants,    no masses, no organomegaly, laparoscopic scars have healed well without signs of drainage or infection   Genitalia:   deferred until simulation and planning day   Rectal:   deferred until simulation and planning day   Extremities:   Extremities normal, atraumatic, no cyanosis or edema  Pulses:   2+ and symmetric all extremities  Skin:   Skin color, texture, turgor normal, no rashes or lesions  Lymph nodes:   Cervical, supraclavicular, and axillary nodes normal  Neurologic:    normal strength, sensation and reflexes    throughout     ECOG = 1    1 - Symptomatic but completely ambulatory (Restricted in physically strenuous activity but ambulatory and able to  carry out work of a light or sedentary nature. For example, light housework, office work)  LABORATORY DATA:  Lab Results  Component Value Date   WBC 10.1 07/04/2013   HGB 9.2* 07/04/2013   HCT 30.2* 07/04/2013   MCV 62.0* 07/04/2013   PLT 403* 07/04/2013   NEUTROABS 7.4* 07/04/2013   Lab Results  Component Value Date   NA 144 06/26/2013   K 3.9 06/26/2013   CL 104 06/26/2013   CO2 27 06/26/2013   GLUCOSE 132* 06/26/2013   CREATININE 0.49* 06/26/2013   CALCIUM 9.4 06/26/2013    Pathology from [facility]gery from 05/16/2013 revealed:   Diagnosis:  A: Uterus, cervix, hysterectomy  Histologic type: endometrioid adenocarcinoma with areas of mucinous  differentiation (see comment)  Histologic grade: FIGO grade 2 (architecture grade 2, nuclear grade 2)  Tumor site: anterior and posterior endometrium involving lower uterine  segments and anterior and posterior endocervix  Myometrial invasion: outer half  Depth: 2 cm Wall thickness: 2.5 cm Percent: 80%  (FSA1)  Serosal involvement: not identified  Lower uterine segment involvement: present, involving anterior and posterior  lower uterine segments  Cervical involvement: Present involving anterior and posterior endocervix.  Depth of invasion into cervix is 6 mm in a wall thickness of 10 mm (60%)  (anterior endocervix, A3).  Adnexal involvement: present involving left fallopian tube and right ovary  (see specimens B and E)  Other involved sites: not applicable  Cervical/vaginal margin and distance: Negative. Tumor is 2.3 cm from  the external os by gross measurement.  Lymphovascular space invasion: present and extensive including large vessel  invasion  B: Fallopian tube, right, salpingectomy and left fallopian tube and ovary,  left, salpingo-oophorotomy  - Positive for adenocarcinoma involving left fallopian tube; background severe  chronic salpingitis, hydrosalpinx, adhesions and calcifications  - Left ovary with adhesions,  endosalpingiosis and polycystic follicles, negative  for malignancy  - Right fallopian tube with adhesions, cautery artifact and cystic Walthard  rests; no malignancy identified  - Lymphovascular invasion seen in the left fallopian tube  C: Lymph nodes, right pelvic, regional resection  - Eight lymph nodes negative for metastatic carcinoma (0/8)  D: Lymph nodes, left pelvic, regional resection  - Six lymph nodes, negative for metastatic carcinoma (0/6)  E: Ovary, right, oophorectomy  - Positive for adenocarcinoma involving a microscopic  surface focus  - Background polycystic ovary with adhesions  F: Lymph nodes, right periaortic, biopsy  - Four lymph nodes with cautery artifact, negative for malignancy (0/4)  G: Lymph nodes, left periaortic, biopsy  - Two lymph nodes, negative for malignancy (0/2)  Comment:  The tumor shows a predominantly endometrioid cytology with extensive papillary  architecture.     RADIOGRAPHY: Ct Head Wo Contrast  06/16/2013   CLINICAL DATA:  Tingling and numbness left upper extremity in both lower extremities. History of seizures.  EXAM: CT HEAD WITHOUT CONTRAST  TECHNIQUE: Contiguous axial images were obtained from the base of the skull through the vertex without intravenous contrast.  COMPARISON:  09/14/2008  FINDINGS: Ventricles, cisterns and other CSF spaces are within normal. There is no mass, mass effect, shift of midline structures or acute hemorrhage. No evidence of acute infarction. Remaining bones and soft tissues are within normal.  IMPRESSION: No acute intracranial findings.   Electronically Signed   By: Marin Olp M.D.   On: 06/16/2013 01:34   Ct Angio Chest Pe W/cm &/or Wo Cm  06/15/2013   CLINICAL DATA:  Chest pain and shortness of breath. History of stage III endometrial cancer. Currently on chemotherapy.  EXAM: CT ANGIOGRAPHY CHEST WITH CONTRAST  TECHNIQUE: Multidetector CT imaging of the chest was performed using the standard protocol during bolus  administration of intravenous contrast. Multiplanar CT image reconstructions and MIPs were obtained to evaluate the vascular anatomy.  CONTRAST:  165mL OMNIPAQUE IOHEXOL 350 MG/ML SOLN  COMPARISON:  CT abdomen/pelvis 03/27/2013  FINDINGS: Lungs are adequately inflated with minimal nodular opacification over the medial left lower lobe. Heart size of normal. There is no evidence of pulmonary embolism. Remaining mediastinal structures are within normal. There is no evidence of hilar, mediastinal or axillary adenopathy.  Images through the upper abdomen are notable only for mild diverticulosis of the colon. Remaining bones a soft tissues are within normal.  Review of the MIP images confirms the above findings.  IMPRESSION: No evidence of pulmonary embolism.  Minimal nonspecific nodular opacification over the medial left lower lobe which may be due to infection, inflammatory process/ drug toxicity or metastatic disease. Consider followup CT in 4-6 weeks.   Electronically Signed   By: Marin Olp M.D.   On: 06/15/2013 23:46   Dg Chest Port 1 View  06/20/2013   CLINICAL DATA:  Chest pain.  Attempted right arm PICC placement.  EXAM: PORTABLE CHEST - 1 VIEW  COMPARISON:  06/18/2013  FINDINGS: Heart size is normal. Mediastinal shadows are normal. Lungs are clear. No effusions. No bony abnormalities. Poor inspiration.  IMPRESSION: Poor inspiration.  No active disease.   Electronically Signed   By: Nelson Chimes M.D.   On: 06/20/2013 09:24   Dg Chest Port 1 View  06/18/2013   CLINICAL DATA:  Status post PICC line placement  EXAM: PORTABLE CHEST - 1 VIEW  COMPARISON:  None.  FINDINGS: The patient has undergone right-sided PICC line placement. The tip of the catheter lies in the region of the junction of the SVC with the right atrium. The lungs are hypoinflated. The cardiac silhouette is mildly enlarged. The perihilar interstitial markings are prominent.  IMPRESSION: There is no evidence of immediate postprocedure  complication following placement of a PICC line via the right upper extremity.  These results were called by telephone at the time of interpretation on 06/18/2013 at 1:06 PM to Terese Door, RN, who verbally acknowledged these results.   Electronically Signed   By:  David  Martinique   On: 06/18/2013 13:07   Dg Abd Portable 2v  06/18/2013   CLINICAL DATA:  Abdominal tenderness  EXAM: PORTABLE ABDOMEN - 2 VIEW  COMPARISON:  CT abdomen and pelvis March 27, 2013  FINDINGS: Supine and left lateral decubitus abdomen images were obtained. The bowel gas pattern is unremarkable. No obstruction or free air is appreciable. There are a few small presumed phleboliths in the pelvis.  IMPRESSION: The bowel gas pattern is unremarkable.   Electronically Signed   By: Lowella Grip M.D.   On: 06/18/2013 17:56      IMPRESSION: Endometrial ca   Primary site: Corpus Uteri - Carcinoma   Staging method: AJCC 7th Edition   Clinical: Stage IIIA (T3a, N0, M0)   Pathologic: Stage IIIA (T3a, N0, M0)   Summary: Stage IIIA (T3a, N0, M0) Patient is at high risk for recurrence and I would recommend adjuvant radiation therapy as part of patient's overall management.. This recommendation would include both external beam and intracavitary brachy therapy treatments given the findings of cervical involvement as well as lymphovascular space invasion on pathologic review. Today I discussed the treatment course side effects and potential toxicities of radiation therapy in this situation with the patient. She appears to understand and wishes to proceed with planned course of treatment.  PLAN: Patient will proceed with 2 additional cycles of chemotherapy and then return for radiation planning and treatment.  She then proceed with 3 additional cycles of chemotherapy after she has completed her external beam and intracavitary brachytherapy treatments. Total time spent in this encounter including medical records reviewed, x-ray study are  reviewed, examination, counseling, and coordination of care was 65 minutes   ------------------------------------------------  Blair Promise, PhD, MD

## 2013-07-10 ENCOUNTER — Telehealth: Payer: Self-pay | Admitting: Cardiovascular Disease

## 2013-07-10 ENCOUNTER — Encounter: Payer: Self-pay | Admitting: Oncology

## 2013-07-10 ENCOUNTER — Other Ambulatory Visit: Payer: Self-pay | Admitting: Radiology

## 2013-07-10 ENCOUNTER — Ambulatory Visit (HOSPITAL_BASED_OUTPATIENT_CLINIC_OR_DEPARTMENT_OTHER): Payer: No Typology Code available for payment source | Admitting: Oncology

## 2013-07-10 ENCOUNTER — Telehealth: Payer: Self-pay | Admitting: *Deleted

## 2013-07-10 ENCOUNTER — Telehealth: Payer: Self-pay | Admitting: Oncology

## 2013-07-10 ENCOUNTER — Ambulatory Visit (HOSPITAL_BASED_OUTPATIENT_CLINIC_OR_DEPARTMENT_OTHER): Payer: No Typology Code available for payment source

## 2013-07-10 VITALS — BP 132/86 | HR 93 | Temp 98.9°F | Resp 18 | Ht 66.0 in | Wt 247.3 lb

## 2013-07-10 DIAGNOSIS — C549 Malignant neoplasm of corpus uteri, unspecified: Secondary | ICD-10-CM

## 2013-07-10 DIAGNOSIS — D573 Sickle-cell trait: Secondary | ICD-10-CM

## 2013-07-10 DIAGNOSIS — C541 Malignant neoplasm of endometrium: Secondary | ICD-10-CM

## 2013-07-10 DIAGNOSIS — E119 Type 2 diabetes mellitus without complications: Secondary | ICD-10-CM

## 2013-07-10 LAB — COMPREHENSIVE METABOLIC PANEL (CC13)
ALT: 13 U/L (ref 0–55)
ANION GAP: 12 meq/L — AB (ref 3–11)
AST: 10 U/L (ref 5–34)
Albumin: 3.2 g/dL — ABNORMAL LOW (ref 3.5–5.0)
Alkaline Phosphatase: 161 U/L — ABNORMAL HIGH (ref 40–150)
BUN: 4.6 mg/dL — AB (ref 7.0–26.0)
CALCIUM: 9.3 mg/dL (ref 8.4–10.4)
CO2: 23 mEq/L (ref 22–29)
CREATININE: 0.7 mg/dL (ref 0.6–1.1)
Chloride: 106 mEq/L (ref 98–109)
Glucose: 274 mg/dl — ABNORMAL HIGH (ref 70–140)
Potassium: 3.3 mEq/L — ABNORMAL LOW (ref 3.5–5.1)
Sodium: 141 mEq/L (ref 136–145)
Total Bilirubin: 0.76 mg/dL (ref 0.20–1.20)
Total Protein: 7.6 g/dL (ref 6.4–8.3)

## 2013-07-10 LAB — CBC WITH DIFFERENTIAL/PLATELET
BASO%: 0.4 % (ref 0.0–2.0)
Basophils Absolute: 0 10*3/uL (ref 0.0–0.1)
EOS%: 1.8 % (ref 0.0–7.0)
Eosinophils Absolute: 0.2 10*3/uL (ref 0.0–0.5)
HEMATOCRIT: 31.1 % — AB (ref 34.8–46.6)
HEMOGLOBIN: 9.7 g/dL — AB (ref 11.6–15.9)
LYMPH%: 12.3 % — AB (ref 14.0–49.7)
MCH: 19.2 pg — AB (ref 25.1–34.0)
MCHC: 31 g/dL — AB (ref 31.5–36.0)
MCV: 61.9 fL — ABNORMAL LOW (ref 79.5–101.0)
MONO#: 0.5 10*3/uL (ref 0.1–0.9)
MONO%: 5.2 % (ref 0.0–14.0)
NEUT#: 7.6 10*3/uL — ABNORMAL HIGH (ref 1.5–6.5)
NEUT%: 80.3 % — AB (ref 38.4–76.8)
PLATELETS: 302 10*3/uL (ref 145–400)
RBC: 5.03 10*6/uL (ref 3.70–5.45)
RDW: 19.7 % — ABNORMAL HIGH (ref 11.2–14.5)
WBC: 9.4 10*3/uL (ref 3.9–10.3)
lymph#: 1.2 10*3/uL (ref 0.9–3.3)

## 2013-07-10 MED ORDER — POTASSIUM CHLORIDE CRYS ER 20 MEQ PO TBCR: 20.0000 meq | EXTENDED_RELEASE_TABLET | Freq: Every day | ORAL | Status: DC

## 2013-07-10 MED ORDER — INSULIN ASPART 100 UNIT/ML ~~LOC~~ SOLN: SUBCUTANEOUS | Status: DC

## 2013-07-10 NOTE — Telephone Encounter (Signed)
Pt notified of low potassium, discussed high potassium foods that might not raise blood sugar. Also told patient to pick up prescription for potassium tablets to take daily for 5 days. And insulin has also been ordered. Dr [name] will determine sliding scale. Voice mail left for dietician to give suggestions on high potassium foods that will not increase blood sugar.

## 2013-07-10 NOTE — Progress Notes (Signed)
Pt is approved for the $400 [facility] and $400 [employer] grants.

## 2013-07-10 NOTE — Telephone Encounter (Signed)
Tammy from [facility] office is calling to see if the orders were sent for the patient to have cardiac rehab set up.. Please call    Thanks

## 2013-07-10 NOTE — Progress Notes (Signed)
OFFICE PROGRESS NOTE   07/10/2013   Physicians:Thomas Megan [place], [place], [address] [place], [place].   INTERVAL HISTORY:  Patient is seen, accompanied by infant daughter, in continuing attention to IIIA grade 2 endometrial carcinoma, course complicated by acute MI after cyce 1 chemo and by poorly controlled diabetes. This is rescheduled visit today, as she missed my appointment earlier this week. She did have consultation with Dr Sondra Come and has met with [facility] financial staff and social worker; she saw cardiology on 07-07-13 and is stable from their standpoint to proceed with chemotherapy.She is to follow up with Dr Lanice Shirts office next week by phone re diabetes. She has not started insulin as planned at hospital DC due to unable to afford, however blood sugars are improved and assistance is now available thru [place].  Patient complains of central chest discomfort which has happened last 2 nights about 30 min after going to bed, not associated with N/V, diaphoresis, palpitations and not relieved with SL NTG once. Discomfort better when she lay still and resolved last pm after 30 min.  She had episode of nausea with vomiting this AM, none otherwise. Blood sugar this AM 115 and last pm before eating 102, on glucotrol and glucophage, without insulin as noted above. She does not seem to be following diet instructions.  She has not heard from cardiac rehab as yet; RN to contact cardiac rehab to follow up now. [facility] SW can arrange childcare during chemo.  Peripheral IV access is too poor for chemotherapy. She is in agreement with PAC by IR, which we will set up prior to next chemotherapy.   ONCOLOGIC HISTORY   Endometrial ca   05/01/2013 Initial Diagnosis Endometrial ca   05/16/2013 Surgery IIIA endometrial ca with + adnexal involement, negative nodes  Patient had long history of irregular, heavy vaginal bleeding and pelvic pain, evaluated at various locations  since 2000. She was seen in [facility] ED 03-27-13 for increased pain and heavy vaginal bleeding, with CT AP showing large mass involving endometrium 12.5 x 8.0x5.2 cm and apparent left hydrosalpinx. PAP 04-03-13 had atypical squamous cells, negative high risk HPV; endometrial biopsy showed complex hyperplasia with atypia and florid papillary proliferation, ER, p53 and p16 positive. She saw Dr [name] on 04-30-13 and had robotic assisted total laparoscopic hysterectomy/BSO/bilateral pelvic and para aortic node evalustion at [facility] 05-16-2013. Pathology from [facility] [phone]) had endometrioid adenocarcinoma FIGO grade 2, invading 2 cm into myometrium where wall was 2.5 cm thick, no serosal involvement, lower uterine segment involvement present, cervical involvement present 14m depth, involvement of left fallopian tube and right ovary,lymphovascular space invasion present and extensive, all nodes negative (8 right pelvic, 6 left pelvic, 5 right periaortic, 2 left periaortic). Stains were negative for serous component. [facility], described as screaming and shaking by family, for which she was seen by neurology, with MRI negative and EEG pending. 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 Recommendation was 6 cycles of adjuvant carboplatin taxol chemotherapy given in split course with radiation. I saw her in consultation on 06-09-13 and [facility], with necessary steroid premedication. She did not keep appointment with PCP to get another glucometer prior to first chemotherapy, and did not use family member's glucometer as recommended to monitor blood sugar after the chemotherapy; she was drinking water, cranberry juice and regular soda (not as instructed).  LE pain began ~ day 3, description suggesting taxol related from history now; she developed central chest pressure and  SOB with left arm pain on day 5, at which time she was admitted to [facility] ICU. Tho initially there was not indication of cardiac etiology, she had acute STEMI on 06-17-13, with cath and RCA stent.   Review of systems as above, also: No fever. Is SOB with exertion such as doing laundry, walking in park and climbing stairs. No cough. No abdominal or pelvic pain. No bleeding. Difficulty sleeping due to stress. No reported seizure activity. No GERD. Bowels moving now. Remainder of 10 point Review of Systems negative.  Objective:  Vital signs in last 24 hours:  BP 132/86  Pulse 93  Temp(Src) 98.9 F (37.2 C) (Oral)  Resp 18  Ht _0  (1.676 m)  Wt 247 lb 4.8 oz (112.175 kg)  BMI 39.93 kg/m2  LMP 02/24/2013 Weight is stable. Alert, oriented and appropriate. Ambulatory without difficulty in office, pushing stroller. Alopecia  HEENT:PERRL, sclerae not icteric. Oral mucosa moist without lesions, posterior pharynx clear.  Neck supple. No JVD.  Lymphatics:no cervical,suraclavicular adenopathy Resp: clear to auscultation bilaterally and normal percussion bilaterally Cardio: regular rate and rhythm. No gallop. GI: abdomen obese, soft, nontender, not distended, no mass or organomegaly. Normally active bowel sounds. Surgical incision not remarkable. Musculoskeletal/ Extremities: without pitting edema, cords, tenderness. No swelling RUE site of previous PAC. Neuro: no peripheral neuropathy. Otherwise nonfocal. Mood and affect appropriate. Skin without rash, ecchymosis, petechiae   Lab Results:  Results for orders placed in visit on 07/10/13  CBC WITH DIFFERENTIAL      Result Value Ref Range   WBC 9.4  3.9 - 10.3 10e3/uL   NEUT# 7.6 (*) 1.5 - 6.5 10e3/uL   HGB 9.7 (*) 11.6 - 15.9 g/dL   HCT 31.1 (*) 34.8 - 46.6 %   Platelets 302  145 - 400 10e3/uL   MCV 61.9 (*) 79.5 - 101.0 fL   MCH 19.2 (*) 25.1 - 34.0 pg   MCHC 31.0 (*) 31.5 - 36.0 g/dL   RBC 5.03  3.70 - 5.45 10e6/uL   RDW 19.7 (*) 11.2  - 14.5 %   lymph# 1.2  0.9 - 3.3 10e3/uL   MONO# 0.5  0.1 - 0.9 10e3/uL   Eosinophils Absolute 0.2  0.0 - 0.5 10e3/uL   Basophils Absolute 0.0  0.0 - 0.1 10e3/uL   NEUT% 80.3 (*) 38.4 - 76.8 %   LYMPH% 12.3 (*) 14.0 - 49.7 %   MONO% 5.2  0.0 - 14.0 %   EOS% 1.8  0.0 - 7.0 %   BASO% 0.4  0.0 - 2.0 %  COMPREHENSIVE METABOLIC PANEL (PF79)      Result Value Ref Range   Sodium 141  136 - 145 mEq/L   Potassium 3.3 (*) 3.5 - 5.1 mEq/L   Chloride 106  98 - 109 mEq/L   CO2 23  22 - 29 mEq/L   Glucose 274 (*) 70 - 140 mg/dl   BUN 4.6 (*) 7.0 - 26.0 mg/dL   Creatinine 0.7  0.6 - 1.1 mg/dL   Total Bilirubin 0.76  0.20 - 1.20 mg/dL   Alkaline Phosphatase 161 (*) 40 - 150 U/L   AST 10  5 - 34 U/L   ALT 13  0 - 55 U/L   Total Protein 7.6  6.4 - 8.3 g/dL   Albumin 3.2 (*) 3.5 - 5.0 g/dL   Calcium 9.3  8.4 -  10.4 mg/dL   Anion Gap 12 (*) 3 - 11 mEq/L    Potassium to be started  Labs from 06-04-13 had serum iron 16 and %sat 4.  Studies/Results:  No results found.  Medications: I have reviewed the patient's current medications. Potassium 20 mEq daily x 5 ordered. Ferrous fumarate 325 mg on empty stomach with Vit C. We have called the Novolog insulin to [place], as she likely will need this on SS when taxol is resumed, due to steroids necessary with that agent.   DISCUSSION: I have asked patient to call cardiology to report the hs chest pain. I have also asked her to try additional pillows, and ok to use prn NSAID with food for now.  Assessment/Plan: 1.Endometrial carcinoma:: IIIA FIGO 2. Presentation and diagnosis Jan 2015, surgery by Dr [name] [facility] 05-16-13. First adjuvant taxol carbo 06-11-13. Will try to resume treatment using single agent carboplatin for cycle 2 next week, then hopefully to resume full taxol + [place] with the following treatment. 2.acute inferior STEMI 06-17-13. Cath with RCA thrombectomy and stent then by Dr Claiborne Billings. She will talk with cardiology about the hs chest  pain. Per cardiology, ok to proceed with chemo. 3.Diabetes: poorly controlled. Required insulin in hospital, has not gotten this since DC for financial reasons, however blood sugars much better. With assistance funds now at [facility], will have Novolog available for SS with chemo. Appreciate Dr Redmond School and staff assistance with diabetes management. 4.questionable lung finding on CT angio chest done on admission 06-15-13 (no PE). Will follow up with next scans. Not clear that this is related to cancer.  5.hx seizure disorder since age 76. Most recently evaluated by neurology at [facility] post op from gyn onc surgery. (Consultation note 09-2008 in this EMR raised question of pseudoseizures, tho record also describes tonic clonic seizures.).  6. Needs PAC prior to next chemo, to be set up  7.multifactorial anemia: may benefit from IV iron after PAC. Continue oral iron now but will change to ferrous fumarate for less GI side effects  8.Social stress with recent death of husband, infant adopted daughter, unable to work, transportation difficulty  60. Sickle trait found on recent hemoglobin electrophoresis. I have not discussed yet.  10. Presumed GERD symptoms when I met her, better now without protonix. ?cardiac.  11.obesity tho intentional weight loss >100 lbs in past 3 years  12.right cephalic vein clot not acute by doppler 06-20-13. Local bruising at cath site not unexpected.   PAC set up for 5-4 and carboplatin for 07-15-13. I will see her 07-23-13 with labs.      Gordy Levan, MD   07/10/2013, 1:14 PM

## 2013-07-10 NOTE — Telephone Encounter (Signed)
, °

## 2013-07-10 NOTE — Telephone Encounter (Signed)
Message copied by Patton Salles on Thu Jul 10, 2013  4:16 PM ------      Message from: Evlyn Clines P      Created: Thu Jul 10, 2013  4:07 PM       Labs seen and need follow up: please let her know potassium is a little low. Increase potassium in diet until she can pick up potassium tablets from [facility] ------

## 2013-07-10 NOTE — Patient Instructions (Signed)
Let cardiology know about your chest pain at night. Tell them that nitroglycerin did not help.  Try aleve with supper tonight; try extra pillows.  Dr [name] left message with cardiac rehab now, so hopefully they will be back in touch to set up.  We will change to a different type of iron that is easier on your stomach. This will be thru [place] when you get that coverage. Best to take the iron on empty stomach with vitamin C tablet (you can also get the vitamin C from [place]).    We will try to get just the short acting insulin from [place], and will give you instructions for using this after chemo  We will set up portacath by interventional radiology department at hospital, before next chemo next week.  You will have just one chemo medicine for this next treatment, which does not need the steroids so will be easier with blood sugars; this one also will not cause aches.

## 2013-07-10 NOTE — Telephone Encounter (Signed)
Message forwarded to Dr. Kelly/Wanda, CMA.  

## 2013-07-11 ENCOUNTER — Telehealth: Payer: Self-pay | Admitting: *Deleted

## 2013-07-11 ENCOUNTER — Telehealth: Payer: Self-pay | Admitting: Oncology

## 2013-07-11 ENCOUNTER — Encounter (HOSPITAL_COMMUNITY): Payer: Self-pay | Admitting: Pharmacy Technician

## 2013-07-11 NOTE — Telephone Encounter (Signed)
Per staff message from MD I have scheduled appt 

## 2013-07-11 NOTE — Telephone Encounter (Signed)
, °

## 2013-07-11 NOTE — Telephone Encounter (Signed)
I have tried to schedule appts per POF, but waiting on message from MD regarding treatment. Scheduler advised

## 2013-07-12 NOTE — Progress Notes (Signed)
Medical Oncology  Opened in error, patient FTKA MD visit today.  Godfrey Pick, MD

## 2013-07-14 ENCOUNTER — Encounter (HOSPITAL_COMMUNITY): Payer: Self-pay

## 2013-07-14 ENCOUNTER — Ambulatory Visit (HOSPITAL_COMMUNITY)
Admission: RE | Admit: 2013-07-14 | Discharge: 2013-07-14 | Disposition: A | Discharge status: Home / Self Care | Payer: No Typology Code available for payment source | Source: Ambulatory Visit | Attending: Oncology | Admitting: Oncology

## 2013-07-14 ENCOUNTER — Other Ambulatory Visit: Payer: Self-pay

## 2013-07-14 ENCOUNTER — Telehealth: Payer: Self-pay | Admitting: *Deleted

## 2013-07-14 DIAGNOSIS — Z5309 Procedure and treatment not carried out because of other contraindication: Secondary | ICD-10-CM | POA: Insufficient documentation

## 2013-07-14 DIAGNOSIS — C541 Malignant neoplasm of endometrium: Secondary | ICD-10-CM

## 2013-07-14 DIAGNOSIS — C549 Malignant neoplasm of corpus uteri, unspecified: Secondary | ICD-10-CM | POA: Insufficient documentation

## 2013-07-14 LAB — CBC WITH DIFFERENTIAL/PLATELET
BASOS ABS: 0.1 10*3/uL (ref 0.0–0.1)
Basophils Relative: 1 % (ref 0–1)
EOS PCT: 2 % (ref 0–5)
Eosinophils Absolute: 0.2 10*3/uL (ref 0.0–0.7)
HCT: 29.3 % — ABNORMAL LOW (ref 36.0–46.0)
Hemoglobin: 9.2 g/dL — ABNORMAL LOW (ref 12.0–15.0)
LYMPHS ABS: 1.7 10*3/uL (ref 0.7–4.0)
Lymphocytes Relative: 19 % (ref 12–46)
MCH: 19.5 pg — ABNORMAL LOW (ref 26.0–34.0)
MCHC: 31.4 g/dL (ref 30.0–36.0)
MCV: 61.9 fL — AB (ref 78.0–100.0)
MONO ABS: 0.5 10*3/uL (ref 0.1–1.0)
MONOS PCT: 6 % (ref 3–12)
NEUTROS PCT: 72 % (ref 43–77)
Neutro Abs: 6.4 10*3/uL (ref 1.7–7.7)
PLATELETS: 239 10*3/uL (ref 150–400)
RBC: 4.73 MIL/uL (ref 3.87–5.11)
RDW: 18.6 % — ABNORMAL HIGH (ref 11.5–15.5)
WBC: 8.9 10*3/uL (ref 4.0–10.5)

## 2013-07-14 LAB — PROTIME-INR
INR: 1.03 (ref 0.00–1.49)
Prothrombin Time: 13.3 seconds (ref 11.6–15.2)

## 2013-07-14 LAB — BASIC METABOLIC PANEL
BUN: 5 mg/dL — ABNORMAL LOW (ref 6–23)
CO2: 24 meq/L (ref 19–32)
CREATININE: 0.44 mg/dL — AB (ref 0.50–1.10)
Calcium: 8.7 mg/dL (ref 8.4–10.5)
Chloride: 104 mEq/L (ref 96–112)
GFR calc Af Amer: >90 mL/min (ref >90)
GLUCOSE: 249 mg/dL — AB (ref 70–99)
Potassium: 4 mEq/L (ref 3.7–5.3)
Sodium: 141 mEq/L (ref 137–147)

## 2013-07-14 LAB — APTT: aPTT: 27 seconds (ref 24–37)

## 2013-07-14 MED ORDER — SODIUM CHLORIDE 0.9 % IV SOLN
INTRAVENOUS | Status: DC
  Administered 2013-07-14: 12:00:00 via INTRAVENOUS

## 2013-07-14 MED ORDER — VANCOMYCIN HCL IN DEXTROSE 1-5 GM/200ML-% IV SOLN
1000.0000 mg | Freq: Once | INTRAVENOUS | Status: DC
Start: 2013-07-14 — End: 2013-07-15

## 2013-07-14 MED ORDER — FERROUS FUMARATE 324 MG PO TABS: ORAL_TABLET | ORAL | Status: DC

## 2013-07-14 MED ORDER — VITAMIN C 500 MG PO TABS: ORAL_TABLET | ORAL | Status: AC

## 2013-07-14 NOTE — Telephone Encounter (Signed)
Scheduled for port placement today for chemo and found that she was on Brilinta.  Would like to reschedule for Friday or Monday.  Will get an order from Dr. Claiborne Billings or Erasmo Downer, PharmD

## 2013-07-14 NOTE — Progress Notes (Signed)
Patient told of Iron and Vitamin C prescriptions amd advised how to take them on empty stomach.  Patient verbalized uncerstanding.

## 2013-07-14 NOTE — Telephone Encounter (Signed)
Tiffany from IR at [facility] hospital was calling in regards to having Kelly [name] hold her Brilinta 5 days prior to her procedure. They want to do her procedure next Monday.  TK

## 2013-07-14 NOTE — Progress Notes (Signed)
Pt presented to short stay for port placement as scheduled.  Upon assessment was discovered to have taken her Brilinta last night.  Procedure cancelled per Rowe Robert, PA.  Verbal discharge order received from [place] and entered.  IV d/c'd, pt educated regarding rescheduled procedure and medications to stop, paperwork given to pt.  Pt verbalized understanding and was able to teach back all instructions.  Garry Heater, RN 07/14/2013

## 2013-07-15 ENCOUNTER — Other Ambulatory Visit: Payer: No Typology Code available for payment source

## 2013-07-15 ENCOUNTER — Encounter: Payer: Self-pay | Admitting: *Deleted

## 2013-07-15 ENCOUNTER — Other Ambulatory Visit: Payer: Self-pay | Admitting: Medical

## 2013-07-15 ENCOUNTER — Telehealth: Payer: Self-pay | Admitting: *Deleted

## 2013-07-15 ENCOUNTER — Ambulatory Visit: Payer: No Typology Code available for payment source

## 2013-07-15 LAB — GLUCOSE, CAPILLARY: Glucose-Capillary: 239 mg/dL — ABNORMAL HIGH (ref 70–99)

## 2013-07-15 MED ORDER — INSULIN GLARGINE 100 UNIT/ML SOLOSTAR PEN: 10.0000 [IU] | PEN_INJECTOR | Freq: Every day | SUBCUTANEOUS | Status: DC

## 2013-07-15 NOTE — Telephone Encounter (Signed)
Protocol for [facility] is 5 days off anti-coagulant.  Reviewed with Erasmo Downer and Dr. Claiborne Billings.  Dr. Claiborne Billings needs to call Dr. [name] today to discuss plans for pts Brilinta.  Will need today to be her last dose if surgery is to be done Monday.

## 2013-07-15 NOTE — Progress Notes (Signed)
[facility] Psychosocial Distress Screening Clinical Social Work  Clinical Social Work was referred by distress screening protocol.  The patient scored a 6 on the Psychosocial Distress Thermometer which indicates moderate distress. Clinical Social Worker has previously met with patient and will continue to follow patient to assess for distress and other psychosocial needs. CSW has provided brief counseling support, connected patient with transportation/financial resources, and is assisting patient in obtaining child care for infant.  ONCBCN DISTRESS SCREENING 07/09/2013  Screening Type Initial Screening  Mark the number that describes how much distress you have been experiencing in the past week 6  Practical problem type Housing;Insurance;Transportation;Childcare  Emotional problem type Depression;Adjusting to illness;Feeling hopeless;Adjusting to appearance changes  Physical Problem type Pain;Constipation/diarrhea  Physician notified of physical symptoms Yes    Clinical Social Worker follow up needed: yes  If yes, follow up plan:  Will follow up with patient at next chemotherapy appointment to further address concerns.   Lauren Mullis, MSW, LCSW, OSW-C Clinical Social Worker [facility] [phone]        

## 2013-07-15 NOTE — Telephone Encounter (Signed)
Placed a call to Dr. Marko [name] at 4:40pm but their office closes at 4:30pm.  Gave info to answering service asking someone to call Dr. Claiborne [name] about Kelly [name] the brilinta and surgical procedure.  Dr. Alen [name] is on call and requested he call and talk with Dr. Claiborne [name].  Patient notified that the doctors need to decide on the best was to handle the anticoagulant prior to procedure.  Asked her to hold off her AM dose tomorrow and she will here from Dr. Marko [name] or our office before noon.  Patient voiced understanding. Dr. Claiborne [name] talked with Dr. Alen [name] and he will talk with Dr. Marko [name] tomorrow to see if there is an alternative method for chemo.  Will have her take AM dose and she will hear from someone tomorrow.  Patient voiced understanding and instructed to call if she hasn't heard from anyone by lunch.

## 2013-07-16 ENCOUNTER — Telehealth: Payer: Self-pay

## 2013-07-16 ENCOUNTER — Telehealth: Payer: Self-pay | Admitting: *Deleted

## 2013-07-16 ENCOUNTER — Telehealth: Payer: Self-pay | Admitting: Family Medicine

## 2013-07-16 NOTE — Telephone Encounter (Signed)
[name] that Dr. [name] hopes to admit Ms. Kelly [name]  On 07-21-13 for [employer] Coverage and will need PAC placement 07-23-13.  Dr. [name] is waiting to hear from cardiology if they want [facility] or [facility]. Anderson Malta verbalized understanding.

## 2013-07-16 NOTE — Telephone Encounter (Signed)
Cardiac rehab order faxed to [phone]

## 2013-07-16 NOTE — Telephone Encounter (Signed)
Faxed  cardiac rehab phase II order to [facility].

## 2013-07-16 NOTE — Telephone Encounter (Signed)
Please, schedule this patient an appointment to follow up on her DM and blood sugar readings. Please call her schedule her an appointment per Chana Bode PAC. CLS

## 2013-07-17 ENCOUNTER — Telehealth: Payer: Self-pay

## 2013-07-17 ENCOUNTER — Encounter: Payer: Self-pay | Admitting: Oncology

## 2013-07-17 ENCOUNTER — Telehealth: Payer: Self-pay | Admitting: Cardiovascular Disease

## 2013-07-17 ENCOUNTER — Other Ambulatory Visit: Payer: Self-pay | Admitting: Oncology

## 2013-07-17 ENCOUNTER — Telehealth: Payer: Self-pay | Admitting: *Deleted

## 2013-07-17 DIAGNOSIS — B373 Candidiasis of vulva and vagina: Secondary | ICD-10-CM

## 2013-07-17 DIAGNOSIS — C541 Malignant neoplasm of endometrium: Secondary | ICD-10-CM

## 2013-07-17 DIAGNOSIS — B3731 Acute candidiasis of vulva and vagina: Secondary | ICD-10-CM

## 2013-07-17 NOTE — Telephone Encounter (Signed)
Left message for pt to call back and schedule an appt.

## 2013-07-17 NOTE — Progress Notes (Signed)
Medical Oncology Note of Information  Patient requires [facility] for further chemotherapy due to inadequate peripheral venous access; she had a difficult time with PICC during recent hospitalization and PICC is not optimal given care it would require, diabetes,her infant and compliance issues. She is on Brilinta since acute MI on 06-17-13, which needs to be held x 5 days prior to procedure. Due to recent MI with stent, she will need to be covered with continuous infusion Aggrastat on days 4 and 5 off Brilinta. Plan is to hold Brilinta x 3 days, then admit on day 4 to begin Aggrastat thru day 5, with PAC on day 6.  I have discussed by phone on 5-6 and again on 5-7 with Dr Claiborne Billings, including Brilinta to Aggrastat as above and plan to admit to [facility] rather than [facility] for this care due to availability of cardiology at [facility].  I have discussed with [facility] IR physician assistant. Order placed for "outpatient" PAC by IR at [facility] on 07-23-13 to have procedure spot secured. That procedure will change to inpatient after admission.  I have discussed with [facility] service as [place] physicians do not admit to [facility]. [facility] cannot do elective admission, but are glad for me to see patient at [facility] on 5-11 AM, then will accept the admission. Hospitalist Flow Manager [phone]) will give name of admitting physician that AM. I have spoken with Bed Control at [facility], as bed availability necessary. Bed Control cannot assign bed without admitting physician, but are aware of situation and can be contacted early AM on 5-11 ([phone])  I have spoken with [facility] pharmacist, who will need chemistries, CBC, PT/PTT, height and weight for dosing.  I have arranged to see patient at [facility] on 5-11 AM, with labs to be drawn at [facility] and IV to be started and locked at [facility] by IV therapy RN, all of this due to extremely difficult access and patient's concerns about previous access problems.  RN has discussed plans with patient by phone; Dr  Evette Georges office has also spoken with her about timing for holding Brilinta. Patient has transportation to office on 5-11 and care for infant while she is in hospital.  Godfrey Pick, MD

## 2013-07-17 NOTE — Progress Notes (Signed)
Patient came in with letter of denial for medicaid. I advised her to inquire about the hearing process.

## 2013-07-17 NOTE — Telephone Encounter (Signed)
Patient returned my call.  She did receive the message and voiced understanding.

## 2013-07-17 NOTE — Telephone Encounter (Signed)
Dr. [name] called requesting to speak to Dr. Claiborne Billings.  Cell number given - she will call Dr. Claiborne Billings at [facility].

## 2013-07-17 NOTE — Telephone Encounter (Signed)
Onset 2 days ago with a white cheesy discharge from vagina. Experiencing itching and some burning.  Denies foul odor. Told Kelly [name] that a vaginal cream will be called in to [place] as she does not tolerate Flagyl -N/V.

## 2013-07-17 NOTE — Telephone Encounter (Signed)
Reviewed appointments for Monday 07-21-13.  Lab at 0830 with IV start with lab draw in the flush area.  Dr. [name] will see her and then she will be admitted to [facility] for IV anticoagulation. She will stop her Brilinta on Saturday, Sunday, Monday per Dr. Evette Georges office.

## 2013-07-17 NOTE — Telephone Encounter (Signed)
LM with info Dr. Claiborne Billings sent over - last dose Brilinta Friday, admit Monday and procedure Wednesday.

## 2013-07-17 NOTE — Telephone Encounter (Signed)
I have spoken to Dr. [name]; procedure scheduled for next Wed , she will admit on Monday for 2b3a inhibition ; last dose of brilinta on Friday

## 2013-07-18 ENCOUNTER — Telehealth: Payer: Self-pay | Admitting: Medical

## 2013-07-18 MED ORDER — INSULIN SYRINGES (DISPOSABLE) U-100 1 ML MISC: Status: DC

## 2013-07-18 MED ORDER — CLOTRIMAZOLE 2 % VA CREA: 1.0000 | TOPICAL_CREAM | Freq: Every day | VAGINAL | Status: DC

## 2013-07-18 NOTE — Telephone Encounter (Signed)
Encounter Closed---5/8 TP 

## 2013-07-18 NOTE — Telephone Encounter (Signed)
[facility] pharmacy has insulin ready for patient to pick up.  It is in the refrigerator.  They will call Kelly [name] and let her know she can pick it up along with Gyne-lotrimin cream.

## 2013-07-20 ENCOUNTER — Encounter (HOSPITAL_COMMUNITY): Payer: Self-pay | Admitting: Emergency Medicine

## 2013-07-20 ENCOUNTER — Inpatient Hospital Stay (HOSPITAL_COMMUNITY)
Admission: EM | Admit: 2013-07-20 | Discharge: 2013-07-23 | DRG: 303 | Disposition: A | Discharge status: Home / Self Care | Payer: No Typology Code available for payment source | Attending: Family Medicine | Admitting: Family Medicine

## 2013-07-20 ENCOUNTER — Emergency Department (HOSPITAL_COMMUNITY): Payer: No Typology Code available for payment source

## 2013-07-20 ENCOUNTER — Other Ambulatory Visit: Payer: Self-pay | Admitting: Oncology

## 2013-07-20 DIAGNOSIS — C541 Malignant neoplasm of endometrium: Secondary | ICD-10-CM

## 2013-07-20 DIAGNOSIS — K219 Gastro-esophageal reflux disease without esophagitis: Secondary | ICD-10-CM | POA: Diagnosis present

## 2013-07-20 DIAGNOSIS — E119 Type 2 diabetes mellitus without complications: Secondary | ICD-10-CM | POA: Diagnosis present

## 2013-07-20 DIAGNOSIS — Z833 Family history of diabetes mellitus: Secondary | ICD-10-CM

## 2013-07-20 DIAGNOSIS — R079 Chest pain, unspecified: Secondary | ICD-10-CM

## 2013-07-20 DIAGNOSIS — E669 Obesity, unspecified: Secondary | ICD-10-CM | POA: Diagnosis present

## 2013-07-20 DIAGNOSIS — I213 ST elevation (STEMI) myocardial infarction of unspecified site: Secondary | ICD-10-CM

## 2013-07-20 DIAGNOSIS — D509 Iron deficiency anemia, unspecified: Secondary | ICD-10-CM | POA: Diagnosis present

## 2013-07-20 DIAGNOSIS — Z7982 Long term (current) use of aspirin: Secondary | ICD-10-CM

## 2013-07-20 DIAGNOSIS — I2510 Atherosclerotic heart disease of native coronary artery without angina pectoris: Secondary | ICD-10-CM

## 2013-07-20 DIAGNOSIS — Z881 Allergy status to other antibiotic agents status: Secondary | ICD-10-CM

## 2013-07-20 DIAGNOSIS — I10 Essential (primary) hypertension: Secondary | ICD-10-CM | POA: Diagnosis present

## 2013-07-20 DIAGNOSIS — Z6839 Body mass index (BMI) 39.0-39.9, adult: Secondary | ICD-10-CM

## 2013-07-20 DIAGNOSIS — E785 Hyperlipidemia, unspecified: Secondary | ICD-10-CM | POA: Diagnosis present

## 2013-07-20 DIAGNOSIS — I2109 ST elevation (STEMI) myocardial infarction involving other coronary artery of anterior wall: Secondary | ICD-10-CM | POA: Diagnosis present

## 2013-07-20 DIAGNOSIS — C549 Malignant neoplasm of corpus uteri, unspecified: Secondary | ICD-10-CM | POA: Diagnosis present

## 2013-07-20 DIAGNOSIS — Z8249 Family history of ischemic heart disease and other diseases of the circulatory system: Secondary | ICD-10-CM

## 2013-07-20 DIAGNOSIS — R569 Unspecified convulsions: Secondary | ICD-10-CM | POA: Diagnosis present

## 2013-07-20 DIAGNOSIS — Z794 Long term (current) use of insulin: Secondary | ICD-10-CM

## 2013-07-20 DIAGNOSIS — G40309 Generalized idiopathic epilepsy and epileptic syndromes, not intractable, without status epilepticus: Secondary | ICD-10-CM | POA: Diagnosis present

## 2013-07-20 DIAGNOSIS — Z9861 Coronary angioplasty status: Secondary | ICD-10-CM

## 2013-07-20 DIAGNOSIS — I200 Unstable angina: Secondary | ICD-10-CM | POA: Diagnosis present

## 2013-07-20 LAB — CBC WITH DIFFERENTIAL/PLATELET
BASOS PCT: 0 % (ref 0–1)
Basophils Absolute: 0 10*3/uL (ref 0.0–0.1)
EOS PCT: 2 % (ref 0–5)
Eosinophils Absolute: 0.2 10*3/uL (ref 0.0–0.7)
HEMATOCRIT: 32.7 % — AB (ref 36.0–46.0)
Hemoglobin: 10.2 g/dL — ABNORMAL LOW (ref 12.0–15.0)
LYMPHS ABS: 3 10*3/uL (ref 0.7–4.0)
Lymphocytes Relative: 24 % (ref 12–46)
MCH: 19.4 pg — AB (ref 26.0–34.0)
MCHC: 31.2 g/dL (ref 30.0–36.0)
MCV: 62.2 fL — AB (ref 78.0–100.0)
MONOS PCT: 4 % (ref 3–12)
Monocytes Absolute: 0.5 10*3/uL (ref 0.1–1.0)
Neutro Abs: 8.6 10*3/uL — ABNORMAL HIGH (ref 1.7–7.7)
Neutrophils Relative %: 70 % (ref 43–77)
Platelets: 411 10*3/uL — ABNORMAL HIGH (ref 150–400)
RBC: 5.26 MIL/uL — ABNORMAL HIGH (ref 3.87–5.11)
RDW: 20 % — ABNORMAL HIGH (ref 11.5–15.5)
WBC: 12.3 10*3/uL — AB (ref 4.0–10.5)

## 2013-07-20 LAB — COMPREHENSIVE METABOLIC PANEL
ALK PHOS: 158 U/L — AB (ref 39–117)
ALT: 14 U/L (ref 0–35)
AST: 13 U/L (ref 0–37)
Albumin: 3.3 g/dL — ABNORMAL LOW (ref 3.5–5.2)
BILIRUBIN TOTAL: 0.3 mg/dL (ref 0.3–1.2)
BUN: 8 mg/dL (ref 6–23)
CHLORIDE: 101 meq/L (ref 96–112)
CO2: 23 meq/L (ref 19–32)
Calcium: 9.3 mg/dL (ref 8.4–10.5)
Creatinine, Ser: 0.5 mg/dL (ref 0.50–1.10)
GLUCOSE: 143 mg/dL — AB (ref 70–99)
POTASSIUM: 3.8 meq/L (ref 3.7–5.3)
Sodium: 139 mEq/L (ref 137–147)
Total Protein: 8.4 g/dL — ABNORMAL HIGH (ref 6.0–8.3)

## 2013-07-20 LAB — I-STAT CHEM 8, ED
BUN: 5 mg/dL — ABNORMAL LOW (ref 6–23)
Calcium, Ion: 1.12 mmol/L (ref 1.12–1.23)
Chloride: 103 mEq/L (ref 96–112)
Creatinine, Ser: 0.6 mg/dL (ref 0.50–1.10)
Glucose, Bld: 150 mg/dL — ABNORMAL HIGH (ref 70–99)
HCT: 36 % (ref 36.0–46.0)
HEMOGLOBIN: 12.2 g/dL (ref 12.0–15.0)
Potassium: 3.8 mEq/L (ref 3.7–5.3)
SODIUM: 141 meq/L (ref 137–147)
TCO2: 22 mmol/L (ref 0–100)

## 2013-07-20 LAB — I-STAT TROPONIN, ED: Troponin i, poc: 0 ng/mL (ref 0.00–0.08)

## 2013-07-20 MED ORDER — MORPHINE SULFATE 4 MG/ML IJ SOLN
6.0000 mg | Freq: Once | INTRAMUSCULAR | Status: AC
  Administered 2013-07-20: 6 mg via INTRAVENOUS
  Filled 2013-07-20: qty 2

## 2013-07-20 MED ORDER — NITROGLYCERIN IN D5W 200-5 MCG/ML-% IV SOLN
10.0000 ug/min | INTRAVENOUS | Status: DC
  Administered 2013-07-21: 5 ug/min via INTRAVENOUS
  Filled 2013-07-20: qty 250

## 2013-07-20 MED ORDER — ASPIRIN 81 MG PO CHEW
324.0000 mg | CHEWABLE_TABLET | Freq: Once | ORAL | Status: AC
  Administered 2013-07-20: 324 mg via ORAL

## 2013-07-20 MED ORDER — IOHEXOL 350 MG/ML SOLN
80.0000 mL | Freq: Once | INTRAVENOUS | Status: AC | PRN
  Administered 2013-07-20: 65 mL via INTRAVENOUS

## 2013-07-20 MED ORDER — NITROGLYCERIN 0.4 MG SL SUBL
0.4000 mg | SUBLINGUAL_TABLET | SUBLINGUAL | Status: DC | PRN
  Administered 2013-07-20: 0.4 mg via SUBLINGUAL

## 2013-07-20 NOTE — ED Notes (Signed)
Pt to ED for evaluation of sudden onset of chest pain to left chest that started 1 hour ago- pt tearful and anxious upon arrival to ED, hx of MI in the past.  Dr. Mingo Amber at bedside.  Pt reports she is a cancer pt, currently receiving chemotherapy- pt is scheduled to have port placed tomorrow.  Pt speaking in full sentences, answering questions appropriately.  Dr. Mingo Amber at beside with ultrasound to start IV.

## 2013-07-20 NOTE — ED Notes (Signed)
Back from CT

## 2013-07-20 NOTE — ED Notes (Signed)
Pt alert, NAD, calm, interactive, no dyspnea noted. Pt to CT. family remains in room.

## 2013-07-20 NOTE — ED Notes (Addendum)
Alert, NAD, calm, interactive, reports mid chest pressure and throb under L breast. Denies sx other than pain/discomfort. Returned to monitor & O2 . Was taken off heart meds (taking ASA, but not Brilinta) Friday, was to be placed in hospital for port-a-cath Wednesday. No dyspnea noted.

## 2013-07-20 NOTE — ED Provider Notes (Signed)
CSN: [id number]     Arrival date & time 07/20/13  2210 History   First MD Initiated Contact with Patient 07/20/13 2222     Chief Complaint  Patient presents with  . Chest Pain     (Consider location/radiation/quality/duration/timing/severity/associated sxs/prior Treatment) Patient is a 38 y.o. female presenting with chest pain. The history is provided by the patient.  Chest Pain Pain location:  L chest Pain quality: sharp   Radiates to: L lateral chest. Pain radiates to the back: no   Pain severity:  Moderate Onset quality:  Sudden Duration:  45 minutes Timing:  Constant Progression:  Unchanged Chronicity:  New Context: at rest   Relieved by:  Nothing Worsened by:  Nothing tried Associated symptoms: shortness of breath   Associated symptoms: no cough and no fever     Past Medical History  Diagnosis Date  . Obesity   . GERD (gastroesophageal reflux disease)     occasional no meds   . Endometrial cancer 05/2013    [facility] FIGO2.  Dr. Nancy Marus, Dr. Gery Pray, Dr. Lavonia Drafts  . Type II or unspecified type diabetes mellitus without mention of complication, uncontrolled   . STEMI (ST elevation myocardial infarction) 06/17/13    cardiac cath with extensive thrombus and proximal third RCA 90% stenosis, drug eluting stent placed; [facility];  Dr. Shelva Majestic  . Hypertension   . Anemia     multifactoral  . Seizures     age 52yo; tonic clonic, [facility]  . Sickle cell trait   . Hyperlipidemia   . Anxiety    Past Surgical History  Procedure Laterality Date  . Abdominal hysterectomy  05/16/13    [facility], Dr. Nancy Marus  . Cardiac catheterization  06/17/13    extensive thrombus with proximal third RCA 90% stenosis, stent placed, Dr. Shelva Majestic   Family History  Problem Relation Age of Onset  . Depression Mother   . Hypertension Father   . Hypertension Sister   . Cancer Paternal Aunt     breast  . Heart disease Neg Hx   . Diabetes Sister     type 1  diabetes   History  Substance Use Topics  . Smoking status: Never Smoker   . Smokeless tobacco: Never Used  . Alcohol Use: No   OB History   Grav Para Term Preterm Abortions TAB SAB Ect Mult Living   0 0 0 0 0 0 0 0 0 0      Review of Systems  Constitutional: Negative for fever.  Respiratory: Positive for shortness of breath. Negative for cough.   Cardiovascular: Positive for chest pain. Negative for leg swelling.  All other systems reviewed and are negative.     Allergies  Amoxicillin; Flagyl; and Adhesive  Home Medications   Prior to Admission medications   Medication Sig Start Date End Date Taking? Authorizing Provider  aspirin EC 81 MG EC tablet Take 1 tablet (81 mg total) by mouth daily. 06/20/13   Kelvin Cellar, MD  atorvastatin (LIPITOR) 80 MG tablet Take 1 tablet (80 mg total) by mouth daily at 6 PM. 06/20/13   Kelvin Cellar, MD  carvedilol (COREG) 12.5 MG tablet Take 1 tablet (12.5 mg total) by mouth 2 (two) times daily. 07/04/13   Cecilie Kicks, NP  clotrimazole (GYNE-LOTRIMIN 3) 2 % vaginal cream Place 1 Applicatorful vaginally at bedtime. 07/18/13   Lennis Marion Downer, MD  docusate sodium (STOOL SOFTENER) 100 MG capsule Take 200 mg by mouth 2 (two)  times daily.  05/18/13   Historical Provider, MD  Ferrous Fumarate 324 MG TABS Take 1 tab on an empty stomach with Vit C tab. 07/14/13   Lennis P Livesay, MD  ferrous sulfate 325 (65 FE) MG tablet Take 325 mg by mouth.    Historical Provider, MD  glipiZIDE (GLUCOTROL) 5 MG tablet Take 1 tablet (5 mg total) by mouth 2 (two) times daily before a meal. 07/02/13   Carlena Hurl, PA-C  insulin aspart (NOVOLOG) 100 UNIT/ML injection Per sliding scale 07/10/13   Lennis Marion Downer, MD  Insulin Glargine (LANTUS SOLOSTAR) 100 UNIT/ML Solostar Pen Inject 10 Units into the skin daily at 10 pm. 07/15/13   Camelia Eng Tysinger, PA-C  Insulin Syringes, Disposable, U-100 1 ML MISC Use as directed with insulin 07/18/13   Lennis Marion Downer, MD  LORazepam  (ATIVAN) 1 MG tablet Place 1/2- 1 tab under tongue or swallow every 6 hr as needed for nausea.  Take 1 tab the night of chemo.  Will make drowsy 06/04/13   Gordy Levan, MD  metFORMIN (GLUCOPHAGE) 1000 MG tablet Take 1 tablet (1,000 mg total) by mouth once. 07/02/13   Camelia Eng Tysinger, PA-C  Multiple Vitamins-Minerals (MULTIVITAMIN WITH MINERALS) tablet Take 1 tablet by mouth daily.    Historical Provider, MD  nitroGLYCERIN (NITROSTAT) 0.4 MG SL tablet Place 1 tablet (0.4 mg total) under the tongue every 5 (five) minutes as needed for chest pain. 06/20/13   Kelvin Cellar, MD  OXcarbazepine (TRILEPTAL) 150 MG tablet Take 150 mg by mouth daily as needed (epilepsy).  05/18/13 05/18/14  Historical Provider, MD  oxyCODONE-acetaminophen (PERCOCET/ROXICET) 5-325 MG per tablet Take by mouth every 4 (four) hours as needed for severe pain.    Historical Provider, MD  pantoprazole (PROTONIX) 40 MG tablet Take 1 tablet (40 mg total) by mouth daily. 06/04/13   Lennis Marion Downer, MD  polyethylene glycol (MIRALAX / GLYCOLAX) packet Take 17 g by mouth daily.    Historical Provider, MD  potassium chloride SA (K-DUR,KLOR-CON) 20 MEQ tablet Take 1 tablet (20 mEq total) by mouth daily. 07/10/13   Lennis Marion Downer, MD  Ticagrelor (BRILINTA) 90 MG TABS tablet Take 1 tablet (90 mg total) by mouth 2 (two) times daily. 06/20/13   Kelvin Cellar, MD  vitamin C (ASCORBIC ACID) 500 MG tablet Take 1 tab daily with iron on an empty stomach. 07/14/13   Lennis P Livesay, MD   BP 141/101  Temp(Src) 98.3 F (36.8 C) (Oral)  Resp 32  SpO2 100%  LMP 02/24/2013 Physical Exam  Nursing note and vitals reviewed. Constitutional: She is oriented to person, place, and time. She appears well-developed and well-nourished. She appears distressed (chest pain).  HENT:  Head: Normocephalic and atraumatic.  Eyes: EOM are normal. Pupils are equal, round, and reactive to light.  Neck: Normal range of motion. Neck supple.  Cardiovascular: Regular  rhythm.  Tachycardia present.  Exam reveals no friction rub.   No murmur heard. Pulmonary/Chest: Effort normal and breath sounds normal. No respiratory distress. She has no wheezes. She has no rales.  Abdominal: Soft. She exhibits no distension. There is no tenderness. There is no rebound.  Musculoskeletal: Normal range of motion. She exhibits no edema.  Neurological: She is alert and oriented to person, place, and time.  Skin: No rash noted. She is not diaphoretic.    ED Course  Procedures (including critical care time) Labs Review Labs Reviewed  CBC WITH DIFFERENTIAL  COMPREHENSIVE METABOLIC PANEL  I-STAT TROPOININ, ED  I-STAT CHEM 8, ED    Imaging Review Ct Angio Chest Pe W/cm &/or Wo Cm  07/20/2013   CLINICAL DATA:  Shortness of breath and chest pain  EXAM: CT ANGIOGRAPHY CHEST WITH CONTRAST  TECHNIQUE: Multidetector CT imaging of the chest was performed using the standard protocol during bolus administration of intravenous contrast. Multiplanar CT image reconstructions and MIPs were obtained to evaluate the vascular anatomy.  CONTRAST:  73mL OMNIPAQUE IOHEXOL 350 MG/ML SOLN  COMPARISON:  DG CHEST 1V PORT dated 07/20/2013; CT ANGIO CHEST W/CM &/OR WO/CM dated 06/15/2013  FINDINGS: Technically limited study due to motion artifact. There is moderately good opacification of the central and proximal segmental pulmonary arteries. No definitive filling defects are demonstrated. No evidence of significant central pulmonary embolus. Peripheral pulmonary arteries are not well demonstrated and remain indeterminate.  Normal heart size. No pericardial effusion. Normal caliber thoracic aorta without evidence of dissection. Esophagus is decompressed. No significant lymphadenopathy in the chest. Visualized upper abdominal organs are grossly unremarkable. Visualization of the lungs is severely limited due to respiratory motion artifact. No gross consolidation is identified. No destructive bone lesions  appreciated.  Review of the MIP images confirms the above findings.  IMPRESSION: Limited study due to motion artifact. No evidence of significant central pulmonary embolus.   Electronically Signed   By: Lucienne Capers M.D.   On: 07/20/2013 23:40   Dg Chest Port 1 View  07/20/2013   CLINICAL DATA:  Left-sided chest pain  EXAM: PORTABLE CHEST - 1 VIEW  COMPARISON:  06/20/2013  FINDINGS: The heart size and mediastinal contours are within normal limits. Both lungs are clear. The visualized skeletal structures are unremarkable.  IMPRESSION: No active disease.   Electronically Signed   By: Inez Catalina M.D.   On: 07/20/2013 22:54     EKG Interpretation   Date/Time:  Sunday Jul 20 2013 22:14:43 EDT Ventricular Rate:  110 PR Interval:  153 QRS Duration: 75 QT Interval:  326 QTC Calculation: 441 R Axis:   25 Text Interpretation:  Age not entered, assumed to be  38 years old for  purpose of ECG interpretation Sinus tachycardia Probable left atrial  enlargement Low voltage, precordial leads Abnormal R-wave progression,  early transition Baseline wander in lead(s) I II III aVR aVL V1 V2 V3 V4  Similar to prior Confirmed by Mingo Amber  MD, [place] ([id number]) on 07/20/2013  10:26:05 PM      Angiocath insertion Performed by: Osvaldo Shipper  Consent: Verbal consent obtained. Risks and benefits: risks, benefits and alternatives were discussed Time out: Immediately prior to procedure a "time out" was called to verify the correct patient, procedure, equipment, support staff and site/side marked as required.  Preparation: Patient was prepped and draped in the usual sterile fashion.  Vein Location: L AC  Yes Ultrasound Guided  Gauge: 18  Normal blood return and flush without difficulty Patient tolerance: Patient tolerated the procedure well with no immediate complications.  CRITICAL CARE Performed by: Osvaldo Shipper   Total critical care time: 30 minutes  Critical care time was  exclusive of separately billable procedures and treating other patients.  Critical care was necessary to treat or prevent imminent or life-threatening deterioration.  Critical care was time spent personally by me on the following activities: development of treatment plan with patient and/or surrogate as well as nursing, discussions with consultants, evaluation of patient's response to treatment, examination of patient, obtaining history from patient or surrogate, ordering and performing treatments  and interventions, ordering and review of laboratory studies, ordering and review of radiographic studies, pulse oximetry and re-evaluation of patient's condition.   MDM   Final diagnoses:  Chest pain    89F with hx of endometrial cancer presents with acute onset L chest pain. Sharp, L sided, radiating around L chest. Stopped her ticagrelor for port-a-cath insertion tmw. Recent MI with stent placement one month ago.  Vitals show tachycardia here. Lungs clear. Concern for PE with hx of endometrial cancer. EKG without acute ischemic changes. CT angio ordered. Pain meds, aspirin given.  CT without PE. Sharp chest pain improving, but now having pressure chest pain. Nitroglycerin abated chest pain previous, will start NTG infusion.  I spoke with Cards, who is concerned for in-stent thrombosis since she stopped her Brilinta. She had a plan to get IV infusion of aggrestat tomorrow morning. Heparin bolus and infusion initiated. Patient admitted by Dr. Shanon Brow.   I have reviewed all labs and imaging and considered them in my medical decision making.   Osvaldo Shipper, MD 07/21/13 [phone]

## 2013-07-21 ENCOUNTER — Other Ambulatory Visit (HOSPITAL_COMMUNITY): Payer: No Typology Code available for payment source

## 2013-07-21 ENCOUNTER — Encounter (HOSPITAL_COMMUNITY): Payer: Self-pay | Admitting: *Deleted

## 2013-07-21 ENCOUNTER — Other Ambulatory Visit: Payer: No Typology Code available for payment source

## 2013-07-21 ENCOUNTER — Ambulatory Visit: Payer: No Typology Code available for payment source | Admitting: Oncology

## 2013-07-21 ENCOUNTER — Ambulatory Visit (HOSPITAL_COMMUNITY): Payer: No Typology Code available for payment source

## 2013-07-21 DIAGNOSIS — C549 Malignant neoplasm of corpus uteri, unspecified: Secondary | ICD-10-CM

## 2013-07-21 DIAGNOSIS — E119 Type 2 diabetes mellitus without complications: Secondary | ICD-10-CM

## 2013-07-21 DIAGNOSIS — R079 Chest pain, unspecified: Secondary | ICD-10-CM

## 2013-07-21 DIAGNOSIS — E1165 Type 2 diabetes mellitus with hyperglycemia: Secondary | ICD-10-CM

## 2013-07-21 DIAGNOSIS — D649 Anemia, unspecified: Secondary | ICD-10-CM

## 2013-07-21 DIAGNOSIS — IMO0001 Reserved for inherently not codable concepts without codable children: Secondary | ICD-10-CM

## 2013-07-21 DIAGNOSIS — I200 Unstable angina: Secondary | ICD-10-CM | POA: Diagnosis present

## 2013-07-21 DIAGNOSIS — I10 Essential (primary) hypertension: Secondary | ICD-10-CM

## 2013-07-21 DIAGNOSIS — I252 Old myocardial infarction: Secondary | ICD-10-CM

## 2013-07-21 LAB — CBC
HCT: 31 % — ABNORMAL LOW (ref 36.0–46.0)
HEMATOCRIT: 26.5 % — AB (ref 36.0–46.0)
HEMOGLOBIN: 9.5 g/dL — AB (ref 12.0–15.0)
Hemoglobin: 8.5 g/dL — ABNORMAL LOW (ref 12.0–15.0)
MCH: 20 pg — AB (ref 26.0–34.0)
MCH: 19.3 pg — AB (ref 26.0–34.0)
MCHC: 32.1 g/dL (ref 30.0–36.0)
MCHC: 30.6 g/dL (ref 30.0–36.0)
MCV: 62.2 fL — ABNORMAL LOW (ref 78.0–100.0)
MCV: 62.9 fL — AB (ref 78.0–100.0)
Platelets: 305 10*3/uL (ref 150–400)
Platelets: 332 10*3/uL (ref 150–400)
RBC: 4.26 MIL/uL (ref 3.87–5.11)
RBC: 4.93 MIL/uL (ref 3.87–5.11)
RDW: 19.8 % — ABNORMAL HIGH (ref 11.5–15.5)
RDW: 20 % — ABNORMAL HIGH (ref 11.5–15.5)
WBC: 10.9 10*3/uL — ABNORMAL HIGH (ref 4.0–10.5)
WBC: 10.5 10*3/uL (ref 4.0–10.5)

## 2013-07-21 LAB — GLUCOSE, CAPILLARY
GLUCOSE-CAPILLARY: 201 mg/dL — AB (ref 70–99)
Glucose-Capillary: 162 mg/dL — ABNORMAL HIGH (ref 70–99)
Glucose-Capillary: 129 mg/dL — ABNORMAL HIGH (ref 70–99)
Glucose-Capillary: 244 mg/dL — ABNORMAL HIGH (ref 70–99)
Glucose-Capillary: 170 mg/dL — ABNORMAL HIGH (ref 70–99)

## 2013-07-21 LAB — TROPONIN I
Troponin I: <0.3 ng/mL (ref <0.30)
Troponin I: <0.3 ng/mL (ref <0.30)
Troponin I: <0.3 ng/mL (ref <0.30)

## 2013-07-21 LAB — HEPARIN LEVEL (UNFRACTIONATED): HEPARIN UNFRACTIONATED: 0.57 [IU]/mL (ref 0.30–0.70)

## 2013-07-21 LAB — MRSA PCR SCREENING: MRSA BY PCR: NEGATIVE

## 2013-07-21 MED ORDER — ASPIRIN 81 MG PO CHEW: 324.0000 mg | CHEWABLE_TABLET | ORAL | Status: AC

## 2013-07-21 MED ORDER — ATORVASTATIN CALCIUM 80 MG PO TABS
80.0000 mg | ORAL_TABLET | Freq: Every day | ORAL | Status: DC
  Administered 2013-07-21 – 2013-07-23 (×3): 80 mg via ORAL
  Filled 2013-07-21 (×3): qty 1

## 2013-07-21 MED ORDER — TIROFIBAN HCL IV 12.5 MG/250 ML
0.1500 ug/kg/min | INTRAVENOUS | Status: DC
  Administered 2013-07-21 – 2013-07-23 (×5): 0.15 ug/kg/min via INTRAVENOUS
  Filled 2013-07-21 (×12): qty 250

## 2013-07-21 MED ORDER — PANTOPRAZOLE SODIUM 40 MG PO TBEC
40.0000 mg | DELAYED_RELEASE_TABLET | Freq: Every day | ORAL | Status: DC
  Administered 2013-07-21 – 2013-07-23 (×3): 40 mg via ORAL
  Filled 2013-07-21 (×3): qty 1

## 2013-07-21 MED ORDER — GLIPIZIDE 5 MG PO TABS
5.0000 mg | ORAL_TABLET | Freq: Two times a day (BID) | ORAL | Status: DC
  Administered 2013-07-21 – 2013-07-22 (×3): 5 mg via ORAL
  Filled 2013-07-21 (×5): qty 1

## 2013-07-21 MED ORDER — ASPIRIN EC 81 MG PO TBEC
81.0000 mg | DELAYED_RELEASE_TABLET | Freq: Every day | ORAL | Status: DC
  Administered 2013-07-22 – 2013-07-23 (×2): 81 mg via ORAL
  Filled 2013-07-21 (×2): qty 1

## 2013-07-21 MED ORDER — POTASSIUM CHLORIDE CRYS ER 20 MEQ PO TBCR
20.0000 meq | EXTENDED_RELEASE_TABLET | Freq: Every day | ORAL | Status: DC
  Administered 2013-07-21 – 2013-07-23 (×3): 20 meq via ORAL
  Filled 2013-07-21 (×4): qty 1

## 2013-07-21 MED ORDER — ASPIRIN EC 81 MG PO TBEC: 81.0000 mg | DELAYED_RELEASE_TABLET | Freq: Every day | ORAL | Status: DC

## 2013-07-21 MED ORDER — INSULIN ASPART 100 UNIT/ML ~~LOC~~ SOLN
0.0000 [IU] | SUBCUTANEOUS | Status: DC
  Administered 2013-07-21: 3 [IU] via SUBCUTANEOUS
  Administered 2013-07-21 (×2): 2 [IU] via SUBCUTANEOUS

## 2013-07-21 MED ORDER — OXCARBAZEPINE 150 MG PO TABS
150.0000 mg | ORAL_TABLET | Freq: Every day | ORAL | Status: DC | PRN
  Filled 2013-07-21: qty 1

## 2013-07-21 MED ORDER — CLOTRIMAZOLE 2 % VA CREA
1.0000 | TOPICAL_CREAM | Freq: Every day | VAGINAL | Status: DC
  Administered 2013-07-21 – 2013-07-22 (×2): 1 via VAGINAL
  Filled 2013-07-21: qty 22.2

## 2013-07-21 MED ORDER — INSULIN ASPART 100 UNIT/ML ~~LOC~~ SOLN
0.0000 [IU] | Freq: Three times a day (TID) | SUBCUTANEOUS | Status: DC
  Administered 2013-07-22: 3 [IU] via SUBCUTANEOUS
  Administered 2013-07-22: 2 [IU] via SUBCUTANEOUS
  Administered 2013-07-22 – 2013-07-23 (×2): 3 [IU] via SUBCUTANEOUS

## 2013-07-21 MED ORDER — NITROGLYCERIN 0.4 MG SL SUBL: 0.4000 mg | SUBLINGUAL_TABLET | SUBLINGUAL | Status: DC | PRN

## 2013-07-21 MED ORDER — ACETAMINOPHEN 325 MG PO TABS
650.0000 mg | ORAL_TABLET | Freq: Four times a day (QID) | ORAL | Status: DC | PRN
  Administered 2013-07-21: 650 mg via ORAL
  Filled 2013-07-21: qty 2

## 2013-07-21 MED ORDER — POLYETHYLENE GLYCOL 3350 17 G PO PACK
17.0000 g | PACK | Freq: Every day | ORAL | Status: DC
  Administered 2013-07-21: 17 g via ORAL
  Filled 2013-07-21 (×2): qty 1

## 2013-07-21 MED ORDER — CARVEDILOL 12.5 MG PO TABS
12.5000 mg | ORAL_TABLET | Freq: Two times a day (BID) | ORAL | Status: DC
  Administered 2013-07-21 – 2013-07-23 (×6): 12.5 mg via ORAL
  Filled 2013-07-21 (×7): qty 1

## 2013-07-21 MED ORDER — DOCUSATE SODIUM 100 MG PO CAPS
200.0000 mg | ORAL_CAPSULE | Freq: Two times a day (BID) | ORAL | Status: DC
  Administered 2013-07-21 – 2013-07-23 (×5): 200 mg via ORAL
  Filled 2013-07-21 (×6): qty 2

## 2013-07-21 MED ORDER — HEPARIN (PORCINE) IN NACL 100-0.45 UNIT/ML-% IJ SOLN
1250.0000 [IU]/h | INTRAMUSCULAR | Status: DC
  Administered 2013-07-21: 1250 [IU]/h via INTRAVENOUS
  Filled 2013-07-21 (×2): qty 250

## 2013-07-21 MED ORDER — ASPIRIN 300 MG RE SUPP
300.0000 mg | RECTAL | Status: AC
  Filled 2013-07-21: qty 1

## 2013-07-21 MED ORDER — OXYCODONE-ACETAMINOPHEN 5-325 MG PO TABS: 1.0000 | ORAL_TABLET | ORAL | Status: DC | PRN

## 2013-07-21 MED ORDER — HEPARIN BOLUS VIA INFUSION
4000.0000 [IU] | Freq: Once | INTRAVENOUS | Status: AC
  Administered 2013-07-21: 4000 [IU] via INTRAVENOUS
  Filled 2013-07-21: qty 4000

## 2013-07-21 MED ORDER — HEPARIN BOLUS VIA INFUSION: 60.0000 [IU]/kg | Freq: Once | INTRAVENOUS | Status: DC

## 2013-07-21 NOTE — Progress Notes (Addendum)
ANTICOAGULATION CONSULT NOTE - Initial Consult  Pharmacy Consult for heparin and Aggrastat Indication: chest pain/ACS  Allergies  Allergen Reactions  . Amoxicillin Nausea And Vomiting, Swelling and Rash    Rash, vomiting, throat closing Rash, vomiting, throat closing  . Flagyl [Metronidazole] Nausea And Vomiting    04/30/13-Per pt she is unable to tolerate this medication, causes rash 04/30/13-Per pt she is unable to tolerate this medication, causes rash  . Adhesive [Tape] Rash and Dermatitis    "takes skin off" "takes skin off"    Patient Measurements: Height: 5' 6.14" (168 cm) Weight: 247 lb 5.7 oz (112.2 kg) IBW/kg (Calculated) : 59.63 Heparin Dosing Weight: 90kg  Vital Signs: Temp: 98.3 F (36.8 C) (05/10 2218) Temp src: Oral (05/10 2218) BP: 104/69 mmHg (05/11 0000) Pulse Rate: 93 (05/11 0000)  Labs:  Recent Labs  07/20/13 2222 07/20/13 2232  HGB 10.2* 12.2  HCT 32.7* 36.0  PLT 411*  --   CREATININE 0.50 0.60    Estimated Creatinine Clearance: 122.5 ml/min (by C-G formula based on Cr of 0.6).   Medical History: Past Medical History  Diagnosis Date  . Obesity   . GERD (gastroesophageal reflux disease)     occasional no meds   . Endometrial cancer 05/2013    [facility] FIGO2.  Dr. Nancy Marus, Dr. Gery Pray, Dr. Lavonia Drafts  . Type II or unspecified type diabetes mellitus without mention of complication, uncontrolled   . STEMI (ST elevation myocardial infarction) 06/17/13    cardiac cath with extensive thrombus and proximal third RCA 90% stenosis, drug eluting stent placed; [facility];  Dr. Shelva Majestic  . Hypertension   . Anemia     multifactoral  . Seizures     age 22yo; tonic clonic, [facility]  . Sickle cell trait   . Hyperlipidemia   . Anxiety     Assessment: 38yo female w/ premature CAD currently receiving chemotherapy c/o sudden onset of CP, initial troponin negative, to begin heparin.  Goal of Therapy:  Heparin level 0.3-0.5  units/ml Monitor platelets by anticoagulation protocol: Yes   Plan:  Will give heparin 4000 units x1 followed by gtt at 1250 units/hr and monitor heparin levels and CBC.  Wynona Neat, PharmD, BCPS  07/21/2013,12:13 AM    ADDENDUM: Cards would like to start Aggrastat 2/2 pt stopped Brilinta on 5/9 d/t plan for port-a-cath placement Monday.  Will start Aggrastat 0.15 mcg/kg/min, reduce heparin goal, and monitor plt.    VB 07/21/2013 12:48 AM

## 2013-07-21 NOTE — Progress Notes (Addendum)
SUBJECTIVE:Kelly [name] was seen and examined at bedside this morning.  She reports complete resolution of her chest pain this morning but is complaining of arm pain from lab draws. She is hungry and asking for food. No shortness of breath.   Scheduled Meds: . aspirin  324 mg Oral NOW   Or  . aspirin  300 mg Rectal NOW  . [START ON 07/22/2013] aspirin EC  81 mg Oral Daily  . atorvastatin  80 mg Oral q1800  . carvedilol  12.5 mg Oral BID WC  . clotrimazole  1 Applicatorful Vaginal QHS  . docusate sodium  200 mg Oral BID  . glipiZIDE  5 mg Oral BID AC  . insulin aspart  0-9 Units Subcutaneous 6 times per day  . pantoprazole  40 mg Oral Daily  . polyethylene glycol  17 g Oral Daily  . potassium chloride SA  20 mEq Oral Daily   Continuous Infusions: . tirofiban 0.15 mcg/kg/min (07/21/13 0144)   PRN Meds:.acetaminophen, nitroGLYCERIN, OXcarbazepine, oxyCODONE-acetaminophen  Filed Vitals:   07/21/13 0245 07/21/13 0300 07/21/13 0330 07/21/13 0400  BP: 101/83 116/67 123/72 117/49  Pulse: 84 88 88 93  Temp:  98.6 F (37 C)    TempSrc:  Oral    Resp: 19 24 22 23   Height:      Weight:      SpO2: 100% 100% 100% 99%    Intake/Output Summary (Last 24 hours) at 07/21/13 0750 Last data filed at 07/21/13 0700  Gross per 24 hour  Intake 279.69 ml  Output      0 ml  Net 279.69 ml   LABS: Basic Metabolic Panel:  Recent Labs  07/20/13 2222 07/20/13 2232  NA 139 141  K 3.8 3.8  CL 101 103  CO2 23  --   GLUCOSE 143* 150*  BUN 8 5*  CREATININE 0.50 0.60  CALCIUM 9.3  --    Liver Function Tests:  Recent Labs  07/20/13 2222  AST 13  ALT 14  ALKPHOS 158*  BILITOT 0.3  PROT 8.4*  ALBUMIN 3.3*   CBC:  Recent Labs  07/20/13 2222 07/20/13 2232 07/21/13 0645  WBC 12.3*  --  10.9*  NEUTROABS 8.6*  --   --   HGB 10.2* 12.2 8.5*  HCT 32.7* 36.0 26.5*  MCV 62.2*  --  62.2*  PLT 411*  --  305   Cardiac Enzymes:  Recent Labs  07/21/13 0410  TROPONINI  <0.30   RADIOLOGY: Ct Angio Chest Pe W/cm &/or Wo Cm  07/20/2013   CLINICAL DATA:  Shortness of breath and chest pain  EXAM: CT ANGIOGRAPHY CHEST WITH CONTRAST  TECHNIQUE: Multidetector CT imaging of the chest was performed using the standard protocol during bolus administration of intravenous contrast. Multiplanar CT image reconstructions and MIPs were obtained to evaluate the vascular anatomy.  CONTRAST:  15mL OMNIPAQUE IOHEXOL 350 MG/ML SOLN  COMPARISON:  DG CHEST 1V PORT dated 07/20/2013; CT ANGIO CHEST W/CM &/OR WO/CM dated 06/15/2013  FINDINGS: Technically limited study due to motion artifact. There is moderately good opacification of the central and proximal segmental pulmonary arteries. No definitive filling defects are demonstrated. No evidence of significant central pulmonary embolus. Peripheral pulmonary arteries are not well demonstrated and remain indeterminate.  Normal heart size. No pericardial effusion. Normal caliber thoracic aorta without evidence of dissection. Esophagus is decompressed. No significant lymphadenopathy in the chest. Visualized upper abdominal organs are grossly unremarkable. Visualization of the lungs is severely limited due  to respiratory motion artifact. No gross consolidation is identified. No destructive bone lesions appreciated.  Review of the MIP images confirms the above findings.  IMPRESSION: Limited study due to motion artifact. No evidence of significant central pulmonary embolus.   Electronically Signed   By: Lucienne Capers M.D.   On: 07/20/2013 23:40   Dg Chest Port 1 View  07/20/2013   CLINICAL DATA:  Left-sided chest pain  EXAM: PORTABLE CHEST - 1 VIEW  COMPARISON:  06/20/2013  FINDINGS: The heart size and mediastinal contours are within normal limits. Both lungs are clear. The visualized skeletal structures are unremarkable.  IMPRESSION: No active disease.   Electronically Signed   By: Inez Catalina M.D.   On: 07/20/2013 22:54   PHYSICAL EXAM General: NAD,  lying in bed Lungs: Clear to auscultation bilaterally CV: RRR Abdomen: Soft, nontender, +bs Neurologic: Alert and oriented x 3, strength and sensation grossly intact  Psych: Normal affect. Extremities: moving all 4 extremities  ASSESSMENT AND PLAN:  Kelly [name] is a 38 year old female with hx of recent inferior MI with DES to RCA on Brilinta, DM2, HTN, Anemia, and stage 3a endometrial carcinoma admitted for recurrent chest pain.  Unstable angina--Brilinta was stopped prior to admission for planned port-a-cath placement 07/23/13, however, she was admitted overnight for recurrent chest pressure relieved with nitro. Transitioned to aggrastat and started on IV heparin and nitro gtt. First CE neg. EKG without ST elevation. No CP this morning, headache with nitro gtt.  -stop nitro gtt as CP has resolved -continue to cycle CE, first one neg and poc trop neg.  -stop heparin gtt in setting of Hb drop from 12.2 to 8.5 this morning -will advance diet at this time -continue ASA, statin, and BB -continue aggrastat  Acute on chronic anemia--hx of iron deficient anemia, on iron supplementation at home. Hx of hysterectomy. Denies any obvious bleeding at this time. Hb 10.2 and 12.2 on admission, down to 8.5 this morning. On heparin gtt, and aggrastat. Baseline Hb ~9-10 since 2015.  -fobt -recheck Hb 10am with next troponin -d/c heparin gtt for now  Case discussed and patient seen with Dr. Aundra Dubin  Signed: [name], MD PGY-2, Internal Medicine Resident Pager: [phone]  07/21/2013,8:27 AM   Patient seen with resident, agree with the above note.  She had an episode of chest pain last night.  Cardiac enzymes negative x 2 so far.  She also has anemia, with hemoglobin lower today but no overt bleeding.  1. Chest pain: Had DES in 4/15.  Had been off Brilinta for porta-a-cath placement.  Was to be admitted today for Aggrastat bridging to get a port-a-cath.  ECG is nonspecific.  Cardiac enzymes  negative x 2.  Will continue Aggrastat and cycle enzymes.  No further chest pain.  Hold heparin gtt for now with anemia.  2. Anemia: ?Etiology.  Does not have period now.  No overt GI bleeding.  Repeat hemoglobin this evening.   [name] 07/21/2013 8:18 AM

## 2013-07-21 NOTE — Progress Notes (Signed)
9:32 AM I agree with HPI/GPe and A/P per Dr. Shanon Brow  38 y/o ?, known h/o IDDM ty 2, Recurrent [place] sxz/Pseusdoseizures  09/14/2008, sickel cell trait +,  Endometrial Ca IIIa FIGO grade 2-TAH/BSO 05/16/13-On Chemotherapy [Taxol/carboplatin] s/p Surgery, who had an inferior stemi 4/6-06/17/13 treated with DES of RCA c port-a-cath planned 5/13 and stopped brilinta, after last dose Friday 07/18/13 for planned procedure 07/23/13. Was planned to have admission in the AM 07/21/13 for aggrastat bridging. However, she developed chest pressure.  She had pain/pressure for upwards of 30-45 minutes leading to presentation.  In the ER she had a negative CTA (poor study but no large central PE) and cardiology consulted given recent DES. ECG reviewed without overt ST elevation. Heparin gtt starting in the ER along with NTG gtt.    Currently cp free, no n/v/cp.  Tol diet, no gi losses from anywere nor does she have clinical bleeding [sp tah/bso].  deneis consitpation.    HEENT obese pleasant aaf, nad, no pallor, poor dentition CHEST cta b, no added sound CARDIAC s1 s2 no m/r/g ABDOMEN soft, nt, no rebound or gaurd NEURO intact SKIN/MUSCULAR ROM intact  Patient Active Problem List   Diagnosis Date Noted  . Unstable angina 07/21/2013  . CAD (coronary artery disease) s/p Inferior STEMI 06/15/13, s/p PCI +DES to mid RCA 07/07/2013  . Hypertension 07/07/2013  . STEMI (ST elevation myocardial infarction) 06/17/2013  . Shortness of breath 06/16/2013  . Chest pain 06/16/2013  . Diabetes mellitus type 2, uncontrolled 06/16/2013  . HCAP (healthcare-associated pneumonia) 06/15/2013  . Endometrial ca 05/28/2013  . ASCUS pap smear of cervix, negative HPV 04/08/2013  . Uterine mass 04/03/2013  . DIABETES MELLITUS, TYPE II 10/19/2008  . Morbid obesity 10/19/2008  . DEPRESSION 10/19/2008  . SEIZURE DISORDER 10/19/2008   Continue plan as per above-Await CE's x 3.  Would transfer to tele if can gve tirofibran gtt there.    Verneita Griffes, MD [facility] [phone]

## 2013-07-21 NOTE — ED Notes (Signed)
Pt describes event earlier tonight, "had just put baby to sleep, was lying in bed, developed sudden CP, became sob, dizzy, nauseated, diaphoretic, last ~ 1 hr, think I might have been having a panic attack".

## 2013-07-21 NOTE — Progress Notes (Deleted)
Kelly [name], is a 38 y.o. female, DOB - 03/11/1976, XHB:[id number] coming from Dr Hall Busing office, H/O endometrial cancer, MI few weeks ago had a stent placed by Dr Claiborne Billings is on [place], now needs a Porta Cath placed, per Cards stop Brelinta, IV Aggrastat per pharm then P.Cath placement on Wednesday, will needs Cards to follow.      Filed Vitals:   07/21/13 0245 07/21/13 0300 07/21/13 0330 07/21/13 0400  BP: 101/83 116/67 123/72 117/49  Pulse: 84 88 88 93  Temp:  98.6 F (37 C)    TempSrc:  Oral    Resp: 19 24 22 23   Height:      Weight:      SpO2: 100% 100% 100% 99%        Data Review   Micro Results Recent Results (from the past 240 hour(s))  MRSA PCR SCREENING     Status: None   Collection Time    07/21/13  2:25 AM      Result Value Ref Range Status   MRSA by PCR NEGATIVE  NEGATIVE Final   Comment:            The GeneXpert MRSA Assay (FDA     approved for NASAL specimens     only), is one component of a     comprehensive MRSA colonization     surveillance program. It is not     intended to diagnose MRSA     infection nor to guide or     monitor treatment for     MRSA infections.    Radiology Reports Ct Angio Chest Pe W/cm &/or Wo Cm  07/20/2013   CLINICAL DATA:  Shortness of breath and chest pain  EXAM: CT ANGIOGRAPHY CHEST WITH CONTRAST  TECHNIQUE: Multidetector CT imaging of the chest was performed using the standard protocol during bolus administration of intravenous contrast. Multiplanar CT image reconstructions and MIPs were obtained to evaluate the vascular anatomy.  CONTRAST:  20mL OMNIPAQUE IOHEXOL 350 MG/ML SOLN  COMPARISON:  DG CHEST 1V PORT dated 07/20/2013; CT ANGIO CHEST W/CM &/OR WO/CM dated 06/15/2013  FINDINGS: Technically limited study due to motion artifact. There is moderately good opacification of the central and proximal segmental pulmonary arteries. No definitive filling defects are demonstrated. No evidence of significant central  pulmonary embolus. Peripheral pulmonary arteries are not well demonstrated and remain indeterminate.  Normal heart size. No pericardial effusion. Normal caliber thoracic aorta without evidence of dissection. Esophagus is decompressed. No significant lymphadenopathy in the chest. Visualized upper abdominal organs are grossly unremarkable. Visualization of the lungs is severely limited due to respiratory motion artifact. No gross consolidation is identified. No destructive bone lesions appreciated.  Review of the MIP images confirms the above findings.  IMPRESSION: Limited study due to motion artifact. No evidence of significant central pulmonary embolus.   Electronically Signed   By: Lucienne Capers M.D.   On: 07/20/2013 23:40   Dg Chest Port 1 View  07/20/2013   CLINICAL DATA:  Left-sided chest pain  EXAM: PORTABLE CHEST - 1 VIEW  COMPARISON:  06/20/2013  FINDINGS: The heart size and mediastinal contours are within normal limits. Both lungs are clear. The visualized skeletal structures are unremarkable.  IMPRESSION: No active disease.   Electronically Signed   By: Inez Catalina M.D.   On: 07/20/2013 22:54    CBC  Recent Labs Lab 07/14/13 1215 07/20/13 2222 07/20/13 2232 07/21/13 0645  WBC 8.9 12.3*  --  10.9*  HGB 9.2* 10.2*  12.2 8.5*  HCT 29.3* 32.7* 36.0 26.5*  PLT 239 411*  --  PENDING  MCV 61.9* 62.2*  --  62.2*  MCH 19.5* 19.4*  --  20.0*  MCHC 31.4 31.2  --  32.1  RDW 18.6* 20.0*  --  19.8*  LYMPHSABS 1.7 3.0  --   --   MONOABS 0.5 0.5  --   --   EOSABS 0.2 0.2  --   --   BASOSABS 0.1 0.0  --   --     Chemistries   Recent Labs Lab 07/14/13 1215 07/20/13 2222 07/20/13 2232  NA 141 139 141  K 4.0 3.8 3.8  CL 104 101 103  CO2 24 23  --   GLUCOSE 249* 143* 150*  BUN 5* 8 5*  CREATININE 0.44* 0.50 0.60  CALCIUM 8.7 9.3  --   AST  --  13  --   ALT  --  14  --   ALKPHOS  --  158*  --   BILITOT  --  0.3  --     ------------------------------------------------------------------------------------------------------------------ estimated creatinine clearance is 122.5 ml/min (by C-G formula based on Cr of 0.6). ------------------------------------------------------------------------------------------------------------------ No results found for this basename: HGBA1C,  in the last 72 hours ------------------------------------------------------------------------------------------------------------------ No results found for this basename: CHOL, HDL, LDLCALC, TRIG, CHOLHDL, LDLDIRECT,  in the last 72 hours ------------------------------------------------------------------------------------------------------------------ No results found for this basename: TSH, T4TOTAL, FREET3, T3FREE, THYROIDAB,  in the last 72 hours ------------------------------------------------------------------------------------------------------------------ No results found for this basename: VITAMINB12, FOLATE, FERRITIN, TIBC, IRON, RETICCTPCT,  in the last 72 hours  Coagulation profile  Recent Labs Lab 07/14/13 1215  INR 1.03    No results found for this basename: DDIMER,  in the last 72 hours  Cardiac Enzymes  Recent Labs Lab 07/21/13 0410  TROPONINI <0.30   ------------------------------------------------------------------------------------------------------------------ No components found with this basename: POCBNP,

## 2013-07-21 NOTE — Consult Note (Signed)
Reason for Consult: chest pressure, recent STEMI/DES  Referring Physician: Dr. Osborn Coho Rosario-Rivera is an 38 y.o. female.  HPI: Ms. Centanni is a 38 yo woman with stage IIIa endometrial cancer who had an inferior stemi 4/6-06/17/13 treated with DES of RCA, hypertension, seizure disorder, GERD who has planned port-a-cath 5/13 and stopped brilinta, after last dose Friday 07/18/13 for planned procedure 07/23/13. She was planned to have admission in the AM 07/21/13 for aggrastat bridging. However, she developed chest pressure this evening leading to presentation. She had pain/pressure for upwards of 30-45 minutes leading to presentation. In the ER she had a negative CTA (poor study but no large central PE) and cardiology consulted given recent DES. ECG reviewed without overt ST elevation. Heparin gtt starting in the ER along with NTG gtt. She feels much improved, accompanied by her mother. We discussed plan to treat with medications, notify RN/MD of any changes of symptoms and to get ECG. Pharmacy consult for aggrastat.     Past Medical History  Diagnosis Date  . Obesity   . GERD (gastroesophageal reflux disease)     occasional no meds   . Endometrial cancer 05/2013    [facility].  Dr. Nancy Marus, Dr. Gery Pray, Dr. Lavonia Drafts  . Type II or unspecified type diabetes mellitus without mention of complication, uncontrolled   . STEMI (ST elevation myocardial infarction) 06/17/13    cardiac cath with extensive thrombus and proximal third RCA 90% stenosis, drug eluting stent placed; [facility];  Dr. Shelva Majestic  . Hypertension   . Anemia     multifactoral  . Seizures     age 38yo; tonic clonic, [facility]  . Sickle cell trait   . Hyperlipidemia   . Anxiety     Past Surgical History  Procedure Laterality Date  . Abdominal hysterectomy  05/16/13    [facility], Dr. Nancy Marus  . Cardiac catheterization  06/17/13    extensive thrombus with proximal third RCA 90% stenosis,  stent placed, Dr. Shelva Majestic    Family History  Problem Relation Age of Onset  . Depression Mother   . Hypertension Father   . Hypertension Sister   . Cancer Paternal Aunt     breast  . Heart disease Neg Hx   . Diabetes Sister     type 1 diabetes    Social History:  reports that she has never smoked. She has never used smokeless tobacco. She reports that she does not drink alcohol or use illicit drugs.  Allergies:  Allergies  Allergen Reactions  . Amoxicillin Nausea And Vomiting, Swelling and Rash    Rash, vomiting, throat closing Rash, vomiting, throat closing  . Flagyl [Metronidazole] Nausea And Vomiting    04/30/13-Per pt she is unable to tolerate this medication, causes rash 04/30/13-Per pt she is unable to tolerate this medication, causes rash  . Adhesive [Tape] Rash and Dermatitis    "takes skin off" "takes skin off"    Medications:  I have reviewed the patient's current medications. Prior to Admission:  (Not in a hospital admission) Scheduled:  Continuous: . heparin    . heparin    . nitroGLYCERIN 5 mcg/min (07/21/13 0008)    Results for orders placed during the hospital encounter of 07/20/13 (from the past 48 hour(s))  CBC WITH DIFFERENTIAL     Status: Abnormal   Collection Time    07/20/13 10:22 PM      Result Value Ref Range   WBC 12.3 (*) 4.0 -  10.5 K/uL   RBC 5.26 (*) 3.87 - 5.11 MIL/uL   Hemoglobin 10.2 (*) 12.0 - 15.0 g/dL   HCT 32.7 (*) 36.0 - 46.0 %   MCV 62.2 (*) 78.0 - 100.0 fL   MCH 19.4 (*) 26.0 - 34.0 pg   MCHC 31.2  30.0 - 36.0 g/dL   RDW 20.0 (*) 11.5 - 15.5 %   Platelets 411 (*) 150 - 400 K/uL   Comment: SPECIMEN CHECKED FOR CLOTS     REPEATED TO VERIFY     PLATELET COUNT CONFIRMED BY SMEAR   Neutrophils Relative % 70  43 - 77 %   Lymphocytes Relative 24  12 - 46 %   Monocytes Relative 4  3 - 12 %   Eosinophils Relative 2  0 - 5 %   Basophils Relative 0  0 - 1 %   Neutro Abs 8.6 (*) 1.7 - 7.7 K/uL   Lymphs Abs 3.0  0.7 - 4.0  K/uL   Monocytes Absolute 0.5  0.1 - 1.0 K/uL   Eosinophils Absolute 0.2  0.0 - 0.7 K/uL   Basophils Absolute 0.0  0.0 - 0.1 K/uL   RBC Morphology TARGET CELLS     Smear Review LARGE PLATELETS PRESENT    COMPREHENSIVE METABOLIC PANEL     Status: Abnormal   Collection Time    07/20/13 10:22 PM      Result Value Ref Range   Sodium 139  137 - 147 mEq/L   Potassium 3.8  3.7 - 5.3 mEq/L   Chloride 101  96 - 112 mEq/L   CO2 23  19 - 32 mEq/L   Glucose, Bld 143 (*) 70 - 99 mg/dL   BUN 8  6 - 23 mg/dL   Creatinine, Ser 0.50  0.50 - 1.10 mg/dL   Calcium 9.3  8.4 - 10.5 mg/dL   Total Protein 8.4 (*) 6.0 - 8.3 g/dL   Albumin 3.3 (*) 3.5 - 5.2 g/dL   AST 13  0 - 37 U/L   ALT 14  0 - 35 U/L   Alkaline Phosphatase 158 (*) 39 - 117 U/L   Total Bilirubin 0.3  0.3 - 1.2 mg/dL   GFR calc non Af Amer >90  >90 mL/min   GFR calc Af Amer >90  >90 mL/min   Comment: (NOTE)     The eGFR has been calculated using the CKD EPI equation.     This calculation has not been validated in all clinical situations.     eGFR's persistently <90 mL/min signify possible Chronic Kidney     Disease.  Randolm Idol, ED     Status: None   Collection Time    07/20/13 10:29 PM      Result Value Ref Range   Troponin i, poc 0.00  0.00 - 0.08 ng/mL   Comment 3            Comment: Due to the release kinetics of cTnI,     a negative result within the first hours     of the onset of symptoms does not rule out     myocardial infarction with certainty.     If myocardial infarction is still suspected,     repeat the test at appropriate intervals.  I-STAT CHEM 8, ED     Status: Abnormal   Collection Time    07/20/13 10:32 PM      Result Value Ref Range   Sodium 141  137 - 147 mEq/L  Potassium 3.8  3.7 - 5.3 mEq/L   Chloride 103  96 - 112 mEq/L   BUN 5 (*) 6 - 23 mg/dL   Creatinine, Ser 0.60  0.50 - 1.10 mg/dL   Glucose, Bld 150 (*) 70 - 99 mg/dL   Calcium, Ion 1.12  1.12 - 1.23 mmol/L   TCO2 22  0 - 100 mmol/L    Hemoglobin 12.2  12.0 - 15.0 g/dL   HCT 36.0  36.0 - 46.0 %    Ct Angio Chest Pe W/cm &/or Wo Cm  07/20/2013   CLINICAL DATA:  Shortness of breath and chest pain  EXAM: CT ANGIOGRAPHY CHEST WITH CONTRAST  TECHNIQUE: Multidetector CT imaging of the chest was performed using the standard protocol during bolus administration of intravenous contrast. Multiplanar CT image reconstructions and MIPs were obtained to evaluate the vascular anatomy.  CONTRAST:  53m OMNIPAQUE IOHEXOL 350 MG/ML SOLN  COMPARISON:  DG CHEST 1V PORT dated 07/20/2013; CT ANGIO CHEST W/CM &/OR WO/CM dated 06/15/2013  FINDINGS: Technically limited study due to motion artifact. There is moderately good opacification of the central and proximal segmental pulmonary arteries. No definitive filling defects are demonstrated. No evidence of significant central pulmonary embolus. Peripheral pulmonary arteries are not well demonstrated and remain indeterminate.  Normal heart size. No pericardial effusion. Normal caliber thoracic aorta without evidence of dissection. Esophagus is decompressed. No significant lymphadenopathy in the chest. Visualized upper abdominal organs are grossly unremarkable. Visualization of the lungs is severely limited due to respiratory motion artifact. No gross consolidation is identified. No destructive bone lesions appreciated.  Review of the MIP images confirms the above findings.  IMPRESSION: Limited study due to motion artifact. No evidence of significant central pulmonary embolus.   Electronically Signed   By: WLucienne CapersM.D.   On: 07/20/2013 23:40   Dg Chest Port 1 View  07/20/2013   CLINICAL DATA:  Left-sided chest pain  EXAM: PORTABLE CHEST - 1 VIEW  COMPARISON:  06/20/2013  FINDINGS: The heart size and mediastinal contours are within normal limits. Both lungs are clear. The visualized skeletal structures are unremarkable.  IMPRESSION: No active disease.   Electronically Signed   By: MInez CatalinaM.D.   On:  07/20/2013 22:54    Review of Systems  Constitutional: Positive for malaise/fatigue. Negative for fever and chills.  HENT: Negative for hearing loss and tinnitus.   Eyes: Negative for double vision and pain.  Respiratory: Negative for hemoptysis and shortness of breath.   Cardiovascular: Positive for chest pain. Negative for orthopnea.  Gastrointestinal: Negative for vomiting and abdominal pain.  Genitourinary: Negative for urgency and hematuria.  Musculoskeletal: Negative for neck pain.  Skin: Negative for rash.  Neurological: Positive for dizziness. Negative for sensory change and speech change.  Endo/Heme/Allergies: Negative for polydipsia.  Psychiatric/Behavioral: Negative for suicidal ideas, hallucinations and substance abuse.   Blood pressure 104/69, pulse 93, temperature 98.3 F (36.8 C), temperature source Oral, resp. rate 22, height 5' 6.14" (1.68 m), weight 112.2 kg (247 lb 5.7 oz), last menstrual period 02/24/2013, SpO2 100.00%. Physical Exam  Nursing note and vitals reviewed. Constitutional: She is oriented to person, place, and time. She appears well-developed and well-nourished. No distress.  HENT:  Head: Normocephalic and atraumatic.  Nose: Nose normal.  Mouth/Throat: Oropharynx is clear and moist. No oropharyngeal exudate.  Eyes: Conjunctivae and EOM are normal. Pupils are equal, round, and reactive to light. No scleral icterus.  Neck: Normal range of motion. Neck supple. No JVD present. No  tracheal deviation present.  Cardiovascular: Regular rhythm, normal heart sounds and intact distal pulses.  Exam reveals no gallop.   No murmur heard. tachycardic  Respiratory: Effort normal and breath sounds normal. No respiratory distress. She has no wheezes. She has no rales.  GI: Soft. Bowel sounds are normal. She exhibits no distension. There is no tenderness. There is no rebound.  Musculoskeletal: Normal range of motion. She exhibits no edema and no tenderness.    Neurological: She is alert and oriented to person, place, and time. No cranial nerve deficit. Coordination normal.  Skin: Skin is warm and dry. No rash noted. She is not diaphoretic. No erythema.  Psychiatric: She has a normal mood and affect. Her behavior is normal. Thought content normal.   Labs reviewed; wbc 12.3, h/h 10.2/32.7, plt 411, na 139, K 3.8, bun/cr 8/0.5, glucose 143 CTA: negative for central PE Trop 0.00 Chest x-ray: no acute process/edema EKG: nl axis, tachycardia, no overt ST elevation Echo 4/15: EF 60%, mild LVH  Problem List Chest Pressure Endometrial cancer Recent inferior STEMI and DES Coronary artery disease  T2DM, Seizure disorder  Obesity   Assessment/Plan: Ms. Goltz is a 38 yo woman with stage IIIa endometrial cancer, CAD with DES to RCA 4/15, T2DM with last dose brilina 07/18/13 with plans to start aggrastat in AM for bridge to porta-cath. Differential diagnosis for symptoms is instent thrombosis, pulmonary embolism, heart failure, atypical CP/musculoskeletal among other etiologies.  Her symptoms could be multifactorial. She does not have ST elevation currently. She is starting on heparin gtt. We will speak with pharmacy about dosing aggrastat and based on symptoms, will decide if reasonable to continue versus restart brilinta. Defer cath lab unless she rules in for MI, symptoms cannot be controlled or ECG changes.  - telemetry bed - continue home medications: aspirin 81 mg daily, coreg 6.25 mg bid, atorvastatin 80 mg qHS - heparin gtt, aggrastat pharmacy consult - discussed with pharmacy  - continue daily aspirin    Jules Husbands 07/21/2013, 12:16 AM

## 2013-07-21 NOTE — Progress Notes (Signed)
TIROFIBAN CONSULT NOTE - Follow Up Consult  Pharmacy Consult for Tirofiban gtt Indication: bridging while Ticagrelor PO interrupted in setting of recent stent placement  Allergies  Allergen Reactions  . Amoxicillin Nausea And Vomiting, Swelling and Rash    Rash, vomiting, throat closing Rash, vomiting, throat closing  . Flagyl [Metronidazole] Nausea And Vomiting    04/30/13-Per pt she is unable to tolerate this medication, causes rash 04/30/13-Per pt she is unable to tolerate this medication, causes rash  . Adhesive [Tape] Rash and Dermatitis    "takes skin off" "takes skin off"    Patient Measurements: Height: 5' 6.14" (168 cm) Weight: 247 lb 5.7 oz (112.2 kg) IBW/kg (Calculated) : 59.63  Vital Signs: Temp: 97.9 F (36.6 C) (05/11 1155) Temp src: Oral (05/11 1155) BP: 121/62 mmHg (05/11 1155) Pulse Rate: 93 (05/11 0400)  Labs:  Recent Labs  07/20/13 2222 07/20/13 2232 07/21/13 0410 07/21/13 0645 07/21/13 0829 07/21/13 1000  HGB 10.2* 12.2  --  8.5*  --  9.5*  HCT 32.7* 36.0  --  26.5*  --  31.0*  PLT 411*  --   --  305  --  332  HEPARINUNFRC  --   --   --  0.57  --   --   CREATININE 0.50 0.60  --   --   --   --   TROPONINI  --   --  <0.30  --  <0.30  --     Estimated Creatinine Clearance: 122.5 ml/min (by C-G formula based on Cr of 0.6).   Medications:  Scheduled:  . aspirin  324 mg Oral NOW   Or  . aspirin  300 mg Rectal NOW  . [START ON 07/22/2013] aspirin EC  81 mg Oral Daily  . atorvastatin  80 mg Oral q1800  . carvedilol  12.5 mg Oral BID WC  . clotrimazole  1 Applicatorful Vaginal QHS  . docusate sodium  200 mg Oral BID  . glipiZIDE  5 mg Oral BID AC  . insulin aspart  0-9 Units Subcutaneous 6 times per day  . pantoprazole  40 mg Oral Daily  . polyethylene glycol  17 g Oral Daily  . potassium chloride SA  20 mEq Oral Daily   Infusions:  . tirofiban 0.15 mcg/kg/min (07/21/13 0800)    Assessment: 38 yo F currently receiving chemotherapy for  diagnosis of endometrial carcinoma.  After just one cycle of chemo, patient suffered from acute MI and is now s/p cath and stent ~1 month ago.  She was scheduled for a port-a-cath placement on 5/13 for which Ticagrelor PO was being held, and now presents with c/o sudden onset of CP.  Pharmacy was consulted for heparin IV gtt and Tirofiban gtt, but heparin was discontinued this AM d/t concerns for anemia.    Pharmacy consulted to continue Tirofiban gtt.  Hgb had trended down overnight from 12.2 to 8.5 this AM.  A repeat CBC shows improvement of Hgb to 9.5.  Plt have dropped but are still wnl.  Initial SCr is 0.6 with CrCl >120.  No reports of bleeding issues noted.  Goal of Therapy:  Continued patency of recent stent Monitor platelets by anticoagulation protocol: Yes   Plan:  - continue tirofiban IV gtt at 0.15 mcg/kg/min - daily CBC - f/u plans for port-a-cath placement and restart of Ticagrelor PO - monitor for s/s of bleeding  Ovid Curd E. Jacqlyn Larsen, PharmD Clinical Pharmacist - Resident Pager: [phone] Pharmacy: [phone] 07/21/2013 1:01 PM

## 2013-07-21 NOTE — ED Notes (Signed)
Dr. Claiborne Billings (card fellow) at [facility], pending bed assignment.

## 2013-07-21 NOTE — H&P (Signed)
PCP:   [name], PA-C   Chief Complaint:  cp  HPI: 38 yo female h/o stage 3 uterine cancer (recieved one round of chemo right before easter, then had ami), stemi last month s/p stent, dm, htn, sz disorder comes in with waxing/waning sscp no radiation.  Similar to her heart attach last month but much less severe.  She has been holding her aspirin/brilinta since Friday in order to prepare to get a port a cath on Wednesday so she can start her chemo treatments again.  Her cardiologist and oncologist have arranged for her to be admitted tomorrow to watch her until she gets her port placed on Wednesday.  But she started having chest pressure today.  She took ntg sl she had at home and it helped.  No n/v.  No le edema/swelling.  Sob since her ami last month but nothing worse.  Pressure no better or worse with eating or deep inspiration.  No fevers.  No cough.    Review of Systems:  Positive and negative as per HPI otherwise all other systems are negative  Past Medical History: Past Medical History  Diagnosis Date  . Obesity   . GERD (gastroesophageal reflux disease)     occasional no meds   . Endometrial cancer 05/2013    [facility] FIGO2.  Dr. Nancy Marus, Dr. Gery Pray, Dr. Lavonia Drafts  . Type II or unspecified type diabetes mellitus without mention of complication, uncontrolled   . STEMI (ST elevation myocardial infarction) 06/17/13    cardiac cath with extensive thrombus and proximal third RCA 90% stenosis, drug eluting stent placed; [facility];  Dr. Shelva Majestic  . Hypertension   . Anemia     multifactoral  . Seizures     age 59yo; tonic clonic, [facility]  . Sickle cell trait   . Hyperlipidemia   . Anxiety    Past Surgical History  Procedure Laterality Date  . Abdominal hysterectomy  05/16/13    [facility], Dr. Nancy Marus  . Cardiac catheterization  06/17/13    extensive thrombus with proximal third RCA 90% stenosis, stent placed, Dr. Shelva Majestic     Medications: Prior to Admission medications   Medication Sig Start Date End Date Taking? Authorizing Provider  aspirin EC 81 MG EC tablet Take 1 tablet (81 mg total) by mouth daily. 06/20/13   Kelvin Cellar, MD  atorvastatin (LIPITOR) 80 MG tablet Take 1 tablet (80 mg total) by mouth daily at 6 PM. 06/20/13   Kelvin Cellar, MD  carvedilol (COREG) 12.5 MG tablet Take 1 tablet (12.5 mg total) by mouth 2 (two) times daily. 07/04/13   Cecilie Kicks, NP  clotrimazole (GYNE-LOTRIMIN 3) 2 % vaginal cream Place 1 Applicatorful vaginally at bedtime. 07/18/13   Lennis Marion Downer, MD  docusate sodium (STOOL SOFTENER) 100 MG capsule Take 200 mg by mouth 2 (two) times daily.  05/18/13   Historical Provider, MD  Ferrous Fumarate 324 MG TABS Take 1 tab on an empty stomach with Vit C tab. 07/14/13   Lennis P Livesay, MD  ferrous sulfate 325 (65 FE) MG tablet Take 325 mg by mouth.    Historical Provider, MD  glipiZIDE (GLUCOTROL) 5 MG tablet Take 1 tablet (5 mg total) by mouth 2 (two) times daily before a meal. 07/02/13   Carlena Hurl, PA-C  insulin aspart (NOVOLOG) 100 UNIT/ML injection Per sliding scale 07/10/13   Lennis Marion Downer, MD  Insulin Glargine (LANTUS SOLOSTAR) 100 UNIT/ML Solostar Pen Inject 10  Units into the skin daily at 10 pm. 07/15/13   Camelia Eng Tysinger, PA-C  Insulin Syringes, Disposable, U-100 1 ML MISC Use as directed with insulin 07/18/13   Lennis Marion Downer, MD  LORazepam (ATIVAN) 1 MG tablet Place 1/2- 1 tab under tongue or swallow every 6 hr as needed for nausea.  Take 1 tab the night of chemo.  Will make drowsy 06/04/13   Gordy Levan, MD  metFORMIN (GLUCOPHAGE) 1000 MG tablet Take 1 tablet (1,000 mg total) by mouth once. 07/02/13   Camelia Eng Tysinger, PA-C  Multiple Vitamins-Minerals (MULTIVITAMIN WITH MINERALS) tablet Take 1 tablet by mouth daily.    Historical Provider, MD  nitroGLYCERIN (NITROSTAT) 0.4 MG SL tablet Place 1 tablet (0.4 mg total) under the tongue every 5 (five) minutes as  needed for chest pain. 06/20/13   Kelvin Cellar, MD  OXcarbazepine (TRILEPTAL) 150 MG tablet Take 150 mg by mouth daily as needed (epilepsy).  05/18/13 05/18/14  Historical Provider, MD  oxyCODONE-acetaminophen (PERCOCET/ROXICET) 5-325 MG per tablet Take by mouth every 4 (four) hours as needed for severe pain.    Historical Provider, MD  pantoprazole (PROTONIX) 40 MG tablet Take 1 tablet (40 mg total) by mouth daily. 06/04/13   Lennis Marion Downer, MD  polyethylene glycol (MIRALAX / GLYCOLAX) packet Take 17 g by mouth daily.    Historical Provider, MD  potassium chloride SA (K-DUR,KLOR-CON) 20 MEQ tablet Take 1 tablet (20 mEq total) by mouth daily. 07/10/13   Lennis Marion Downer, MD  Ticagrelor (BRILINTA) 90 MG TABS tablet Take 1 tablet (90 mg total) by mouth 2 (two) times daily. 06/20/13   Kelvin Cellar, MD  vitamin C (ASCORBIC ACID) 500 MG tablet Take 1 tab daily with iron on an empty stomach. 07/14/13   Lennis Marion Downer, MD    Allergies:   Allergies  Allergen Reactions  . Amoxicillin Nausea And Vomiting, Swelling and Rash    Rash, vomiting, throat closing Rash, vomiting, throat closing  . Flagyl [Metronidazole] Nausea And Vomiting    04/30/13-Per pt she is unable to tolerate this medication, causes rash 04/30/13-Per pt she is unable to tolerate this medication, causes rash  . Adhesive [Tape] Rash and Dermatitis    "takes skin off" "takes skin off"    Social History:  reports that she has never smoked. She has never used smokeless tobacco. She reports that she does not drink alcohol or use illicit drugs.  Family History: Family History  Problem Relation Age of Onset  . Depression Mother   . Hypertension Father   . Hypertension Sister   . Cancer Paternal Aunt     breast  . Heart disease Neg Hx   . Diabetes Sister     type 1 diabetes    Physical Exam: Filed Vitals:   07/20/13 2300 07/20/13 2330 07/20/13 2339 07/21/13 0000  BP: 105/64 107/72 107/72 104/69  Pulse: 93  90 93  Temp:       TempSrc:      Resp: 17  18 22   Height:    5' 6.14" (1.68 m)  Weight:    112.2 kg (247 lb 5.7 oz)  SpO2: 100%  97% 100%   General appearance: alert, cooperative and no distress Head: Normocephalic, without obvious abnormality, atraumatic Eyes: negative Nose: Nares normal. Septum midline. Mucosa normal. No drainage or sinus tenderness. Neck: no JVD and supple, symmetrical, trachea midline Lungs: clear to auscultation bilaterally Heart: regular rate and rhythm, S1, S2 normal, no murmur, click, rub  or gallop Abdomen: soft, non-tender; bowel sounds normal; no masses,  no organomegaly Extremities: extremities normal, atraumatic, no cyanosis or edema Pulses: 2+ and symmetric Skin: Skin color, texture, turgor normal. No rashes or lesions Neurologic: Grossly normal  Labs on Admission:   Recent Labs  07/20/13 2222 07/20/13 2232  NA 139 141  K 3.8 3.8  CL 101 103  CO2 23  --   GLUCOSE 143* 150*  BUN 8 5*  CREATININE 0.50 0.60  CALCIUM 9.3  --     Recent Labs  07/20/13 2222  AST 13  ALT 14  ALKPHOS 158*  BILITOT 0.3  PROT 8.4*  ALBUMIN 3.3*    Recent Labs  07/20/13 2222 07/20/13 2232  WBC 12.3*  --   NEUTROABS 8.6*  --   HGB 10.2* 12.2  HCT 32.7* 36.0  MCV 62.2*  --   PLT 411*  --    Radiological Exams on Admission: Ct Angio Chest Pe W/cm &/or Wo Cm  07/20/2013   CLINICAL DATA:  Shortness of breath and chest pain  EXAM: CT ANGIOGRAPHY CHEST WITH CONTRAST  TECHNIQUE: Multidetector CT imaging of the chest was performed using the standard protocol during bolus administration of intravenous contrast. Multiplanar CT image reconstructions and MIPs were obtained to evaluate the vascular anatomy.  CONTRAST:  57mL OMNIPAQUE IOHEXOL 350 MG/ML SOLN  COMPARISON:  DG CHEST 1V PORT dated 07/20/2013; CT ANGIO CHEST W/CM &/OR WO/CM dated 06/15/2013  FINDINGS: Technically limited study due to motion artifact. There is moderately good opacification of the central and proximal segmental  pulmonary arteries. No definitive filling defects are demonstrated. No evidence of significant central pulmonary embolus. Peripheral pulmonary arteries are not well demonstrated and remain indeterminate.  Normal heart size. No pericardial effusion. Normal caliber thoracic aorta without evidence of dissection. Esophagus is decompressed. No significant lymphadenopathy in the chest. Visualized upper abdominal organs are grossly unremarkable. Visualization of the lungs is severely limited due to respiratory motion artifact. No gross consolidation is identified. No destructive bone lesions appreciated.  Review of the MIP images confirms the above findings.  IMPRESSION: Limited study due to motion artifact. No evidence of significant central pulmonary embolus.   Electronically Signed   By: Lucienne Capers M.D.   On: 07/20/2013 23:40   Dg Chest Port 1 View  07/20/2013   CLINICAL DATA:  Left-sided chest pain  EXAM: PORTABLE CHEST - 1 VIEW  COMPARISON:  06/20/2013  FINDINGS: The heart size and mediastinal contours are within normal limits. Both lungs are clear. The visualized skeletal structures are unremarkable.  IMPRESSION: No active disease.   Electronically Signed   By: Inez Catalina M.D.   On: 07/20/2013 22:54    Assessment/Plan  38 yo female with recent stemi/stent h/o uterine cancer stage 3 comes in with [place] off her antiplatelet tx for past 2 days in order to get port a cath placement this week  Principal Problem:   Unstable angina-  Hep gtt.  ntg gtt.  Cardiology consulted and will see soon.  Keep npo for now  Active Problems:stable unless o/w noted   DIABETES MELLITUS, TYPE II  ssi   SEIZURE DISORDER   Endometrial ca-  Will need to call her oncologist in am, port placement may have to be delayes   CAD (coronary artery disease) s/p Inferior STEMI 06/15/13, s/p PCI +DES to mid RCA   Hypertension  FULL CODE  Eames Dibiasio A Jamen Loiseau 07/21/2013, 12:35 AM

## 2013-07-21 NOTE — Progress Notes (Signed)
Utilization Review Completed.  

## 2013-07-21 NOTE — Progress Notes (Signed)
07/21/2013, 6:07 PM  Hospital day 1 Antibiotics: none Chemotherapy: cycle 1 carboplatin taxol 06-11-13. Chemo held since then due to acute MI 06-17-13.  [place], [address] [place], [address] [place], [place].  Patient seen on cardiac unit, admitted last pm with chest pain.  No family here presently. We had planned elective admission today to bridge with Tirofiban infusion while Brilinta held in order to have portacath placed, which is planned for 07-23-13 if possible.  Oncology History Patient had long history of irregular, heavy vaginal bleeding and pelvic pain, evaluated at various locations since 2000. She was seen in [facility] ED 03-27-13 for increased pain and heavy vaginal bleeding, with CT AP showing large mass involving endometrium 12.5 x 8.0x5.2 cm and apparent left hydrosalpinx. PAP 04-03-13 had atypical squamous cells, negative high risk HPV; endometrial biopsy showed complex hyperplasia with atypia and florid papillary proliferation, ER, p53 and p16 positive. She saw Dr [name] on 04-30-13 and had robotic assisted total laparoscopic hysterectomy/BSO/bilateral pelvic and para aortic node evalustion at [facility] 05-16-2013. Pathology from [facility] [phone]) had endometrioid adenocarcinoma FIGO grade 2, invading 2 cm into myometrium where wall was 2.5 cm thick, no serosal involvement, lower uterine segment involvement present, cervical involvement present 72m depth, involvement of left fallopian tube and right ovary,lymphovascular space invasion present and extensive, all nodes negative (8 right pelvic, 6 left pelvic, 5 right periaortic, 2 left periaortic). Stains were negative for serous component. [facility], described as screaming and shaking by family, for which she was seen by neurology and medication changed to trileptal.  Recommendation was 6 cycles of adjuvant carboplatin taxol chemotherapy given in split course with  radiation. [facility] with necessary steroid premedication. She did not monitor blood sugars as instructed after that chemotherapy. LE pain began ~ day 3 consistent with taxol aches. She developed central chest pressure and SOB with left arm pain on day 5, [facility]. Tho initially there was not indication of cardiac etiology, she had acute STEMI on 06-17-13, with cath and RCA stent by Dr KClaiborne Billings   Peripheral IV access is extremely difficult such that PAC is needed to continue chemotherapy.     Subjective: Acute onset of stabbing left lateral chest pain after lying down last pm, then progression of the pain across left chest to become central pressure. The discomfort resolved with interventions in ED. She has had no pain today and has felt fairly well other than tired after up thru night. No SOB or other respiratory symptoms. No other pain. Not much appetite, no N/V. No bowel movement today. No bleeding.  Objective: Vital signs in last 24 hours: Blood pressure 128/94, pulse 93, temperature 98.3 F (36.8 C), temperature source Oral, resp. rate 28, height 5' 6.14" (1.68 m), weight 247 lb 5.7 oz (112.2 kg), last menstrual period 02/24/2013, SpO2 100.00%.   Intake/Output from previous day: 05/10 0701 - 05/11 0700 In: 279.7 [I.V.:279.7] Out: -  Intake/Output this shift: Total I/O In: 1300.8 [P.O.:1080; I.V.:220.8] Out: -   Physical exam: sitting up in bed, looks comfortable, respirations not labored RA. Alert, pleasant and cooperative. Alopecia. PERRL, not icteric. Oral mucosa moist and clear. Lungs clear. Heart RRR. Saline lock right antecubital and IV left antecubital. Abdomen soft, some BS, nontender. Extensive bruising right groin from cath resolved. LE no edema, cords, tenderness. Neuro: Speech fluent and appropriate. Moves extremities easily, o/w nonfocal.  Psych: appropriate mood and affect.  Lab Results:  Recent  Labs  07/21/13 0645 07/21/13 1000  WBC 10.9* 10.5  HGB 8.5* 9.5*  HCT 26.5* 31.0*  PLT 305 332   CBCs apparently drawn from the right antecubital access, not sure how accurate these results are  BMET  Recent Labs  07/20/13 2222 07/20/13 2232  NA 139 141  K 3.8 3.8  CL 101 103  CO2 23  --   GLUCOSE 143* 150*  BUN 8 5*  CREATININE 0.50 0.60  CALCIUM 9.3  --    Troponins <0.30 times 3 since admission      Studies/Results: Ct Angio Chest Pe W/cm &/or Wo Cm  07/20/2013   CLINICAL DATA:  Shortness of breath and chest pain  EXAM: CT ANGIOGRAPHY CHEST WITH CONTRAST  TECHNIQUE: Multidetector CT imaging of the chest was performed using the standard protocol during bolus administration of intravenous contrast. Multiplanar CT image reconstructions and MIPs were obtained to evaluate the vascular anatomy.  CONTRAST:  6m OMNIPAQUE IOHEXOL 350 MG/ML SOLN  COMPARISON:  DG CHEST 1V PORT dated 07/20/2013; CT ANGIO CHEST W/CM &/OR WO/CM dated 06/15/2013  FINDINGS: Technically limited study due to motion artifact. There is moderately good opacification of the central and proximal segmental pulmonary arteries. No definitive filling defects are demonstrated. No evidence of significant central pulmonary embolus. Peripheral pulmonary arteries are not well demonstrated and remain indeterminate.  Normal heart size. No pericardial effusion. Normal caliber thoracic aorta without evidence of dissection. Esophagus is decompressed. No significant lymphadenopathy in the chest. Visualized upper abdominal organs are grossly unremarkable. Visualization of the lungs is severely limited due to respiratory motion artifact. No gross consolidation is identified. No destructive bone lesions appreciated.  Review of the MIP images confirms the above findings.  IMPRESSION: Limited study due to motion artifact. No evidence of significant central pulmonary embolus.   Electronically Signed   By: WLucienne CapersM.D.   On:  07/20/2013 23:40   Dg Chest Port 1 View  07/20/2013   CLINICAL DATA:  Left-sided chest pain  EXAM: PORTABLE CHEST - 1 VIEW  COMPARISON:  06/20/2013  FINDINGS: The heart size and mediastinal contours are within normal limits. Both lungs are clear. The visualized skeletal structures are unremarkable.  IMPRESSION: No active disease.   Electronically Signed   By: MInez CatalinaM.D.   On: 07/20/2013 22:54   Previous CT angio chest last admission 06-15-13 had called minimal nonspecific nodular opacification over medial LLL lung; motion artifact on present CT but nothing noted in that area  Assessment/Plan: 1. Chest pain: no acute cardiac findings since admission, post acute inferior STEMI 06-17-13 with RCA thrombectomy and stent. Brilinta held after pm dose on 07-18-13, now [facility]  2.Endometrial carcinoma IIIA FIGO 2, [facility] and one cycle of taxol carboplatin given 06-11-13. Plan to resume chemotherapy outpatient after PAC placed. 3.Diabetes: poorly controlled prior to MI. Blood sugars much better since last discharge, without insulin (which she could not afford initially). She does have glucometer now. 4.inadequate peripheral IV access for chemotherapy. PICC was attempted last admission, tolerated very poorly and not optimal with other situation. For PAC by IR on 07-23-13 if ok with cardiology then. 5.multifactorial anemia: may need IV iron after PAC access available 6.social stress with recent death of husband and infant adopted daughter. 7.presumed GERD on presentation with endometrial cancer 8.obesity tho intentional weight loss >100 lbs in past 3  years 9.sickle trait found on recent hemoglobin electrophoresis 10. History of seizure disorder since age 47, seen by neurology at [facility] after gyn oncology surgery. Question of pseudo seizures in EMR note 09-1998.  If ok with primary service and cardiology, would confirm with IR tomorrow that [facility]  placement can happen on 07-23-13 as previously planned.  Will follow.  Gordy Levan MD [phone] Pager [phone]

## 2013-07-22 ENCOUNTER — Telehealth: Payer: Self-pay

## 2013-07-22 ENCOUNTER — Other Ambulatory Visit: Payer: Self-pay | Admitting: Oncology

## 2013-07-22 ENCOUNTER — Telehealth: Payer: Self-pay | Admitting: Oncology

## 2013-07-22 DIAGNOSIS — E119 Type 2 diabetes mellitus without complications: Secondary | ICD-10-CM

## 2013-07-22 DIAGNOSIS — C541 Malignant neoplasm of endometrium: Secondary | ICD-10-CM

## 2013-07-22 DIAGNOSIS — I2510 Atherosclerotic heart disease of native coronary artery without angina pectoris: Secondary | ICD-10-CM

## 2013-07-22 LAB — BASIC METABOLIC PANEL
BUN: 8 mg/dL (ref 6–23)
CHLORIDE: 101 meq/L (ref 96–112)
CO2: 24 mEq/L (ref 19–32)
CREATININE: 0.47 mg/dL — AB (ref 0.50–1.10)
Calcium: 8.6 mg/dL (ref 8.4–10.5)
GFR calc Af Amer: >90 mL/min (ref >90)
GFR calc non Af Amer: >90 mL/min (ref >90)
Glucose, Bld: 229 mg/dL — ABNORMAL HIGH (ref 70–99)
Potassium: 3.9 mEq/L (ref 3.7–5.3)
SODIUM: 138 meq/L (ref 137–147)

## 2013-07-22 LAB — CBC
HCT: 29 % — ABNORMAL LOW (ref 36.0–46.0)
Hemoglobin: 9 g/dL — ABNORMAL LOW (ref 12.0–15.0)
MCH: 19.5 pg — ABNORMAL LOW (ref 26.0–34.0)
MCHC: 31 g/dL (ref 30.0–36.0)
MCV: 62.9 fL — AB (ref 78.0–100.0)
PLATELETS: 287 10*3/uL (ref 150–400)
RBC: 4.61 MIL/uL (ref 3.87–5.11)
RDW: 20 % — ABNORMAL HIGH (ref 11.5–15.5)
WBC: 9 10*3/uL (ref 4.0–10.5)

## 2013-07-22 LAB — GLUCOSE, CAPILLARY
GLUCOSE-CAPILLARY: 171 mg/dL — AB (ref 70–99)
GLUCOSE-CAPILLARY: 247 mg/dL — AB (ref 70–99)
Glucose-Capillary: 221 mg/dL — ABNORMAL HIGH (ref 70–99)
Glucose-Capillary: 176 mg/dL — ABNORMAL HIGH (ref 70–99)

## 2013-07-22 MED ORDER — INSULIN GLARGINE 100 UNIT/ML ~~LOC~~ SOLN
5.0000 [IU] | Freq: Every day | SUBCUTANEOUS | Status: DC
  Administered 2013-07-22: 5 [IU] via SUBCUTANEOUS
  Filled 2013-07-22 (×2): qty 0.05

## 2013-07-22 MED ORDER — ONDANSETRON HCL 8 MG PO TABS: 8.0000 mg | ORAL_TABLET | Freq: Three times a day (TID) | ORAL | Status: AC | PRN

## 2013-07-22 MED ORDER — LIDOCAINE-PRILOCAINE 2.5-2.5 % EX CREA: TOPICAL_CREAM | CUTANEOUS | Status: DC

## 2013-07-22 MED ORDER — METFORMIN HCL 500 MG PO TABS: 1000.0000 mg | ORAL_TABLET | Freq: Once | ORAL | Status: DC

## 2013-07-22 MED ORDER — VANCOMYCIN HCL 10 G IV SOLR
1500.0000 mg | INTRAVENOUS | Status: AC
  Administered 2013-07-23: 1500 mg via INTRAVENOUS
  Filled 2013-07-22: qty 1500

## 2013-07-22 MED ORDER — POLYETHYLENE GLYCOL 3350 17 G PO PACK
34.0000 g | PACK | Freq: Two times a day (BID) | ORAL | Status: DC
  Administered 2013-07-22 (×2): 34 g via ORAL
  Filled 2013-07-22 (×3): qty 2

## 2013-07-22 MED ORDER — PROCHLORPERAZINE MALEATE 10 MG PO TABS: 10.0000 mg | ORAL_TABLET | Freq: Four times a day (QID) | ORAL | Status: AC | PRN

## 2013-07-22 MED ORDER — METFORMIN HCL 500 MG PO TABS
1000.0000 mg | ORAL_TABLET | Freq: Once | ORAL | Status: DC
  Filled 2013-07-22: qty 2

## 2013-07-22 NOTE — Progress Notes (Signed)
SUBJECTIVE:Kelly [name] was seen and examined at bedside this morning.  No more chest pain since admission. No shortness of breath. Constipated.   Scheduled Meds: . aspirin EC  81 mg Oral Daily  . atorvastatin  80 mg Oral q1800  . carvedilol  12.5 mg Oral BID WC  . clotrimazole  1 Applicatorful Vaginal QHS  . docusate sodium  200 mg Oral BID  . glipiZIDE  5 mg Oral BID AC  . insulin aspart  0-9 Units Subcutaneous TID WC  . pantoprazole  40 mg Oral Daily  . polyethylene glycol  17 g Oral Daily  . potassium chloride SA  20 mEq Oral Daily   Continuous Infusions: . tirofiban 0.15 mcg/kg/min (07/22/13 0140)   PRN Meds:.acetaminophen, nitroGLYCERIN, OXcarbazepine, oxyCODONE-acetaminophen  Filed Vitals:   07/21/13 2358 07/22/13 0000 07/22/13 0449 07/22/13 0752  BP: 128/89 128/89 104/74 135/92  Pulse: 88  83   Temp: 98.2 F (36.8 C)  97 F (36.1 C)   TempSrc: Oral  Oral   Resp: 18  14   Height:      Weight:      SpO2: 99%  99%     Intake/Output Summary (Last 24 hours) at 07/22/13 0758 Last data filed at 07/22/13 0700  Gross per 24 hour  Intake 1583.55 ml  Output      0 ml  Net 1583.55 ml   LABS: Basic Metabolic Panel:  Recent Labs  07/20/13 2222 07/20/13 2232  NA 139 141  K 3.8 3.8  CL 101 103  CO2 23  --   GLUCOSE 143* 150*  BUN 8 5*  CREATININE 0.50 0.60  CALCIUM 9.3  --    Liver Function Tests:  Recent Labs  07/20/13 2222  AST 13  ALT 14  ALKPHOS 158*  BILITOT 0.3  PROT 8.4*  ALBUMIN 3.3*   CBC:  Recent Labs  07/20/13 2222  07/21/13 0645 07/21/13 1000  WBC 12.3*  --  10.9* 10.5  NEUTROABS 8.6*  --   --   --   HGB 10.2*  < > 8.5* 9.5*  HCT 32.7*  < > 26.5* 31.0*  MCV 62.2*  --  62.2* 62.9*  PLT 411*  --  305 332  < > = values in this interval not displayed. Cardiac Enzymes:  Recent Labs  07/21/13 0410 07/21/13 0829 07/21/13 1429  TROPONINI <0.30 <0.30 <0.30   RADIOLOGY: Ct Angio Chest Pe W/cm &/or Wo Cm  07/20/2013    CLINICAL DATA:  Shortness of breath and chest pain  EXAM: CT ANGIOGRAPHY CHEST WITH CONTRAST  TECHNIQUE: Multidetector CT imaging of the chest was performed using the standard protocol during bolus administration of intravenous contrast. Multiplanar CT image reconstructions and MIPs were obtained to evaluate the vascular anatomy.  CONTRAST:  26mL OMNIPAQUE IOHEXOL 350 MG/ML SOLN  COMPARISON:  DG CHEST 1V PORT dated 07/20/2013; CT ANGIO CHEST W/CM &/OR WO/CM dated 06/15/2013  FINDINGS: Technically limited study due to motion artifact. There is moderately good opacification of the central and proximal segmental pulmonary arteries. No definitive filling defects are demonstrated. No evidence of significant central pulmonary embolus. Peripheral pulmonary arteries are not well demonstrated and remain indeterminate.  Normal heart size. No pericardial effusion. Normal caliber thoracic aorta without evidence of dissection. Esophagus is decompressed. No significant lymphadenopathy in the chest. Visualized upper abdominal organs are grossly unremarkable. Visualization of the lungs is severely limited due to respiratory motion artifact. No gross consolidation is identified. No destructive bone lesions  appreciated.  Review of the MIP images confirms the above findings.  IMPRESSION: Limited study due to motion artifact. No evidence of significant central pulmonary embolus.   Electronically Signed   By: Lucienne Capers M.D.   On: 07/20/2013 23:40   Dg Chest Port 1 View  07/20/2013   CLINICAL DATA:  Left-sided chest pain  EXAM: PORTABLE CHEST - 1 VIEW  COMPARISON:  06/20/2013  FINDINGS: The heart size and mediastinal contours are within normal limits. Both lungs are clear. The visualized skeletal structures are unremarkable.  IMPRESSION: No active disease.   Electronically Signed   By: Inez Catalina M.D.   On: 07/20/2013 22:54   PHYSICAL EXAM General: NAD, sitting up in bed Lungs: Clear to auscultation bilaterally CV:  RRR Abdomen: Soft, nontender, +bs Neurologic: Alert and oriented x 3, strength and sensation grossly intact  Psych: Normal affect. Extremities: moving all 4 extremities  ASSESSMENT AND PLAN:  Kelly [name] is a 38 year old female with hx of recent inferior MI with DES to RCA on Brilinta, DM2, HTN, Anemia, and stage 3a endometrial carcinoma admitted for recurrent chest pain.  Unstable angina--Brilinta was stopped prior to admission for planned port-a-cath placement 07/23/13. Transitioned to aggrastat this admission. Off IV heparin and nitro.  Cardiac enzymes x3 negative.  No more CP.  -continue ASA, statin, and BB -continue aggrastat  Acute on chronic anemia--hx of iron deficient anemia, on iron supplementation at home. Hx of hysterectomy. Denies any obvious bleeding at this time. Hb 10.2 and 12.2 on admission, down to 8.5 yesterday morning. On heparin gtt, and aggrastat. Baseline Hb ~9-10 since 2015.  -fobt not collected -repeat Hb was 9.5 yesterday morning. Refused AM lab draws today  Endometrial carcinoma 3A--followd by Dr. [name]. Needs PAC.  -okay for [facility] tomorrow from cardiology standpoint -onc will discuss with IR  Case discussed and patient seen with Dr. Aundra Dubin  Signed: [name], MD PGY-2, Internal Medicine Resident Pager: [phone]  07/22/2013,7:58 AM  Patient seen with resident, agree with the above note.  Cardiac enzymes negative, suspect her chest pain was noncardiac.  Hemoglobin was back up to 9.5 yesterday, not sure why she had one low reading.  Will repeat CBC tomorrow.  She will be on Aggrastat until her port-a-cath placement tomorrow.  After port-a-cath placement, will need to resume Brilinta.  Would reload after port-a-cath and can then go home in the evening tomorrow.   [name] 07/22/2013 10:03 AM

## 2013-07-22 NOTE — Progress Notes (Signed)
Pt refused to have  lab drawn this morning due to being a difficult stick. Was able to draw yesterday from the gauge #18 on the Rt. AC however this time it has no blood return though it flushes just fine. Will inform MD in the AM. For possible PAC placement on 5/13 per MD note.

## 2013-07-22 NOTE — Telephone Encounter (Signed)
Message copied by Baruch Merl on Tue Jul 22, 2013  1:51 PM ------      Message from: Gordy Levan      Created: Tue Jul 22, 2013 10:26 AM       Needs EMLA, zofran 8 mg q 8 hr prn nausea "will not make drowsy" #30 [place] and compazine 10 mg q 6 hr prn nausea #20 to [place]. Please have pharmacist confirm these ok with seizure med and other meds. Generics fine.      Please let pt know these are ready for her to pick up before next chemo.      She is still in [facility], will likely be DC after [facility] tomorrow            thanks       ------

## 2013-07-22 NOTE — Progress Notes (Signed)
TIROFIBAN CONSULT NOTE - Follow Up Consult  Pharmacy Consult for Tirofiban gtt Indication: bridging while Ticagrelor PO interrupted in setting of recent stent placement  Allergies  Allergen Reactions  . Amoxicillin Nausea And Vomiting, Swelling and Rash    Rash, vomiting, throat closing Rash, vomiting, throat closing  . Flagyl [Metronidazole] Nausea And Vomiting    04/30/13-Per pt she is unable to tolerate this medication, causes rash 04/30/13-Per pt she is unable to tolerate this medication, causes rash  . Adhesive [Tape] Rash and Dermatitis    "takes skin off" "takes skin off"    Patient Measurements: Height: 5' 6.14" (168 cm) Weight: 247 lb 5.7 oz (112.2 kg) IBW/kg (Calculated) : 59.63  Vital Signs: Temp: 97.9 F (36.6 C) (05/12 0752) Temp src: Oral (05/12 0752) BP: 135/92 mmHg (05/12 0752) Pulse Rate: 83 (05/12 0449)  Labs:  Recent Labs  07/20/13 2222 07/20/13 2232 07/21/13 0410 07/21/13 0645 07/21/13 0829 07/21/13 1000 07/21/13 1429  HGB 10.2* 12.2  --  8.5*  --  9.5*  --   HCT 32.7* 36.0  --  26.5*  --  31.0*  --   PLT 411*  --   --  305  --  332  --   HEPARINUNFRC  --   --   --  0.57  --   --   --   CREATININE 0.50 0.60  --   --   --   --   --   TROPONINI  --   --  <0.30  --  <0.30  --  <0.30    Estimated Creatinine Clearance: 122.5 ml/min (by C-G formula based on Cr of 0.6).   Medications:  Scheduled:  . aspirin EC  81 mg Oral Daily  . atorvastatin  80 mg Oral q1800  . carvedilol  12.5 mg Oral BID WC  . clotrimazole  1 Applicatorful Vaginal QHS  . docusate sodium  200 mg Oral BID  . glipiZIDE  5 mg Oral BID AC  . insulin aspart  0-9 Units Subcutaneous TID WC  . pantoprazole  40 mg Oral Daily  . polyethylene glycol  34 g Oral BID  . potassium chloride SA  20 mEq Oral Daily   Infusions:  . tirofiban 0.15 mcg/kg/min (07/22/13 0140)    Assessment: 38 yo F currently receiving chemotherapy for diagnosis of endometrial carcinoma.  After just one  cycle of chemo (taxol/carboplatin), patient suffered from acute MI and is now s/p cath and stent ~1 month ago.  She was scheduled for a port-a-cath placement on 5/13 for which Ticagrelor PO was being held, and now presents with c/o sudden onset of CP.  Pharmacy was consulted for heparin IV gtt and Tirofiban gtt, but heparin was discontinued d/t concerns for anemia.    Pharmacy still consulted to continue Tirofiban gtt.  H/H low but trending up this AM.  Plt are wnl.  Initial SCr is 0.6 with CrCl >120, no new BMET (d/t difficult stick and patient refusal).  No reports of bleeding issues noted.  Goal of Therapy:  Continued patency of recent stent Monitor platelets by anticoagulation protocol: Yes   Plan:  - continue tirofiban IV gtt at 0.15 mcg/kg/min - daily CBC - f/u plans for port-a-cath placement on 5/13 and restart of Ticagrelor PO - monitor for s/s of bleeding  Ovid Curd E. Jacqlyn Larsen, PharmD Clinical Pharmacist - Resident Pager: [phone] Pharmacy: [phone] 07/22/2013 9:31 AM

## 2013-07-22 NOTE — Telephone Encounter (Signed)
Spoke with Jonni Sanger at [facility] and gave prescriptions as noted below.  He will review her meds and call back to Dr. Mariana Kaufman nurse if there are any contraindications.

## 2013-07-22 NOTE — Progress Notes (Signed)
Note: This document was prepared with digital dictation and possible smart phrase technology. Any transcriptional errors that result from this process are unintentionalShallen [name] DXA:[id number] DOB: 23-Feb-1976 DOA: 07/20/2013 PCP: Kelly Oxford, PA-C  Brief narrative:  38 y/o ?, known h/o IDDM ty 2, Recurrent [place] sxz/Pseusdoseizures  09/14/2008, sickel cell trait +,  Endometrial Ca IIIa FIGO grade 2-TAH/BSO 05/16/13-On Chemotherapy [Taxol/carboplatin] s/p Surgery, who had an inferior stemi 4/6-06/17/13 treated with DES of RCA c port-a-cath planned 5/13 and stopped brilinta, after last dose Friday 07/18/13 for planned procedure 07/23/13. Was planned to have admission in the AM 07/21/13 for aggrastat bridging. However, she developed chest pressure.  She had pain/pressure for upwards of 30-45 minutes leading to presentation.  In the ER she had a negative CTA (poor study but no large central PE) and cardiology consulted given recent DES. ECG reviewed without overt ST elevation. Heparin gtt starting in the ER along with NTG gtt.  She was seen by cardiolgoy and ACS ruled out and placed on Tirofibran gtt for anticipated procedure  Past medical history-As per Problem list Chart reviewed as below- reviewed  Consultants:  Oncology  Cardiology  Procedures:  None yet  Antibiotics:  none   Subjective   Well.  No CP, sob, n,v,fever or chills requesting Miralax BID asis her home schedule Doesn't;t drink much water   Objective    Interim History: none  Telemetry: NSR   Objective: Filed Vitals:   07/21/13 2358 07/22/13 0000 07/22/13 0449 07/22/13 0752  BP: 128/89 128/89 104/74 135/92  Pulse: 88  83   Temp: 98.2 F (36.8 C)  97 F (36.1 C) 97.9 F (36.6 C)  TempSrc: Oral  Oral Oral  Resp: 18  14 21   Height:      Weight:      SpO2: 99%  99% 100%    Intake/Output Summary (Last 24 hours) at 07/22/13 0918 Last data filed at 07/22/13 0800  Gross per 24  hour  Intake 1184.6 ml  Output      0 ml  Net 1184.6 ml    Exam:  General: alert pleasant oriented in NAD.  Tol diet Cardiovascular: s1 s2 no m/r/g Respiratory: clear, no added sound Abdomen: soft nt,nd Skin nad Neuro intact  Data Reviewed: Basic Metabolic Panel:  Recent Labs Lab 07/20/13 2222 07/20/13 2232  NA 139 141  K 3.8 3.8  CL 101 103  CO2 23  --   GLUCOSE 143* 150*  BUN 8 5*  CREATININE 0.50 0.60  CALCIUM 9.3  --    Liver Function Tests:  Recent Labs Lab 07/20/13 2222  AST 13  ALT 14  ALKPHOS 158*  BILITOT 0.3  PROT 8.4*  ALBUMIN 3.3*   No results found for this basename: LIPASE, AMYLASE,  in the last 168 hours No results found for this basename: AMMONIA,  in the last 168 hours CBC:  Recent Labs Lab 07/20/13 2222 07/20/13 2232 07/21/13 0645 07/21/13 1000  WBC 12.3*  --  10.9* 10.5  NEUTROABS 8.6*  --   --   --   HGB 10.2* 12.2 8.5* 9.5*  HCT 32.7* 36.0 26.5* 31.0*  MCV 62.2*  --  62.2* 62.9*  PLT 411*  --  305 332   Cardiac Enzymes:  Recent Labs Lab 07/21/13 0410 07/21/13 0829 07/21/13 1429  TROPONINI <0.30 <0.30 <0.30   BNP: No components found with this basename: POCBNP,  CBG:  Recent Labs Lab 07/21/13 0758 07/21/13 1155 07/21/13 1629  07/21/13 2054 07/22/13 0850  GLUCAP 129* 244* 170* 201* 171*    Recent Results (from the past 240 hour(s))  MRSA PCR SCREENING     Status: None   Collection Time    07/21/13  2:25 AM      Result Value Ref Range Status   MRSA by PCR NEGATIVE  NEGATIVE Final   Comment:            The GeneXpert MRSA Assay (FDA     approved for NASAL specimens     only), is one component of a     comprehensive MRSA colonization     surveillance program. It is not     intended to diagnose MRSA     infection nor to guide or     monitor treatment for     MRSA infections.     Studies:              All Imaging reviewed and is as per above notation   Scheduled Meds: . aspirin EC  81 mg Oral Daily    . atorvastatin  80 mg Oral q1800  . carvedilol  12.5 mg Oral BID WC  . clotrimazole  1 Applicatorful Vaginal QHS  . docusate sodium  200 mg Oral BID  . glipiZIDE  5 mg Oral BID AC  . insulin aspart  0-9 Units Subcutaneous TID WC  . pantoprazole  40 mg Oral Daily  . polyethylene glycol  17 g Oral Daily  . potassium chloride SA  20 mEq Oral Daily   Continuous Infusions: . tirofiban 0.15 mcg/kg/min (07/22/13 0140)     Assessment/Plan: 1. ROMI in setting of recent NSTEMI 06/16/13-ACS ruled out.  Transfer Tele 2. IDDM ty 2-CBG 171-244.  continue TID ac coverage.  Hold glipizide.  Add Lantus 5 U tonight and would only Rx lantus at this dose until [facility] placement 5/13.  Home dose 10 U Lantus.  Note is on Glipizide as well as metformin [restarted for hospital use] in addition to insulin.  Will need some clarification and streamlining per PCP 3. Endometrial Ca IIIa FIGO grade 2-TAH/BSO 05/16/13-On Chemotherapy [Taxol/carboplatin] s/p Surgery-stable.  For Port placement 5/13-rest per Oncology 4. Sickle trait-stable 5. H/io Seizure disorder-stable-continue Oxycarbazepine 150 qhs 6. GERD-Continue Protonix 40 daily 7. Microcytic anemia-Likely Iron def with malignancy component.  Resatrted Ferrous sulphate and Vit C 8. HLD-continue Lipitor 80 daily   Code Status: Full Family Communication:  None bedside Disposition Plan:  Inpatient-Tx to tele   Kelly Griffes, MD  [facility] Pager [phone] 07/22/2013, 9:18 AM    LOS: 2 days

## 2013-07-22 NOTE — Progress Notes (Signed)
Pt inquiring how to place her specific food order - contacted nutrition and provided pt with the appropriate phone number and advised of the process.    Pt also states she does not want her blood drawn since she'd been a difficult stick in the past and is scheduled for a portacath sometime tomorrow.    Pt currently sitting up in bed, watching tv - no c/o pain or discomfort and states "I'm feeling much better".  Pt did request additional stool softners and miralax because she has not had a BM since Sunday and she states that that is not her baseline.  MD aware.  Pt also advised that she has received transfer orders for a telemetry bed - educated that we're unsure when a bed will become available but that she will be updated as soon as we've been notified.  No questions or concerns at this time.  Will continue to monitor.

## 2013-07-22 NOTE — Progress Notes (Signed)
07/22/2013, 10:02 AM  Hospital day 3 Antibiotics: none Chemotherapy: cycle 1 carboplatin taxol 06-11-13. Chemo held since that treatment due to acute MI 06-17-13.  [place], [address], [place], [address] [place], [place].  Patient seen, with sister and patient's infant daughter in room. Discussed on unit with Dr Aundra Dubin and I have spoken directly with [facility] IR now to confirm [facility] tomorrow.  Subjective: Has done well overnight, with no chest pain, pressure or SOB. IV lock in RUE no longer draws, peripheral stick in left hand this AM with CBC and BMET in progress now. She has been up to BR without difficulty. Some confusion with dietary this AM but able to eat. No BM yet, has had meds now. No noted bleeding.  For transfer out of this unit today per cardiology. Per Dr Aundra Dubin, as long as no problems with PAC she will be reloaded with Brilinta after procedure and should be ok for DC home then, to continue outpatient Brilinta.  Objective: Vital signs in last 24 hours: Blood pressure 135/92, pulse 83, temperature 97.9 F (36.6 C), temperature source Oral, resp. rate 21, height 5' 6.14" (1.68 m), weight 247 lb 5.7 oz (112.2 kg), last menstrual period 02/24/2013, SpO2 100.00%.   Intake/Output from previous day: 05/11 0701 - 05/12 0700 In: 1583.6 [P.O.:1080; I.V.:503.6] Out: -  Intake/Output this shift: Total I/O In: 20.2 [I.V.:20.2] Out: -   Physical exam: awake, alert, comfortable sitting up in bed. Respirations not labored RA. Lungs clear. Heart RRR, NSR on monitor. Abdomen quiet, not tender, No extremity swelling. IV infusing left antecubital. Lock in right antecubital, site ok. Peripheral blood draw done from left hand this AM. Neuro nonfocal, no seizure activity since admission. PSYCH appropriate mood and affect.  Lab Results:  Recent Labs  07/21/13 0645 07/21/13 1000  WBC 10.9* 10.5  HGB 8.5* 9.5*  HCT 26.5* 31.0*  PLT 305 332  CBCs above drawn from  right antecubital NSL, which possibly accounted for discrepancies. CBC in progress this AM, drawn peripherally BMET  Recent Labs  07/20/13 2222 07/20/13 2232  NA 139 141  K 3.8 3.8  CL 101 103  CO2 23  --   GLUCOSE 143* 150*  BUN 8 5*  CREATININE 0.50 0.60  CALCIUM 9.3  --    BMET in progress this AM, drawn peripherally Studies/Results: Ct Angio Chest Pe W/cm &/or Wo Cm  07/20/2013   CLINICAL DATA:  Shortness of breath and chest pain  EXAM: CT ANGIOGRAPHY CHEST WITH CONTRAST  TECHNIQUE: Multidetector CT imaging of the chest was performed using the standard protocol during bolus administration of intravenous contrast. Multiplanar CT image reconstructions and MIPs were obtained to evaluate the vascular anatomy.  CONTRAST:  75mL OMNIPAQUE IOHEXOL 350 MG/ML SOLN  COMPARISON:  DG CHEST 1V PORT dated 07/20/2013; CT ANGIO CHEST W/CM &/OR WO/CM dated 06/15/2013  FINDINGS: Technically limited study due to motion artifact. There is moderately good opacification of the central and proximal segmental pulmonary arteries. No definitive filling defects are demonstrated. No evidence of significant central pulmonary embolus. Peripheral pulmonary arteries are not well demonstrated and remain indeterminate.  Normal heart size. No pericardial effusion. Normal caliber thoracic aorta without evidence of dissection. Esophagus is decompressed. No significant lymphadenopathy in the chest. Visualized upper abdominal organs are grossly unremarkable. Visualization of the lungs is severely limited due to respiratory motion artifact. No gross consolidation is identified. No destructive bone lesions appreciated.  Review of the MIP images confirms the above findings.  IMPRESSION: Limited study  due to motion artifact. No evidence of significant central pulmonary embolus.   Electronically Signed   By: Lucienne Capers M.D.   On: 07/20/2013 23:40   Dg Chest Port 1 View  07/20/2013   CLINICAL DATA:  Left-sided chest pain  EXAM:  PORTABLE CHEST - 1 VIEW  COMPARISON:  06/20/2013  FINDINGS: The heart size and mediastinal contours are within normal limits. Both lungs are clear. The visualized skeletal structures are unremarkable.  IMPRESSION: No active disease.   Electronically Signed   By: Inez Catalina M.D.   On: 07/20/2013 22:54     Assessment/Plan: 1.Chest pain: no acute cardiac findings since admission, off heparin qtt with lower hgb yesterday and off NTG qtt.  She is post acute inferior STEMI 06-17-13 with RCA thrombectomy and stent by Dr Claiborne Billings then. Brilinta held after pm dose on 07-18-13, now on tirofiban infusion to bridge prior to [facility]. Tirofiban to be held per pharmacy and IR at appropriate time prior to [facility] placement tomorrow. She will be reloaded with Brilinta per cardiology after [facility] placement. 2.Endometrial carcinoma IIIA FIGO 2, post surgery by Dr [name] at [facility] 05-16-13 and one cycle of taxol carboplatin given 06-11-13. Plan to resume chemotherapy outpatient after PAC placed.  3.Diabetes: poorly controlled prior to MI. Blood sugars much better since last discharge, without insulin (which she could not afford initially). She does have glucometer now. I believe that she now has regular insulin which we plan to use as SS coverage after chemo only for now. PCP Dr Redmond School has assisted with recent diabetes management outpatient. 4.inadequate peripheral IV access for chemotherapy. PICC was attempted last admission, tolerated very poorly and not optimal with rest of situation. For PAC by IR on 07-23-13 if ok with cardiology then.  5.multifactorial anemia: may need IV iron after PAC access available  6.social stress with recent death of husband and infant daughter who was adopted a month before husband died. [facility] staff has arranged patient assistance funds at [place], I believe Medicaid application begun, and [place] is involved to arrange child care during chemo and transportation  if needed. 7.presumed GERD on presentation with endometrial cancer. ? If this was actually cardiac. 8.obesity tho intentional weight loss >100 lbs in past 3 years  9.sickle trait found on recent hemoglobin electrophoresis  10. History of seizure disorder since age 8, seen by neurology at [facility] after gyn oncology surgery. Question of pseudo seizures in EMR note 09-1998.  Appreciate assistance from cardiology, hospitalist service and IR this admission  Gordy Levan MD Pager [phone]

## 2013-07-22 NOTE — Telephone Encounter (Signed)
Rescheduled appointment with Dr. [name] from 07-31-13 to 08-01-13 at 1345 as Ms. Rosario-Riveria needs to bring her daughter to doctor for shots in the am 07-31-13 and may run late to visit with Dr. [name]. Left message in patient's vm with new appt. Date and tim.  Suggested she apply the EMLA cream to [facility] site 2 hrs prior to vist as her labs canbe drawn through the [facility].

## 2013-07-22 NOTE — Telephone Encounter (Signed)
per pof pt in Hosp/sch 5/27 appt w/[facility]-sent email to MW to sch trmt-will call pt after disch poss 5/13 per pof

## 2013-07-22 NOTE — Progress Notes (Signed)
Pt updated of transfer to 2W and that we would prefer to wait until after lunch - pt agreed.  Report called to Belenda Cruise, RN on 2W - coordinated wheelchair transfer with RN tech and confirmed ok with pt.    Pt has no c/o pain or discomfort at this time - no acute issues upon transfer.

## 2013-07-22 NOTE — Progress Notes (Signed)
Patient ID: Kelly [name], female   DOB: 11-Apr-1975, 38 y.o.   MRN: [id number] Request received for port a cath placement for chemotherapy in pt with history of stage III A endometrial carcinoma, and poor venous access. Pt also with  hx of STEMI with RCA thrombectomy and stent on 06/17/13, previously on brilinta and now on aggrastat infusion. Additional PMH as below. Exam: pt awake/alert; chest- CTA bilat; heart- sl tachy but regular, abd- soft,obese,+BS,NT; ext- FROM,no edema.   Filed Vitals:   07/21/13 2358 07/22/13 0000 07/22/13 0449 07/22/13 0752  BP: 128/89 128/89 104/74 135/92  Pulse: 88  83   Temp: 98.2 F (36.8 C)  97 F (36.1 C) 97.9 F (36.6 C)  TempSrc: Oral  Oral Oral  Resp: 18  14 21   Height:      Weight:      SpO2: 99%  99% 100%   Past Medical History  Diagnosis Date  . Obesity   . GERD (gastroesophageal reflux disease)     occasional no meds   . Endometrial cancer 05/2013    [facility].  Dr. Nancy Marus, Dr. Gery Pray, Dr. Lavonia Drafts  . Type II or unspecified type diabetes mellitus without mention of complication, uncontrolled   . STEMI (ST elevation myocardial infarction) 06/17/13    cardiac cath with extensive thrombus and proximal third RCA 90% stenosis, drug eluting stent placed; [facility];  Dr. Shelva Majestic  . Hypertension   . Anemia     multifactoral  . Seizures     age 75yo; tonic clonic, [facility]  . Sickle cell trait   . Hyperlipidemia   . Anxiety    Past Surgical History  Procedure Laterality Date  . Abdominal hysterectomy  05/16/13    [facility], Dr. Nancy Marus  . Cardiac catheterization  06/17/13    extensive thrombus with proximal third RCA 90% stenosis, stent placed, Dr. Shelva Majestic   Ct Angio Chest Pe W/cm &/or Wo Cm  07/20/2013   CLINICAL DATA:  Shortness of breath and chest pain  EXAM: CT ANGIOGRAPHY CHEST WITH CONTRAST  TECHNIQUE: Multidetector CT imaging of the chest was performed using the standard protocol during bolus  administration of intravenous contrast. Multiplanar CT image reconstructions and MIPs were obtained to evaluate the vascular anatomy.  CONTRAST:  25mL OMNIPAQUE IOHEXOL 350 MG/ML SOLN  COMPARISON:  DG CHEST 1V PORT dated 07/20/2013; CT ANGIO CHEST W/CM &/OR WO/CM dated 06/15/2013  FINDINGS: Technically limited study due to motion artifact. There is moderately good opacification of the central and proximal segmental pulmonary arteries. No definitive filling defects are demonstrated. No evidence of significant central pulmonary embolus. Peripheral pulmonary arteries are not well demonstrated and remain indeterminate.  Normal heart size. No pericardial effusion. Normal caliber thoracic aorta without evidence of dissection. Esophagus is decompressed. No significant lymphadenopathy in the chest. Visualized upper abdominal organs are grossly unremarkable. Visualization of the lungs is severely limited due to respiratory motion artifact. No gross consolidation is identified. No destructive bone lesions appreciated.  Review of the MIP images confirms the above findings.  IMPRESSION: Limited study due to motion artifact. No evidence of significant central pulmonary embolus.   Electronically Signed   By: Lucienne Capers M.D.   On: 07/20/2013 23:40   Dg Chest Port 1 View  07/20/2013   CLINICAL DATA:  Left-sided chest pain  EXAM: PORTABLE CHEST - 1 VIEW  COMPARISON:  06/20/2013  FINDINGS: The heart size and mediastinal contours are within normal limits. Both lungs are clear.  The visualized skeletal structures are unremarkable.  IMPRESSION: No active disease.   Electronically Signed   By: Inez Catalina M.D.   On: 07/20/2013 22:54  Results for orders placed during the hospital encounter of 07/20/13  MRSA PCR SCREENING      Result Value Ref Range   MRSA by PCR NEGATIVE  NEGATIVE  CBC WITH DIFFERENTIAL      Result Value Ref Range   WBC 12.3 (*) 4.0 - 10.5 K/uL   RBC 5.26 (*) 3.87 - 5.11 MIL/uL   Hemoglobin 10.2 (*) 12.0 -  15.0 g/dL   HCT 32.7 (*) 36.0 - 46.0 %   MCV 62.2 (*) 78.0 - 100.0 fL   MCH 19.4 (*) 26.0 - 34.0 pg   MCHC 31.2  30.0 - 36.0 g/dL   RDW 20.0 (*) 11.5 - 15.5 %   Platelets 411 (*) 150 - 400 K/uL   Neutrophils Relative % 70  43 - 77 %   Lymphocytes Relative 24  12 - 46 %   Monocytes Relative 4  3 - 12 %   Eosinophils Relative 2  0 - 5 %   Basophils Relative 0  0 - 1 %   Neutro Abs 8.6 (*) 1.7 - 7.7 K/uL   Lymphs Abs 3.0  0.7 - 4.0 K/uL   Monocytes Absolute 0.5  0.1 - 1.0 K/uL   Eosinophils Absolute 0.2  0.0 - 0.7 K/uL   Basophils Absolute 0.0  0.0 - 0.1 K/uL   RBC Morphology TARGET CELLS     Smear Review LARGE PLATELETS PRESENT    COMPREHENSIVE METABOLIC PANEL      Result Value Ref Range   Sodium 139  137 - 147 mEq/L   Potassium 3.8  3.7 - 5.3 mEq/L   Chloride 101  96 - 112 mEq/L   CO2 23  19 - 32 mEq/L   Glucose, Bld 143 (*) 70 - 99 mg/dL   BUN 8  6 - 23 mg/dL   Creatinine, Ser 0.50  0.50 - 1.10 mg/dL   Calcium 9.3  8.4 - 10.5 mg/dL   Total Protein 8.4 (*) 6.0 - 8.3 g/dL   Albumin 3.3 (*) 3.5 - 5.2 g/dL   AST 13  0 - 37 U/L   ALT 14  0 - 35 U/L   Alkaline Phosphatase 158 (*) 39 - 117 U/L   Total Bilirubin 0.3  0.3 - 1.2 mg/dL   GFR calc non Af Amer >90  >90 mL/min   GFR calc Af Amer >90  >90 mL/min  HEPARIN LEVEL (UNFRACTIONATED)      Result Value Ref Range   Heparin Unfractionated 0.57  0.30 - 0.70 IU/mL  CBC      Result Value Ref Range   WBC 10.9 (*) 4.0 - 10.5 K/uL   RBC 4.26  3.87 - 5.11 MIL/uL   Hemoglobin 8.5 (*) 12.0 - 15.0 g/dL   HCT 26.5 (*) 36.0 - 46.0 %   MCV 62.2 (*) 78.0 - 100.0 fL   MCH 20.0 (*) 26.0 - 34.0 pg   MCHC 32.1  30.0 - 36.0 g/dL   RDW 19.8 (*) 11.5 - 15.5 %   Platelets 305  150 - 400 K/uL  TROPONIN I      Result Value Ref Range   Troponin I <0.30  <0.30 ng/mL  TROPONIN I      Result Value Ref Range   Troponin I <0.30  <0.30 ng/mL  TROPONIN I  Result Value Ref Range   Troponin I <0.30  <0.30 ng/mL  GLUCOSE, CAPILLARY      Result  Value Ref Range   Glucose-Capillary 162 (*) 70 - 99 mg/dL  CBC      Result Value Ref Range   WBC 10.5  4.0 - 10.5 K/uL   RBC 4.93  3.87 - 5.11 MIL/uL   Hemoglobin 9.5 (*) 12.0 - 15.0 g/dL   HCT 31.0 (*) 36.0 - 46.0 %   MCV 62.9 (*) 78.0 - 100.0 fL   MCH 19.3 (*) 26.0 - 34.0 pg   MCHC 30.6  30.0 - 36.0 g/dL   RDW 20.0 (*) 11.5 - 15.5 %   Platelets 332  150 - 400 K/uL  GLUCOSE, CAPILLARY      Result Value Ref Range   Glucose-Capillary 129 (*) 70 - 99 mg/dL  GLUCOSE, CAPILLARY      Result Value Ref Range   Glucose-Capillary 244 (*) 70 - 99 mg/dL  GLUCOSE, CAPILLARY      Result Value Ref Range   Glucose-Capillary 170 (*) 70 - 99 mg/dL  CBC      Result Value Ref Range   WBC 9.0  4.0 - 10.5 K/uL   RBC 4.61  3.87 - 5.11 MIL/uL   Hemoglobin 9.0 (*) 12.0 - 15.0 g/dL   HCT 29.0 (*) 36.0 - 46.0 %   MCV 62.9 (*) 78.0 - 100.0 fL   MCH 19.5 (*) 26.0 - 34.0 pg   MCHC 31.0  30.0 - 36.0 g/dL   RDW 20.0 (*) 11.5 - 15.5 %   Platelets 287  150 - 400 K/uL  GLUCOSE, CAPILLARY      Result Value Ref Range   Glucose-Capillary 201 (*) 70 - 99 mg/dL  GLUCOSE, CAPILLARY      Result Value Ref Range   Glucose-Capillary 171 (*) 70 - 99 mg/dL  I-STAT TROPOININ, ED      Result Value Ref Range   Troponin i, poc 0.00  0.00 - 0.08 ng/mL   Comment 3           I-STAT CHEM 8, ED      Result Value Ref Range   Sodium 141  137 - 147 mEq/L   Potassium 3.8  3.7 - 5.3 mEq/L   Chloride 103  96 - 112 mEq/L   BUN 5 (*) 6 - 23 mg/dL   Creatinine, Ser 0.60  0.50 - 1.10 mg/dL   Glucose, Bld 150 (*) 70 - 99 mg/dL   Calcium, Ion 1.12  1.12 - 1.23 mmol/L   TCO2 22  0 - 100 mmol/L   Hemoglobin 12.2  12.0 - 15.0 g/dL   HCT 36.0  36.0 - 46.0 %   A/P: Pt with hx of stage IIIA endometrial carcinoma, poor venous access, recent STEMI with coronary stenting. Tent plan is for port a cath placement on 5/13. Details/risks of procedure d/w pt with her understanding and consent. Will plan to hold aggrastat only short term if  necessary- cardiology prefers to maintain drip if possible.

## 2013-07-23 ENCOUNTER — Inpatient Hospital Stay (HOSPITAL_COMMUNITY): Payer: No Typology Code available for payment source

## 2013-07-23 ENCOUNTER — Ambulatory Visit: Payer: No Typology Code available for payment source

## 2013-07-23 ENCOUNTER — Other Ambulatory Visit: Payer: No Typology Code available for payment source

## 2013-07-23 ENCOUNTER — Telehealth: Payer: Self-pay | Admitting: *Deleted

## 2013-07-23 ENCOUNTER — Ambulatory Visit: Payer: No Typology Code available for payment source | Admitting: Oncology

## 2013-07-23 DIAGNOSIS — I219 Acute myocardial infarction, unspecified: Secondary | ICD-10-CM

## 2013-07-23 LAB — GLUCOSE, CAPILLARY
GLUCOSE-CAPILLARY: 185 mg/dL — AB (ref 70–99)
Glucose-Capillary: 216 mg/dL — ABNORMAL HIGH (ref 70–99)
Glucose-Capillary: 179 mg/dL — ABNORMAL HIGH (ref 70–99)

## 2013-07-23 MED ORDER — MIDAZOLAM HCL 2 MG/2ML IJ SOLN
INTRAMUSCULAR | Status: AC | PRN
  Administered 2013-07-23: 2 mg via INTRAVENOUS
  Administered 2013-07-23 (×2): 1 mg via INTRAVENOUS

## 2013-07-23 MED ORDER — MIDAZOLAM HCL 2 MG/2ML IJ SOLN
INTRAMUSCULAR | Status: AC
  Filled 2013-07-23: qty 4

## 2013-07-23 MED ORDER — FENTANYL CITRATE 0.05 MG/ML IJ SOLN
INTRAMUSCULAR | Status: AC | PRN
  Administered 2013-07-23 (×4): 50 ug via INTRAVENOUS

## 2013-07-23 MED ORDER — TICAGRELOR 90 MG PO TABS
90.0000 mg | ORAL_TABLET | Freq: Two times a day (BID) | ORAL | Status: DC
  Administered 2013-07-23: 90 mg via ORAL
  Filled 2013-07-23 (×2): qty 1

## 2013-07-23 MED ORDER — TICAGRELOR 90 MG PO TABS
180.0000 mg | ORAL_TABLET | Freq: Once | ORAL | Status: DC
  Filled 2013-07-23 (×2): qty 2

## 2013-07-23 MED ORDER — FENTANYL CITRATE 0.05 MG/ML IJ SOLN
INTRAMUSCULAR | Status: AC
  Filled 2013-07-23: qty 4

## 2013-07-23 NOTE — Progress Notes (Addendum)
ANTICOAGULATION CONSULT NOTE - Follow Up Consult  Pharmacy Consult for Tirofiban Indication: bridging while Ticagrelor PO interrupted in setting of recent stent placement   Allergies  Allergen Reactions  . Amoxicillin Nausea And Vomiting, Swelling and Rash    Rash, vomiting, throat closing Rash, vomiting, throat closing  . Flagyl [Metronidazole] Nausea And Vomiting    04/30/13-Per pt she is unable to tolerate this medication, causes rash 04/30/13-Per pt she is unable to tolerate this medication, causes rash  . Adhesive [Tape] Rash and Dermatitis    "takes skin off" "takes skin off"    Patient Measurements: Height: 5' 6.14" (168 cm) Weight: 247 lb 5.7 oz (112.2 kg) IBW/kg (Calculated) : 59.63 Heparin Dosing Weight:   Vital Signs: Temp: 97.9 F (36.6 C) (05/13 1503) Temp src: Oral (05/13 1503) BP: 103/60 mmHg (05/13 1503) Pulse Rate: 78 (05/13 1503)  Labs:  Recent Labs  07/20/13 2222 07/20/13 2232 07/21/13 0410 07/21/13 0645 07/21/13 0829 07/21/13 1000 07/21/13 1429 07/22/13 0915  HGB 10.2* 12.2  --  8.5*  --  9.5*  --  9.0*  HCT 32.7* 36.0  --  26.5*  --  31.0*  --  29.0*  PLT 411*  --   --  305  --  332  --  287  HEPARINUNFRC  --   --   --  0.57  --   --   --   --   CREATININE 0.50 0.60  --   --   --   --   --  0.47*  TROPONINI  --   --  <0.30  --  <0.30  --  <0.30  --     Estimated Creatinine Clearance: 122.5 ml/min (by C-G formula based on Cr of 0.47).   Medications:  Tirofiban IV gtt at 0.15 mcg/kg/min   Assessment: 38yof who has been receiving Tirofiban while Brilinta was on hold for port-a-cath placement. Port-a-cath was placed earlier today by IR and orders to restart Brilinta were given. Per discussion with Cards Dr. Aundra Dubin, reloading of Brilinta is not required in this setting - received orders to restart Brilinta 90mg  BID and discontinue Tirofiban infusion. Discussed plan with [facility] Dr. Verlon Au.  - Patient refused labs this AM - No significant  bleeding reported   Plan:  1. Restart Brilinta 90mg  PO BID - first dose this evening 2. Stop Tirofiban infusion at the time of Brilinta administration 3. Monitor for s/sx of bleeding  Patsey Berthold [facility] [phone] 07/23/2013,4:18 PM

## 2013-07-23 NOTE — Telephone Encounter (Signed)
Per staff message and POF I have scheduled appts.  JMW  

## 2013-07-23 NOTE — Discharge Summary (Signed)
Physician Discharge Summary  Kelly [name] [id number] DOB: [birth year] DOA: 07/20/2013  PCP: [name], PA-C  Admit date: 07/20/2013 Discharge date: 07/23/2013  Time spent: 30 minutes  Recommendations for Outpatient Follow-up:  1. Continue brillinta 2. 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 Was planned to have admission in the AM 07/21/13 for aggrastat bridging. However, she developed chest pressure. She had pain/pressure for upwards of 30-45 minutes leading to presentation.  In the ER she had a negative CTA (poor study but no large central PE) and cardiology consulted given recent DES. ECG reviewed without overt ST elevation. Heparin gtt starting in the ER along with NTG gtt.  She was seen by cardiolgoy and ACS ruled out and placed on Tirofibran gtt for anticipated procedure She was ruled out for ACS and had no further CP.  Echo was performed showing EF 60% His Oral hypoglycemics were held and CBG's were stable Seizure disorder was stable during hospital stay   Consultants:  Oncology  Cardiology Procedures:  None yet Antibiotics:  none   Discharge Exam: Filed Vitals:   07/23/13 1503  BP: 103/60   Pulse: 78  Temp: 97.9 F (36.6 C)  Resp: 18    General: eomi, Obese Cardiovascular:  s1 s2 no m/r/g Respiratory: cta b  Discharge Instructions You were cared for by a hospitalist during your hospital stay. If you have any questions about your discharge medications or the care you received while you were in the hospital after you are discharged, you can call the unit and asked to speak with the hospitalist on call if the hospitalist that took care of you is not available. Once you are discharged, your primary care physician will handle any further medical issues. Please note that NO REFILLS for any discharge medications will be authorized once you are discharged, as it is imperative that you return to your primary care physician (or establish a relationship with a primary care physician if you do not have one) for your aftercare needs so that they can reassess your need for medications and monitor your lab values.  Discharge Orders   Future Appointments Provider Department Dept Phone   07/28/2013 2:45 PM [facility] [phone]   08/06/2013 1:30 PM [facility] [phone]   08/06/2013 2:00 PM Gordy Levan, MD [facility] [phone]   Future Orders Complete By Expires   Diet - low sodium heart healthy  As directed    Discharge instructions  As directed    Increase activity slowly  As directed        Medication List         aspirin 81 MG  EC tablet  Take 1 tablet (81 mg total) by mouth daily.     atorvastatin 80 MG tablet  Commonly known as:  LIPITOR  Take 1 tablet (80 mg total) by mouth daily at 6 PM.     carvedilol 12.5 MG tablet  Commonly known as:  COREG  Take 1 tablet (12.5 mg total) by mouth 2 (two) times daily.     clotrimazole 2 % vaginal cream  Commonly known as:  GYNE-LOTRIMIN 3  Place 1 Applicatorful vaginally at bedtime.     Ferrous  Fumarate 324 MG Tabs  Take 1 tab on an empty stomach with Vit C tab.     ferrous sulfate 325 (65 FE) MG tablet  Take 325 mg by mouth.     glipiZIDE 5 MG tablet  Commonly known as:  GLUCOTROL  Take 1 tablet (5 mg total) by mouth 2 (two) times daily before a meal.     insulin aspart 100 UNIT/ML injection  Commonly known as:  NOVOLOG  Per sliding scale     Insulin Glargine 100 UNIT/ML Solostar Pen  Commonly known as:  LANTUS SOLOSTAR  Inject 10 Units into the skin daily at 10 pm.     Insulin Syringes (Disposable) U-100 1 ML Misc  Use as directed with insulin     lidocaine-prilocaine cream  Commonly known as:  EMLA  Apply 1-2 hours prior to Portacath access as directed.     LORazepam 1 MG tablet  Commonly known as:  ATIVAN  Place 1/2- 1 tab under tongue or swallow every 6 hr as needed for nausea.  Take 1 tab the night of chemo.  Will make drowsy     metFORMIN 1000 MG tablet  Commonly known as:  GLUCOPHAGE  Take 1 tablet (1,000 mg total) by mouth once.     multivitamin with minerals tablet  Take 1 tablet by mouth daily.     nitroGLYCERIN 0.4 MG SL tablet  Commonly known as:  NITROSTAT  Place 1 tablet (0.4 mg total) under the tongue every 5 (five) minutes as needed for chest pain.     ondansetron 8 MG tablet  Commonly known as:  ZOFRAN  Take 1 tablet (8 mg total) by mouth every 8 (eight) hours as needed for nausea or vomiting (Will not make you drowsy).     OXcarbazepine 150 MG tablet  Commonly known as:  TRILEPTAL  Take 150 mg by mouth daily as needed (epilepsy).     oxyCODONE-acetaminophen 5-325 MG per tablet  Commonly known as:  PERCOCET/ROXICET  Take by mouth every 4 (four) hours as needed for severe pain.     pantoprazole 40 MG tablet  Commonly known as:  PROTONIX  Take 1 tablet (40 mg total) by mouth daily.     polyethylene glycol packet  Commonly known as:  MIRALAX / GLYCOLAX  Take 17 g by mouth daily.     potassium chloride SA 20 MEQ tablet  Commonly  known as:  K-DUR,KLOR-CON  Take 1 tablet (20 mEq total) by mouth daily.     prochlorperazine 10 MG tablet  Commonly known as:  COMPAZINE  Take 1 tablet (10 mg total) by mouth every 6 (six) hours as needed for nausea or vomiting.     STOOL SOFTENER 100 MG capsule  Generic drug:  docusate sodium  Take 200 mg by mouth 2 (two) times daily.     ticagrelor 90 MG Tabs tablet  Commonly known as:  BRILINTA  Take 1 tablet (90  mg total) by mouth 2 (two) times daily.     vitamin C 500 MG tablet  Commonly known as:  ASCORBIC ACID  Take 1 tab daily with iron on an empty stomach.       Allergies  Allergen Reactions  . Amoxicillin Nausea And Vomiting, Swelling and Rash    Rash, vomiting, throat closing Rash, vomiting, throat closing  . Flagyl [Metronidazole] Nausea And Vomiting    04/30/13-Per pt she is unable to tolerate this medication, causes rash 04/30/13-Per pt she is unable to tolerate this medication, causes rash  . Adhesive [Tape] Rash and Dermatitis    "takes skin off" "takes skin off"      The results of significant diagnostics from this hospitalization (including imaging, microbiology, ancillary and laboratory) are listed below for reference.    Significant Diagnostic Studies: Ct Angio Chest Pe W/cm &/or Wo Cm  07/20/2013   CLINICAL DATA:  Shortness of breath and chest pain  EXAM: CT ANGIOGRAPHY CHEST WITH CONTRAST  TECHNIQUE: Multidetector CT imaging of the chest was performed using the standard protocol during bolus administration of intravenous contrast. Multiplanar CT image reconstructions and MIPs were obtained to evaluate the vascular anatomy.  CONTRAST:  77mL OMNIPAQUE IOHEXOL 350 MG/ML SOLN  COMPARISON:  DG CHEST 1V PORT dated 07/20/2013; CT ANGIO CHEST W/CM &/OR WO/CM dated 06/15/2013  FINDINGS: Technically limited study due to motion artifact. There is moderately good opacification of the central and proximal segmental pulmonary arteries. No definitive filling defects are  demonstrated. No evidence of significant central pulmonary embolus. Peripheral pulmonary arteries are not well demonstrated and remain indeterminate.  Normal heart size. No pericardial effusion. Normal caliber thoracic aorta without evidence of dissection. Esophagus is decompressed. No significant lymphadenopathy in the chest. Visualized upper abdominal organs are grossly unremarkable. Visualization of the lungs is severely limited due to respiratory motion artifact. No gross consolidation is identified. No destructive bone lesions appreciated.  Review of the MIP images confirms the above findings.  IMPRESSION: Limited study due to motion artifact. No evidence of significant central pulmonary embolus.   Electronically Signed   By: Lucienne Capers M.D.   On: 07/20/2013 23:40   Ir Fluoro Guide Cv Line Right  07/23/2013   CLINICAL DATA:  38 year old female with a history of uterine cancer. She requires durable central venous access for chemotherapy. Unfortunately, she had a recent myocardial infarction requiring placement of a drug-eluting stent 1 month ago. She is currently on and Aggrenox tripped and cannot come off her anticoagulation. Therefore, the port catheter will be placed with the use of Bovie electrocautery to ensure hemostasis.  EXAM: IR RIGHT FLOURO GUIDE CV LINE; IR ULTRASOUND GUIDANCE VASC ACCESS RIGHT  Date: 07/23/2013  ANESTHESIA/SEDATION: Moderate (conscious) sedation was administered during this procedure. A total of 4 mg Versed and 200 mg Fentanyl were administered intravenously. The patient's vital signs were monitored continuously by radiology nursing throughout the course of the procedure.  Total sedation time: 30 minutes  FLUOROSCOPY TIME:  36 seconds  TECHNIQUE: The right neck and chest was prepped with chlorhexidine, and draped in the usual sterile fashion using maximum barrier technique (cap and mask, sterile gown, sterile gloves, large sterile sheet, hand hygiene and cutaneous  antiseptic). Antibiotic prophylaxis was provided with 1.5 g vancomycin administered IV one hour prior to skin incision. Local anesthesia was attained by infiltration with 1% lidocaine with epinephrine.  Ultrasound demonstrated patency of the right internal jugular vein, and this was documented with an image. Under real-time ultrasound guidance,  this vein was accessed with a 21 gauge micropuncture needle and image documentation was performed. A small dermatotomy was made at the access site with an 11 scalpel. A 0.018" wire was advanced into the SVC and the access needle exchanged for a 73F micropuncture vascular sheath. The 0.018" wire was then removed and a 0.035" wire advanced into the IVC.  An appropriate location for the subcutaneous reservoir was selected below the clavicle and an incision was made through the skin and underlying soft tissues using Bovie electrocautery. The subcutaneous tissues were then dissected using a combination of blunt and sharp surgical technique and a pocket was formed. A single lumen power injectable portacatheter was then tunneled through the subcutaneous tissues from the pocket to the dermatotomy and the port reservoir placed within the subcutaneous pocket.  The venous access site was then serially dilated and a peel away vascular sheath placed over the wire. The wire was removed and the port catheter advanced into position under fluoroscopic guidance. The catheter tip is positioned in the upper right atrium. This was documented with a spot image. The portacatheter was then tested and found to flush and aspirate well. The port was flushed with saline followed by 100 units/mL heparinized saline.  The pocket was then closed in two layers using first subdermal inverted interrupted absorbable sutures followed by a running subcuticular suture. The epidermis was then sealed with Dermabond. The dermatotomy at the venous access site was also closed with a single inverted subdermal suture and  the epidermis sealed with Dermabond.  COMPLICATIONS: None.  The patient tolerated the procedure well.  IMPRESSION: Successful placement of a right IJ approach Power Port with ultrasound and fluoroscopic guidance. The catheter is ready for use.  Signed,  Criselda Peaches, MD  [facility]  [facility]   Electronically Signed   By: Jacqulynn Cadet M.D.   On: 07/23/2013 15:22   Ir US Guide Vasc Access Right  07/23/2013   CLINICAL DATA:  38 year old female with a history of uterine cancer. She requires durable central venous access for chemotherapy. Unfortunately, she had a recent myocardial infarction requiring placement of a drug-eluting stent 1 month ago. She is currently on and Aggrenox tripped and cannot come off her anticoagulation. Therefore, the port catheter will be placed with the use of Bovie electrocautery to ensure hemostasis.  EXAM: IR RIGHT FLOURO GUIDE CV LINE; IR ULTRASOUND GUIDANCE VASC ACCESS RIGHT  Date: 07/23/2013  ANESTHESIA/SEDATION: Moderate (conscious) sedation was administered during this procedure. A total of 4 mg Versed and 200 mg Fentanyl were administered intravenously. The patient's vital signs were monitored continuously by radiology nursing throughout the course of the procedure.  Total sedation time: 30 minutes  FLUOROSCOPY TIME:  36 seconds  TECHNIQUE: The right neck and chest was prepped with chlorhexidine, and draped in the usual sterile fashion using maximum barrier technique (cap and mask, sterile gown, sterile gloves, large sterile sheet, hand hygiene and cutaneous antiseptic). Antibiotic prophylaxis was provided with 1.5 g vancomycin administered IV one hour prior to skin incision. Local anesthesia was attained by infiltration with 1% lidocaine with epinephrine.  Ultrasound demonstrated patency of the right internal jugular vein, and this was documented with an image. Under real-time ultrasound guidance, this vein was accessed  with a 21 gauge micropuncture needle and image documentation was performed. A small dermatotomy was made at the access site with an 11 scalpel. A 0.018" wire was advanced into the SVC and the access needle exchanged for a  47F micropuncture vascular sheath. The 0.018" wire was then removed and a 0.035" wire advanced into the IVC.  An appropriate location for the subcutaneous reservoir was selected below the clavicle and an incision was made through the skin and underlying soft tissues using Bovie electrocautery. The subcutaneous tissues were then dissected using a combination of blunt and sharp surgical technique and a pocket was formed. A single lumen power injectable portacatheter was then tunneled through the subcutaneous tissues from the pocket to the dermatotomy and the port reservoir placed within the subcutaneous pocket.  The venous access site was then serially dilated and a peel away vascular sheath placed over the wire. The wire was removed and the port catheter advanced into position under fluoroscopic guidance. The catheter tip is positioned in the upper right atrium. This was documented with a spot image. The portacatheter was then tested and found to flush and aspirate well. The port was flushed with saline followed by 100 units/mL heparinized saline.  The pocket was then closed in two layers using first subdermal inverted interrupted absorbable sutures followed by a running subcuticular suture. The epidermis was then sealed with Dermabond. The dermatotomy at the venous access site was also closed with a single inverted subdermal suture and the epidermis sealed with Dermabond.  COMPLICATIONS: None.  The patient tolerated the procedure well.  IMPRESSION: Successful placement of a right IJ approach Power Port with ultrasound and fluoroscopic guidance. The catheter is ready for use.  Signed,  Criselda Peaches, MD  [facility]  [facility]   Electronically  Signed   By: Jacqulynn Cadet M.D.   On: 07/23/2013 15:22   Dg Chest Port 1 View  07/20/2013   CLINICAL DATA:  Left-sided chest pain  EXAM: PORTABLE CHEST - 1 VIEW  COMPARISON:  06/20/2013  FINDINGS: The heart size and mediastinal contours are within normal limits. Both lungs are clear. The visualized skeletal structures are unremarkable.  IMPRESSION: No active disease.   Electronically Signed   By: Inez Catalina M.D.   On: 07/20/2013 22:54    Microbiology: Recent Results (from the past 240 hour(s))  MRSA PCR SCREENING     Status: None   Collection Time    07/21/13  2:25 AM      Result Value Ref Range Status   MRSA by PCR NEGATIVE  NEGATIVE Final   Comment:            The GeneXpert MRSA Assay (FDA     approved for NASAL specimens     only), is one component of a     comprehensive MRSA colonization     surveillance program. It is not     intended to diagnose MRSA     infection nor to guide or     monitor treatment for     MRSA infections.     Labs: Basic Metabolic Panel:  Recent Labs Lab 07/20/13 2222 07/20/13 2232 07/22/13 0915  NA 139 141 138  K 3.8 3.8 3.9  CL 101 103 101  CO2 23  --  24  GLUCOSE 143* 150* 229*  BUN 8 5* 8  CREATININE 0.50 0.60 0.47*  CALCIUM 9.3  --  8.6   Liver Function Tests:  Recent Labs Lab 07/20/13 2222  AST 13  ALT 14  ALKPHOS 158*  BILITOT 0.3  PROT 8.4*  ALBUMIN 3.3*   No results found for this basename: LIPASE, AMYLASE,  in the last 168 hours No results found for  this basename: AMMONIA,  in the last 168 hours CBC:  Recent Labs Lab 07/20/13 2222 07/20/13 2232 07/21/13 0645 07/21/13 1000 07/22/13 0915  WBC 12.3*  --  10.9* 10.5 9.0  NEUTROABS 8.6*  --   --   --   --   HGB 10.2* 12.2 8.5* 9.5* 9.0*  HCT 32.7* 36.0 26.5* 31.0* 29.0*  MCV 62.2*  --  62.2* 62.9* 62.9*  PLT 411*  --  305 332 287   Cardiac Enzymes:  Recent Labs Lab 07/21/13 0410 07/21/13 0829 07/21/13 1429  TROPONINI <0.30 <0.30 <0.30   BNP: BNP  (last 3 results)  Recent Labs  06/16/13 0810  PROBNP 5.6   CBG:  Recent Labs Lab 07/22/13 1210 07/22/13 1709 07/22/13 2109 07/23/13 0607 07/23/13 1111  GLUCAP 221* 247* 176* 185* 216*       Signed:  Jai-Gurmukh Jenaye Rickert  [facility] 07/23/2013, 4:18 PM

## 2013-07-23 NOTE — Procedures (Signed)
Interventional Radiology Procedure Note  Procedure: Placement of a right IJ approach single lumen PowerPort.  Tip is positioned at the superior cavoatrial junction and catheter is ready for immediate use.  Complications: No immediate Recommendations:  - Ok to shower tomorrow - Do not submerge for 7 days - Routine line care   Signed,  Heath K. McCullough, MD [facility]   

## 2013-07-23 NOTE — Progress Notes (Signed)
Discharge education and medication given to pt and reviewed, pt stated understanding and that she had no questions, Brillenta given, IV and tele removed Rickard Rhymes, RN

## 2013-07-24 ENCOUNTER — Telehealth: Payer: Self-pay | Admitting: Oncology

## 2013-07-24 NOTE — Telephone Encounter (Signed)
cld & spoke w/[pt in re to appt for 5/18. Pt stated will not be coming because she is out of town. Adv pt of the 5/27 appt. Sent [facility] a staff message in regard to pt cancelling the chemo.

## 2013-07-24 NOTE — Telephone Encounter (Signed)
tsd  °

## 2013-07-28 ENCOUNTER — Other Ambulatory Visit: Payer: No Typology Code available for payment source

## 2013-07-28 ENCOUNTER — Telehealth: Payer: Self-pay | Admitting: Oncology

## 2013-07-28 ENCOUNTER — Other Ambulatory Visit: Payer: Self-pay | Admitting: *Deleted

## 2013-07-28 ENCOUNTER — Ambulatory Visit: Payer: No Typology Code available for payment source

## 2013-07-28 ENCOUNTER — Telehealth: Payer: Self-pay | Admitting: *Deleted

## 2013-07-28 DIAGNOSIS — C541 Malignant neoplasm of endometrium: Secondary | ICD-10-CM

## 2013-07-28 NOTE — Telephone Encounter (Signed)
S/w the pt and she is aware of her appts this Friday on 08/01/2013 @4 :00pm

## 2013-07-28 NOTE — Telephone Encounter (Signed)
Pt called regarding appointments. She states she is in [facility] at present time and thought she was being scheduled for labs and Dr [name] on Friday, 5/22. She had to cancel lab/chemo for today. Would like to see Dr [name] this week if possible. Has concerns about taking chemo as she had the heart attack shortly after last treatment. She wants to take chemo, just concerned.

## 2013-07-28 NOTE — Progress Notes (Unsigned)
Noted previous phone note that patient out of town and would not be coming to this appointment.

## 2013-07-30 ENCOUNTER — Telehealth: Payer: Self-pay | Admitting: Medical

## 2013-07-30 NOTE — Telephone Encounter (Signed)
done

## 2013-07-31 ENCOUNTER — Other Ambulatory Visit: Payer: No Typology Code available for payment source

## 2013-07-31 ENCOUNTER — Ambulatory Visit: Payer: No Typology Code available for payment source | Admitting: Oncology

## 2013-07-31 ENCOUNTER — Other Ambulatory Visit: Payer: Self-pay | Admitting: Medical

## 2013-08-01 ENCOUNTER — Other Ambulatory Visit: Payer: No Typology Code available for payment source

## 2013-08-01 ENCOUNTER — Other Ambulatory Visit: Payer: Self-pay | Admitting: Oncology

## 2013-08-01 ENCOUNTER — Ambulatory Visit: Payer: No Typology Code available for payment source | Admitting: Oncology

## 2013-08-01 ENCOUNTER — Telehealth: Payer: Self-pay | Admitting: *Deleted

## 2013-08-01 NOTE — Telephone Encounter (Signed)
Pt called to say she has to reschedule today's appointment. Has to take baby to ED due to high fever. Already has an appointment on Wednesday, 5/27

## 2013-08-06 ENCOUNTER — Ambulatory Visit (HOSPITAL_BASED_OUTPATIENT_CLINIC_OR_DEPARTMENT_OTHER): Payer: No Typology Code available for payment source | Admitting: Oncology

## 2013-08-06 ENCOUNTER — Encounter: Payer: Self-pay | Admitting: Oncology

## 2013-08-06 ENCOUNTER — Other Ambulatory Visit: Payer: Self-pay | Admitting: *Deleted

## 2013-08-06 ENCOUNTER — Other Ambulatory Visit (HOSPITAL_BASED_OUTPATIENT_CLINIC_OR_DEPARTMENT_OTHER): Payer: No Typology Code available for payment source

## 2013-08-06 VITALS — BP 139/91 | HR 101 | Temp 98.3°F | Resp 18 | Ht 66.25 in | Wt 252.0 lb

## 2013-08-06 DIAGNOSIS — C549 Malignant neoplasm of corpus uteri, unspecified: Secondary | ICD-10-CM

## 2013-08-06 DIAGNOSIS — C541 Malignant neoplasm of endometrium: Secondary | ICD-10-CM

## 2013-08-06 LAB — COMPREHENSIVE METABOLIC PANEL (CC13)
ALK PHOS: 163 U/L — AB (ref 40–150)
ALT: 12 U/L (ref 0–55)
AST: 11 U/L (ref 5–34)
Albumin: 3.1 g/dL — ABNORMAL LOW (ref 3.5–5.0)
Anion Gap: 8 mEq/L (ref 3–11)
BUN: 8.4 mg/dL (ref 7.0–26.0)
CO2: 24 mEq/L (ref 22–29)
CREATININE: 0.7 mg/dL (ref 0.6–1.1)
Calcium: 8.9 mg/dL (ref 8.4–10.4)
Chloride: 105 mEq/L (ref 98–109)
Glucose: 417 mg/dl — ABNORMAL HIGH (ref 70–140)
Potassium: 4 mEq/L (ref 3.5–5.1)
SODIUM: 137 meq/L (ref 136–145)
TOTAL PROTEIN: 7.4 g/dL (ref 6.4–8.3)
Total Bilirubin: 0.29 mg/dL (ref 0.20–1.20)

## 2013-08-06 LAB — CBC WITH DIFFERENTIAL/PLATELET
BASO%: 0.4 % (ref 0.0–2.0)
Basophils Absolute: 0 10*3/uL (ref 0.0–0.1)
EOS%: 1.8 % (ref 0.0–7.0)
Eosinophils Absolute: 0.2 10*3/uL (ref 0.0–0.5)
HCT: 32.2 % — ABNORMAL LOW (ref 34.8–46.6)
HGB: 10.1 g/dL — ABNORMAL LOW (ref 11.6–15.9)
LYMPH%: 16 % (ref 14.0–49.7)
MCH: 20 pg — ABNORMAL LOW (ref 25.1–34.0)
MCHC: 31.2 g/dL — ABNORMAL LOW (ref 31.5–36.0)
MCV: 64 fL — AB (ref 79.5–101.0)
MONO#: 0.5 10*3/uL (ref 0.1–0.9)
MONO%: 4.7 % (ref 0.0–14.0)
NEUT#: 8.2 10*3/uL — ABNORMAL HIGH (ref 1.5–6.5)
NEUT%: 77.1 % — ABNORMAL HIGH (ref 38.4–76.8)
Platelets: 292 10*3/uL (ref 145–400)
RBC: 5.04 10*6/uL (ref 3.70–5.45)
RDW: 23.1 % — ABNORMAL HIGH (ref 11.2–14.5)
WBC: 10.7 10*3/uL — AB (ref 3.9–10.3)
lymph#: 1.7 10*3/uL (ref 0.9–3.3)

## 2013-08-06 MED ORDER — METOPROLOL TARTRATE 25 MG PO TABS: 25.0000 mg | ORAL_TABLET | Freq: Two times a day (BID) | ORAL | Status: DC

## 2013-08-06 MED ORDER — ATORVASTATIN CALCIUM 80 MG PO TABS
80.0000 mg | ORAL_TABLET | Freq: Every day | ORAL | Status: AC
Start: 2013-08-06 — End: ?

## 2013-08-06 MED ORDER — ATORVASTATIN CALCIUM 80 MG PO TABS: 80.0000 mg | ORAL_TABLET | Freq: Every day | ORAL | Status: DC

## 2013-08-06 NOTE — Progress Notes (Signed)
OFFICE PROGRESS NOTE   08/06/2013   Physicians:Thomas Megan Mans [place], John [place], [address] [place], [place].   INTERVAL HISTORY:   Patient is seen, with infant daughter, for the first time back at this office since she was hospitalized with chest pain/ for bridging with tirofiban infusion while Brilinta held prior to [facility] placement. She ruled out for acute cardiac event and CT angio chest was negative for PE; PAC was successfully placed, tho she had extensive local bruising and soreness over next several days. She is back on Brilinta. Patient then went to visit family in [facility], cancelling visits at this office on 5-18, and also 08-01-13 due to baby being ill.  Patient has had intermittent chest pressure, most recently yesterday while driving, and also an episode the day prior. Nitroglycerin is helpful. She is not presently scheduled back to cardiology, however I have contacted Dr Evette Georges office and set up appointment with PA on 08-18-13.  She has not had metoprolol in 2 days, that prescription refilled now.  Blood sugars have been 115 - 127.  ONCOLOGIC HISTORY   Endometrial ca   05/01/2013 Initial Diagnosis Endometrial ca   05/16/2013 Surgery IIIA endometrial ca with + adnexal involement, negative nodes  Patient had long history of irregular, heavy vaginal bleeding and pelvic pain, evaluated at various locations since 2000. She was seen in [facility] ED 03-27-13 for increased pain and heavy vaginal bleeding, with CT AP showing large mass involving endometrium 12.5 x 8.0x5.2 cm and apparent left hydrosalpinx. PAP 04-03-13 had atypical squamous cells, negative high risk HPV; endometrial biopsy showed complex hyperplasia with atypia and florid papillary proliferation, ER, p53 and p16 positive. She saw Dr [name] on 04-30-13 and had robotic assisted total laparoscopic hysterectomy/BSO/bilateral pelvic and para aortic node evalustion at [facility] 05-16-2013. Pathology from [facility] [phone]) had endometrioid  adenocarcinoma FIGO grade 2, invading 2 cm into myometrium where wall was 2.5 cm thick, no serosal involvement, lower uterine segment involvement present, cervical involvement present 25m depth, involvement of left fallopian tube and right ovary,lymphovascular space invasion present and extensive, all nodes negative (8 right pelvic, 6 left pelvic, 5 right periaortic, 2 left periaortic). Stains were negative for serous component. [facility], described as screaming and shaking by family, for which she was seen by neurology and medication changed to trileptal. Recommendation was 6 cycles of adjuvant carboplatin taxol chemotherapy given in split course with radiation. [facility] with necessary steroid premedication. She did not monitor blood sugars as instructed after that chemotherapy. LE pain began ~ day 3 consistent with taxol aches. She developed central chest pressure and SOB with left arm pain on day 5, [facility] which [facility]. Tho initially there was not indication of cardiac etiology, she had acute STEMI on 06-17-13, with cath and RCA stent by Dr KClaiborne Billings   Review of systems as above, also: No fever or symptoms of infection. SOB with exertion as she has been. Cardiac rehab not yet set up due to rest of situation. No LE swelling. Remainder of 10 point Review of Systems negative.  Objective:  Vital signs in last 24 hours:  BP 139/91  Pulse 101  Temp(Src) 98.3 F (36.8 C) (Oral)  Resp 18  Ht 5' 6.25" (1.683 m)  Wt 252 lb (114.306 kg)  BMI 40.36 kg/m2  LMP 02/24/2013  Alert, oriented and appropriate. Ambulatory without difficulty.  Alopecia  HEENT:PERRL, sclerae  not icteric. Oral mucosa moist without lesions, posterior pharynx clear.  Neck supple. No JVD.  Lymphatics:no cervical,suraclavicular adenopathy Resp: clear to auscultation bilaterally and normal percussion  bilaterally Cardio: regular rate and rhythm. No gallop. GI: obese, soft, nontender, not distended, no appreciable mass or organomegaly. Normally active bowel sounds. Surgical incision not remarkable. Musculoskeletal/ Extremities: without pitting edema, cords, tenderness Neuro: no peripheral neuropathy. Otherwise nonfocal Skin without rash, ecchymosis, petechiae Portacath right anterior chest, resolving ecchymosis, surgical glue, site otherwise not remarkable.  Lab Results:  Results for orders placed in visit on 08/06/13  CBC WITH DIFFERENTIAL      Result Value Ref Range   WBC 10.7 (*) 3.9 - 10.3 10e3/uL   NEUT# 8.2 (*) 1.5 - 6.5 10e3/uL   HGB 10.1 (*) 11.6 - 15.9 g/dL   HCT 32.2 (*) 34.8 - 46.6 %   Platelets 292  145 - 400 10e3/uL   MCV 64.0 (*) 79.5 - 101.0 fL   MCH 20.0 (*) 25.1 - 34.0 pg   MCHC 31.2 (*) 31.5 - 36.0 g/dL   RBC 5.04  3.70 - 5.45 10e6/uL   RDW 23.1 (*) 11.2 - 14.5 %   lymph# 1.7  0.9 - 3.3 10e3/uL   MONO# 0.5  0.1 - 0.9 10e3/uL   Eosinophils Absolute 0.2  0.0 - 0.5 10e3/uL   Basophils Absolute 0.0  0.0 - 0.1 10e3/uL   NEUT% 77.1 (*) 38.4 - 76.8 %   LYMPH% 16.0  14.0 - 49.7 %   MONO% 4.7  0.0 - 14.0 %   EOS% 1.8  0.0 - 7.0 %   BASO% 0.4  0.0 - 2.0 %  COMPREHENSIVE METABOLIC PANEL (TF57)      Result Value Ref Range   Sodium 137  136 - 145 mEq/L   Potassium 4.0  3.5 - 5.1 mEq/L   Chloride 105  98 - 109 mEq/L   CO2 24  22 - 29 mEq/L   Glucose 417 (*) 70 - 140 mg/dl   BUN 8.4  7.0 - 26.0 mg/dL   Creatinine 0.7  0.6 - 1.1 mg/dL   Total Bilirubin 0.29  0.20 - 1.20 mg/dL   Alkaline Phosphatase 163 (*) 40 - 150 U/L   AST 11  5 - 34 U/L   ALT 12  0 - 55 U/L   Total Protein 7.4  6.4 - 8.3 g/dL   Albumin 3.1 (*) 3.5 - 5.0 g/dL   Calcium 8.9  8.4 - 10.4 mg/dL   Anion Gap 8  3 - 11 mEq/L     Studies/Results:  No results found.  Medications: I have reviewed the patient's current medications. Metoprolol and atorvastin renewed.   DISCUSSION: so far not able to  resume chemo since initial taxol carboplatin on 06-11-13, due to acute MI week after treatment, with 2 hospitalizations. Intermittent chest pain still concerning, tho reevaluation in hospital 5-10 thru 07-23-13 for similar symptoms did not identify cardiac etiology. Will try chemo using just carboplatin on 08-11-13. Monday afternoons best as friend can help with baby then if other arrangements for childcare not possible ([place] social worker involved with this).  Assessment/Plan:  1.IIIA endometrial carcinoma FIGO 2: post surgery at [facility] 05-16-13 and one cycle taxol + carboplatin on 06-11-13, with treatment interrupted since then by acute MI 06-17-13 and other problems. Will try to resume using just carboplatin on 08-11-13. 2.inferior STEMI 06-17-13 with RCA stent: intermittent chest discomfort, for follow up with cardiology next available 08-18-13 or to ED if symptoms  significant prior. Refill metoprolol. Continue Brilinta 3.diabetes: blood sugars much improved. Will not need extra decadron without taxol this cycle 4.difficult social situation: husband died ~ a month after adoption of baby Toi. Transportation and childcare difficult. Not able to work presently. 5.PAC in: required hospitalization due to recent coronary stent and need to be off Brilinta 6.hx seizure disorder 7.sickle trait by Hgb electrophoresis at presentation 8.multifactorial anemia   I will see her back 08-20-13 or sooner if needed.   Gordy Levan, MD   08/06/2013, 4:52 PM

## 2013-08-06 NOTE — Telephone Encounter (Signed)
Lipitor escribed, hard copy shredded

## 2013-08-08 ENCOUNTER — Telehealth: Payer: Self-pay | Admitting: Oncology

## 2013-08-08 NOTE — Telephone Encounter (Signed)
s/w pt re appts for 6/1 and 6/10. also confirmed appt for 6/8 @ Dr. Evette Georges office per 5/27 pof

## 2013-08-11 ENCOUNTER — Other Ambulatory Visit: Payer: Self-pay | Admitting: *Deleted

## 2013-08-11 ENCOUNTER — Ambulatory Visit (HOSPITAL_BASED_OUTPATIENT_CLINIC_OR_DEPARTMENT_OTHER): Payer: No Typology Code available for payment source

## 2013-08-11 ENCOUNTER — Other Ambulatory Visit: Payer: Self-pay | Admitting: Oncology

## 2013-08-11 VITALS — BP 136/92 | HR 91 | Temp 98.5°F

## 2013-08-11 DIAGNOSIS — C549 Malignant neoplasm of corpus uteri, unspecified: Secondary | ICD-10-CM

## 2013-08-11 DIAGNOSIS — Z5111 Encounter for antineoplastic chemotherapy: Secondary | ICD-10-CM

## 2013-08-11 DIAGNOSIS — D509 Iron deficiency anemia, unspecified: Secondary | ICD-10-CM

## 2013-08-11 DIAGNOSIS — C541 Malignant neoplasm of endometrium: Secondary | ICD-10-CM

## 2013-08-11 DIAGNOSIS — E119 Type 2 diabetes mellitus without complications: Secondary | ICD-10-CM

## 2013-08-11 LAB — CBC WITH DIFFERENTIAL/PLATELET
BASO%: 0.6 % (ref 0.0–2.0)
BASOS ABS: 0.1 10*3/uL (ref 0.0–0.1)
EOS%: 1.7 % (ref 0.0–7.0)
Eosinophils Absolute: 0.2 10*3/uL (ref 0.0–0.5)
HEMATOCRIT: 33.7 % — AB (ref 34.8–46.6)
HEMOGLOBIN: 10.6 g/dL — AB (ref 11.6–15.9)
LYMPH%: 20.6 % (ref 14.0–49.7)
MCH: 20 pg — ABNORMAL LOW (ref 25.1–34.0)
MCHC: 31.3 g/dL — AB (ref 31.5–36.0)
MCV: 64 fL — ABNORMAL LOW (ref 79.5–101.0)
MONO#: 0.5 10*3/uL (ref 0.1–0.9)
MONO%: 4.8 % (ref 0.0–14.0)
NEUT%: 72.3 % (ref 38.4–76.8)
NEUTROS ABS: 7.6 10*3/uL — AB (ref 1.5–6.5)
PLATELETS: 307 10*3/uL (ref 145–400)
RBC: 5.27 10*6/uL (ref 3.70–5.45)
RDW: 23.2 % — ABNORMAL HIGH (ref 11.2–14.5)
WBC: 10.6 10*3/uL — ABNORMAL HIGH (ref 3.9–10.3)
lymph#: 2.2 10*3/uL (ref 0.9–3.3)

## 2013-08-11 LAB — WHOLE BLOOD GLUCOSE
Glucose: 268 mg/dL — ABNORMAL HIGH (ref 70–100)
HRS PC: 2 Hours

## 2013-08-11 MED ORDER — ONDANSETRON 16 MG/50ML IVPB (CHCC)
16.0000 mg | Freq: Once | INTRAVENOUS | Status: AC
  Administered 2013-08-11: 16 mg via INTRAVENOUS

## 2013-08-11 MED ORDER — INSULIN REGULAR HUMAN 100 UNIT/ML IJ SOLN
2.0000 [IU] | Freq: Once | INTRAMUSCULAR | Status: AC | PRN
  Administered 2013-08-11: 6 [IU] via SUBCUTANEOUS
  Filled 2013-08-11: qty 0.08

## 2013-08-11 MED ORDER — SODIUM CHLORIDE 0.9 % IV SOLN: 1020.0000 mg | Freq: Once | INTRAVENOUS | Status: DC

## 2013-08-11 MED ORDER — DEXAMETHASONE SODIUM PHOSPHATE 10 MG/ML IJ SOLN
INTRAMUSCULAR | Status: AC
  Filled 2013-08-11: qty 1

## 2013-08-11 MED ORDER — CARBOPLATIN CHEMO INJECTION 600 MG/60ML
750.0000 mg | Freq: Once | INTRAVENOUS | Status: AC
  Administered 2013-08-11: 750 mg via INTRAVENOUS
  Filled 2013-08-11: qty 75

## 2013-08-11 MED ORDER — SODIUM CHLORIDE 0.9 % IV SOLN
Freq: Once | INTRAVENOUS | Status: AC
  Administered 2013-08-11: 15:00:00 via INTRAVENOUS

## 2013-08-11 MED ORDER — LIDOCAINE-PRILOCAINE 2.5-2.5 % EX CREA: TOPICAL_CREAM | CUTANEOUS | Status: AC

## 2013-08-11 MED ORDER — ONDANSETRON 16 MG/50ML IVPB (CHCC)
INTRAVENOUS | Status: AC
  Filled 2013-08-11: qty 16

## 2013-08-11 MED ORDER — SODIUM CHLORIDE 0.9 % IJ SOLN
10.0000 mL | INTRAMUSCULAR | Status: DC | PRN
  Administered 2013-08-11: 10 mL
  Filled 2013-08-11: qty 10

## 2013-08-11 MED ORDER — HEPARIN SOD (PORK) LOCK FLUSH 100 UNIT/ML IV SOLN
500.0000 [IU] | Freq: Once | INTRAVENOUS | Status: AC | PRN
  Administered 2013-08-11: 500 [IU]
  Filled 2013-08-11: qty 5

## 2013-08-11 MED ORDER — DEXAMETHASONE SODIUM PHOSPHATE 10 MG/ML IJ SOLN
10.0000 mg | Freq: Once | INTRAMUSCULAR | Status: AC
  Administered 2013-08-11: 10 mg via INTRAVENOUS

## 2013-08-11 NOTE — Patient Instructions (Signed)
[facility] Discharge Instructions for Patients Receiving Chemotherapy  Today you received the following chemotherapy agents carboplatin  To help prevent nausea and vomiting after your treatment, we encourage you to take your nausea medication as directed   If you develop nausea and vomiting that is not controlled by your nausea medication, call the clinic.   BELOW ARE SYMPTOMS THAT SHOULD BE REPORTED IMMEDIATELY:  *FEVER GREATER THAN 100.5 F  *CHILLS WITH OR WITHOUT FEVER  NAUSEA AND VOMITING THAT IS NOT CONTROLLED WITH YOUR NAUSEA MEDICATION  *UNUSUAL SHORTNESS OF BREATH  *UNUSUAL BRUISING OR BLEEDING  TENDERNESS IN MOUTH AND THROAT WITH OR WITHOUT PRESENCE OF ULCERS  *URINARY PROBLEMS  *BOWEL PROBLEMS  UNUSUAL RASH Items with * indicate a potential emergency and should be followed up as soon as possible.  Feel free to call the clinic you have any questions or concerns. The clinic phone number is [phone].  

## 2013-08-12 ENCOUNTER — Telehealth: Payer: Self-pay | Admitting: Family Medicine

## 2013-08-12 NOTE — Telephone Encounter (Signed)
Medical records request Disability Determination

## 2013-08-14 ENCOUNTER — Telehealth: Payer: Self-pay | Admitting: Cardiovascular Disease

## 2013-08-14 NOTE — Telephone Encounter (Signed)
Pt have been having chest pains,palpatations and pressure in her chest.Should she wait until her appt on Monday or go to the ER?

## 2013-08-15 NOTE — Telephone Encounter (Signed)
Late entry: call pt. Late yesterday 5:45 no answer left message. I have called again this morning and no answer again left message for her to call me back

## 2013-08-17 ENCOUNTER — Other Ambulatory Visit: Payer: Self-pay | Admitting: Oncology

## 2013-08-18 ENCOUNTER — Encounter: Payer: Self-pay | Admitting: Cardiology

## 2013-08-18 ENCOUNTER — Telehealth: Payer: Self-pay

## 2013-08-18 ENCOUNTER — Ambulatory Visit (INDEPENDENT_AMBULATORY_CARE_PROVIDER_SITE_OTHER): Payer: No Typology Code available for payment source | Admitting: Cardiology

## 2013-08-18 VITALS — BP 118/82 | HR 96 | Ht 67.0 in | Wt 248.8 lb

## 2013-08-18 DIAGNOSIS — I209 Angina pectoris, unspecified: Secondary | ICD-10-CM

## 2013-08-18 MED ORDER — TICAGRELOR 90 MG PO TABS: 90.0000 mg | ORAL_TABLET | Freq: Two times a day (BID) | ORAL | Status: DC

## 2013-08-18 MED ORDER — ISOSORBIDE MONONITRATE ER 30 MG PO TB24: 15.0000 mg | ORAL_TABLET | Freq: Every day | ORAL | Status: DC

## 2013-08-18 NOTE — Patient Instructions (Signed)
Ask your oncologist about Imdur. If she approves start taking 1/2 tablet (15 mg) once daily for chest pain. Also ask your doctor if it is ok for you to have a stress test if needed. Ask if ok to have a nuclear stress test. Follow-up in clinic in 2 weeks for re-assessment.

## 2013-08-18 NOTE — Progress Notes (Signed)
Patient ID: Kelly [name], female   DOB: [birth year], 38 y.o.   MRN: [id number]    08/22/2013 Kelly [name]   October 16, 1975  [id number]  Primary [place], [place], PA-C Primary Cardiologist: Dr. Claiborne Billings     Oncologist: Dr. [name]  HPI:  Kelly [name] is a 38 year old, AA female, who was recently diagnosed with endometrial carcinoma, s/p total hysterectomy on 05/16/13. Chemotherapy treatments were initiated~ first part of April. She also has a history of diabetes mellitus, morbid obesity and a long-standing history of hypertension. She presented to [facility] on 06/15/13, several days after the start of chemo, with a complaint of severe chest discomfort. EKG revealed inferior ST segment elevations. Troponin was positive. CODE STEMI was activated and she was taken to the lab for urgent catheterization, performed by Dr. Claiborne Billings. She was found to have a proximal RCA thrombus, felt to be due to an ulcerative plaque. This was successfully treated with PCI utilizing a DES. Left ventriculography revealed an ejection fraction of 55%. There was mild mid inferior hypocontractility. She was placed on DAPT with ASA + Brilinta, as well as Lopressor and Lipitor.  I evaluated her on 07/04/13 for post hospital f/u. At that time, she stated that she had been doing well from a cardiac standpoint. She denied recurrent chest pain. No SOB. She was continued on her medications, however her beta blocker was changed from Lopressor to Coreg, due to cost, as she currently does not have insurance and Coreg is on the 4 dollar prescription list at [employer] and [employer]. Her oncologist, Dr. [name], requested that she be cleared from a cardiac standpoint to resume chemotherapy treatments. Her chemotherapy agents were reviewed by Dr. Sallyanne Kuster. It was felt that these were not cardiotoxic and she was cleared to resume chemotherapy. She was instructed to followup with Dr. Claiborne Billings in 4-6 weeks.  She was scheduled to undergo a  procedure at [facility] for insertion of a port-a-cath on 07/23/2013. The plan was to have her stop Brilinta and she was to be admitted on 07/21/2013 for Aggrastat bridging. However, she developed chest pressure the evening prior to her planned admission and presented to the [facility] ER.  In the ER she had a negative CTA. Her EKG was without overt ST elevation. Heparin was started in the ER along with nitroglycerin. She was continued on her home medications including aspirin Coreg and atorvastatin.  Cardiac enzymes were cycled and were negative x3. It was felt that her chest pain was noncardiac. She ultimately underwent port-a-cath placement and afterwards was continued on Brilinta. She was discharged home on 07/22/2013.   She presents back to clinic today.  She continues to complain of recurrent chest pain. These episodes occur now almost every day. The chest discomfort is similar to the chest discomfort she experienced with her MI, however the pain is less severe. It is described as a substernal pressure-like sensation. There's been no particular pattern to her discomfort. It at times can occur at rest but also with light exertion. Her symptoms are always relieved with sublingual nitroglycerin. She on occasion has mild dyspnea, but denies nausea vomiting, diaphoresis, syncope/near-syncope. She states that she has been fully compliant with her medications, including dual antiplatelet therapy.   Current Outpatient Prescriptions  Medication Sig Dispense Refill  . aspirin EC 81 MG EC tablet Take 1 tablet (81 mg total) by mouth daily.  30 tablet  0  . atorvastatin (LIPITOR) 80 MG tablet Take 1 tablet (80 mg total) by mouth daily at 6  PM.  30 tablet  3  . docusate sodium (STOOL SOFTENER) 100 MG capsule Take 300 mg by mouth 2 (two) times daily.       Marland Kitchen glipiZIDE (GLUCOTROL) 5 MG tablet Take 1 tablet (5 mg total) by mouth 2 (two) times daily before a meal.  60 tablet  5  . insulin aspart (NOVOLOG) 100 UNIT/ML  injection As directed      . Insulin Syringes, Disposable, U-100 1 ML MISC Use as directed with insulin  100 each  0  . lidocaine-prilocaine (EMLA) cream Apply 1-2 hours prior to Portacath access as directed.  30 g  1  . LORazepam (ATIVAN) 1 MG tablet Place 1/2- 1 tab under tongue or swallow every 6 hr as needed for nausea.  Take 1 tab the night of chemo.  Will make drowsy  20 tablet  0  . metFORMIN (GLUCOPHAGE) 1000 MG tablet TAKE ONE TABLET BY MOUTH TWICE DAILY WITH MEALS  60 tablet  0  . nitroGLYCERIN (NITROSTAT) 0.4 MG SL tablet Place 1 tablet (0.4 mg total) under the tongue every 5 (five) minutes as needed for chest pain.  30 tablet  1  . ondansetron (ZOFRAN) 8 MG tablet Take 1 tablet (8 mg total) by mouth every 8 (eight) hours as needed for nausea or vomiting (Will not make you drowsy).  30 tablet  1  . OXcarbazepine (TRILEPTAL) 150 MG tablet Take 150 mg by mouth daily as needed (epilepsy).       Marland Kitchen oxyCODONE-acetaminophen (PERCOCET/ROXICET) 5-325 MG per tablet Take by mouth every 4 (four) hours as needed for severe pain.      . pantoprazole (PROTONIX) 40 MG tablet Take 1 tablet (40 mg total) by mouth daily.  30 tablet  2  . prochlorperazine (COMPAZINE) 10 MG tablet Take 1 tablet (10 mg total) by mouth every 6 (six) hours as needed for nausea or vomiting.  20 tablet  0  . ticagrelor (BRILINTA) 90 MG TABS tablet Take 1 tablet (90 mg total) by mouth 2 (two) times daily.  64 tablet  0  . vitamin C (ASCORBIC ACID) 500 MG tablet Take 1 tab daily with iron on an empty stomach.  30 tablet  3  . carvedilol (COREG) 12.5 MG tablet Take 1 tablet (12.5 mg total) by mouth 2 (two) times daily.  180 tablet  3  . ferrous fumarate (HEMOCYTE - 106 MG FE) 325 (106 FE) MG TABS tablet Take 1 tab daily on an empty stomach with OJ.  30 each  2  . isosorbide mononitrate (IMDUR) 30 MG 24 hr tablet Take 0.5 tablets (15 mg total) by mouth daily.  30 tablet  5  . metoprolol tartrate (LOPRESSOR) 25 MG tablet Take 1 tablet  (25 mg total) by mouth 2 (two) times daily.  60 tablet  3   No current facility-administered medications for this visit.    Allergies  Allergen Reactions  . Amoxicillin Nausea And Vomiting, Swelling and Rash    Rash, vomiting, throat closing Rash, vomiting, throat closing  . Flagyl [Metronidazole] Nausea And Vomiting    04/30/13-Per pt she is unable to tolerate this medication, causes rash 04/30/13-Per pt she is unable to tolerate this medication, causes rash  . Latex   . Adhesive [Tape] Rash and Dermatitis    "takes skin off" "takes skin off"    History   Social History  . Marital Status: Widowed    Spouse Name: N/A    Number of Children: 1  .  Years of Education: N/A   Occupational History  . Teacher    Social History Main Topics  . Smoking status: Never Smoker   . Smokeless tobacco: Never Used  . Alcohol Use: No  . Drug Use: No  . Sexual Activity: Yes    Birth Control/ Protection: None   Other Topics Concern  . Not on file   Social History Narrative   As of 06/2013: Widowed 04/01/2013, husband died unexpectedly of MI.   Adopted infant daughter in 02/2013.  Lives with mother and sister.       Review of Systems: General: negative for chills, fever, night sweats or weight changes.  Cardiovascular: negative for chest pain, dyspnea on exertion, edema, orthopnea, palpitations, paroxysmal nocturnal dyspnea or shortness of breath Dermatological: negative for rash Respiratory: negative for cough or wheezing Urologic: negative for hematuria Abdominal: negative for nausea, vomiting, diarrhea, bright red blood per rectum, melena, or hematemesis Neurologic: negative for visual changes, syncope, or dizziness All other systems with aspirin plus Brilintad and are otherwise negative except as noted above.    Blood pressure 118/82, pulse 96, height 5\' 7"  (1.702 m), weight 248 lb 12.8 oz (112.855 kg), last menstrual period 02/24/2013.  General appearance: alert, cooperative, no  distress and moderately obese Neck: no carotid bruit and no JVD Lungs: clear to auscultation bilaterally Heart: regular rate and rhythm, S1, S2 normal, no murmur, click, rub or gallop Extremities: no LEE Pulses: 2+ and symmetric Skin: warm and dry Neurologic: Grossly normal  EKG NSR, nonspecific Twave abnormalities, HR 96 bpm  ASSESSMENT AND PLAN:   1. CAD: Status post acute MI, 2 months ago, resulting in PCI plus drug-eluting stent to the RCA. Unfortunately, she continues to have frequent anginal like symptoms which have been relieved with sublingual nitroglycerin. She reports full compliance with her medications which include dual antiplatelet therapy with aspirin plus Brilinta, as well as a beta blocker and statin. Since her chest pain is nitrate responsive, we will initiate a long-acting oral nitrate. Her blood pressure today is 118/82. Thus, we will start her on a low dose of Imdur. We can titrate further if needed. I will also discuss with Dr. Claiborne Billings whether or not she will need to be restudied in the near future, either with a nuclear stress test or re look left heart catheterization. I have spoken with her oncologist, Dr. [name], over the phone in regards to further testing. She is okay with her undergoing a stress test if needed and states that there will be no interference with her Port-A-Cath. However, if this is to be done, she would like for her to undergo the stress test prior to her next chemotherapy session which is scheduled for 07/30/2013. I will discuss with Dr. Claiborne Billings whether or not we should directly proceed with further testing or if we should just see how she does with increased medical therapy.  2. Hypertension: Controlled. Blood pressure today is 118/82. We are starting 15 mg of Imdur today for angina. Patient is to followup in one to 2 weeks for repeat assessment. She has been instructed to monitor blood pressure at home closely.   PLAN: Continue current medications and add  15 mg of Imdur daily. Will discuss with her primary cardiologist, Dr. Claiborne Billings, the need for repeat ischemic eval. If he feels that she will need a stress test, we will call the patient to arrange for this to be done fairly soon. However, if not, she has been instructed to followup in 2 weeks  for reassessment. She has been instructed to seek emergent medical attention if she develops unstable angina that is not relieved by sublingual nitroglycerin. She verbalized understanding.   SIMMONS, BRITTAINYPA-C 08/22/2013 7:55 AM

## 2013-08-18 NOTE — Telephone Encounter (Signed)
Kelly [name] called stating  that her cardiologist Dr. Claiborne Billings wanted to know if she could have a nuclear stress test or the old fashion tredmil? He also wanted to be sure that Dr. [name] was ok with Isosorbide Mononitrate 30 mg q 24 hours.

## 2013-08-20 ENCOUNTER — Other Ambulatory Visit (HOSPITAL_BASED_OUTPATIENT_CLINIC_OR_DEPARTMENT_OTHER): Payer: No Typology Code available for payment source

## 2013-08-20 ENCOUNTER — Telehealth: Payer: Self-pay | Admitting: Oncology

## 2013-08-20 ENCOUNTER — Encounter: Payer: Self-pay | Admitting: Oncology

## 2013-08-20 ENCOUNTER — Ambulatory Visit (HOSPITAL_BASED_OUTPATIENT_CLINIC_OR_DEPARTMENT_OTHER): Payer: No Typology Code available for payment source | Admitting: Oncology

## 2013-08-20 VITALS — BP 124/89 | HR 103 | Temp 98.2°F | Resp 18 | Ht 67.0 in | Wt 248.7 lb

## 2013-08-20 DIAGNOSIS — D509 Iron deficiency anemia, unspecified: Secondary | ICD-10-CM

## 2013-08-20 DIAGNOSIS — C541 Malignant neoplasm of endometrium: Secondary | ICD-10-CM

## 2013-08-20 DIAGNOSIS — C549 Malignant neoplasm of corpus uteri, unspecified: Secondary | ICD-10-CM

## 2013-08-20 DIAGNOSIS — E119 Type 2 diabetes mellitus without complications: Secondary | ICD-10-CM

## 2013-08-20 DIAGNOSIS — D573 Sickle-cell trait: Secondary | ICD-10-CM

## 2013-08-20 LAB — COMPREHENSIVE METABOLIC PANEL (CC13)
ALT: 19 U/L (ref 0–55)
AST: 14 U/L (ref 5–34)
Albumin: 3.2 g/dL — ABNORMAL LOW (ref 3.5–5.0)
Alkaline Phosphatase: 171 U/L — ABNORMAL HIGH (ref 40–150)
Anion Gap: 9 mEq/L (ref 3–11)
BILIRUBIN TOTAL: 0.7 mg/dL (ref 0.20–1.20)
BUN: 7 mg/dL (ref 7.0–26.0)
CALCIUM: 8.9 mg/dL (ref 8.4–10.4)
CHLORIDE: 105 meq/L (ref 98–109)
CO2: 26 meq/L (ref 22–29)
Creatinine: 0.7 mg/dL (ref 0.6–1.1)
Glucose: 242 mg/dl — ABNORMAL HIGH (ref 70–140)
Potassium: 3.5 mEq/L (ref 3.5–5.1)
SODIUM: 140 meq/L (ref 136–145)
TOTAL PROTEIN: 7.7 g/dL (ref 6.4–8.3)

## 2013-08-20 LAB — CBC WITH DIFFERENTIAL/PLATELET
BASO%: 0.6 % (ref 0.0–2.0)
Basophils Absolute: 0 10*3/uL (ref 0.0–0.1)
EOS%: 1.1 % (ref 0.0–7.0)
Eosinophils Absolute: 0.1 10*3/uL (ref 0.0–0.5)
HCT: 34.4 % — ABNORMAL LOW (ref 34.8–46.6)
HGB: 10.7 g/dL — ABNORMAL LOW (ref 11.6–15.9)
LYMPH#: 1.5 10*3/uL (ref 0.9–3.3)
LYMPH%: 18.4 % (ref 14.0–49.7)
MCH: 20.6 pg — ABNORMAL LOW (ref 25.1–34.0)
MCHC: 31.2 g/dL — AB (ref 31.5–36.0)
MCV: 66.1 fL — ABNORMAL LOW (ref 79.5–101.0)
MONO#: 0.4 10*3/uL (ref 0.1–0.9)
MONO%: 5.2 % (ref 0.0–14.0)
NEUT#: 6 10*3/uL (ref 1.5–6.5)
NEUT%: 74.7 % (ref 38.4–76.8)
Platelets: 309 10*3/uL (ref 145–400)
RBC: 5.21 10*6/uL (ref 3.70–5.45)
RDW: 23.3 % — AB (ref 11.2–14.5)
WBC: 8 10*3/uL (ref 3.9–10.3)

## 2013-08-20 MED ORDER — FERROUS FUMARATE 325 (106 FE) MG PO TABS: ORAL_TABLET | ORAL | Status: AC

## 2013-08-20 NOTE — Telephone Encounter (Signed)
, °

## 2013-08-20 NOTE — Telephone Encounter (Signed)
Dr. [name] said that a nuclear stress test and the Isosorbide would be fine.

## 2013-08-20 NOTE — Telephone Encounter (Signed)
Told patient the medication and nuclear stress test would fine with Dr. [name].Marland Kitchen

## 2013-08-20 NOTE — Progress Notes (Signed)
OFFICE PROGRESS NOTE   08/20/2013   Physicians:Thomas Megan Mans [place], John [place], [address] [place], [place].  Patient is seen, with infant daughter, in continuing attention to IIIA endometrial carcinoma, for which she had surgery at [facility] by Dr [name] on 04-30-13 and plan was then for chemotherapy x 3 cycles, then external beam RT + brachytherapy, and 3 additional cycles of chemotherapy. Unfortunately she had acute MI on 06-17-13, a week after first taxol carboplatin chemotherapy given 06-11-13; she has had complicated course since then. Other significant medical problem is diabetes, which was not controlled at presentation.  INTERVAL HISTORY:  Patient received second cycle of chemotherapy with single agent carboplatin on 08-11-13, taxol held due to ongoing intermittent chest pain and the diabetes. She had nausea beginning day 3 for a few days, did use antiemetics and was able to keep down liquids. Blood sugars have been much better, 119-128 on metformin and glipizide. She continues with intermittent chest pressure, last severe episode having been 08-13-13; the chest pressure has improved with SL NTG x2. Bowels are moving. She is having hot flashes, very bothersome at night. She denies fever or symptoms of infection.  Cardiology plans stress test, presently scheduled a few days after next chemo is due. I have spoken now with PA for Dr Claiborne Billings, who is glad to move that evaluation sooner. Patient has samples of Brilinta from cardiology (per patient, she did NOT qualify for assistance for the Brilinta). She has not yet filled prescription for Imdur.  I had planned IV iron, as she is iron deficient from gyn bleeding and surgery (05-2013 iron 16, 4%sat) and did not tolerate ferrous sulfate when I first met her. After discussion with [facility] pharmacist, due to cardiac concerns, IV iron was held for now. We will try ferrous fumarate instead; also note poor tolerance of the po iron was before she was known  to have severe CAD.   PAC functioned well for chemotherapy.  Discussed with [facility] pharmacist during visit.   ONCOLOGIC HISTORY   Endometrial ca   05/01/2013 Initial Diagnosis Endometrial ca   05/16/2013 Surgery IIIA endometrial ca with + adnexal involement, negative nodes  Patient had long history of irregular, heavy vaginal bleeding and pelvic pain, evaluated at various locations since 2000. She was seen in [facility] ED 03-27-13 for increased pain and heavy vaginal bleeding, with CT AP showing large mass involving endometrium 12.5 x 8.0x5.2 cm and apparent left hydrosalpinx. PAP 04-03-13 had atypical squamous cells, negative high risk HPV; endometrial biopsy showed complex hyperplasia with atypia and florid papillary proliferation, ER, p53 and p16 positive. She saw Dr [name] on 04-30-13 and had robotic assisted total laparoscopic hysterectomy/BSO/bilateral pelvic and para aortic node evalustion at [facility] 05-16-2013. Pathology from [facility] [phone]) had endometrioid adenocarcinoma FIGO grade 2, invading 2 cm into myometrium where wall was 2.5 cm thick, no serosal involvement, lower uterine segment involvement present, cervical involvement present 87m depth, involvement of left fallopian tube and right ovary,lymphovascular space invasion present and extensive, all nodes negative (8 right pelvic, 6 left pelvic, 5 right periaortic, 2 left periaortic). Stains were negative for serous component. [facility], described as screaming and shaking by family, for which she was seen by neurology and medication changed to trileptal. Recommendation was 6 cycles of adjuvant carboplatin taxol chemotherapy given in split course with radiation. [facility], with necessary steroid premedication. She did not monitor blood sugars as instructed after  that chemotherapy. She had acute STEMI on 06-17-13, with cath and RCA stent by Dr Claiborne Billings.   Review of  systems as above, also: No bleeding. No change SOB with exertion. No LE swelling. No nausea now. Remainder of 10 point Review of Systems negative.  Objective:  Vital signs in last 24 hours:  BP 124/89  Pulse 103  Temp(Src) 98.2 F (36.8 C) (Oral)  Resp 18  Ht 5' 7"  (1.702 m)  Wt 248 lb 11.2 oz (112.81 kg)  BMI 38.94 kg/m2  SpO2 98%  LMP 02/24/2013 Weight is down 4 lbs. Alert, oriented and appropriate. Ambulatory without difficulty.  Alopecia  HEENT:PERRL, sclerae not icteric. Oral mucosa moist without lesions, posterior pharynx clear.  Neck supple. No JVD.  Lymphatics:no cervical,suraclavicular adenopathy Resp: clear to auscultation bilaterally and normal percussion bilaterally Cardio: regular rate and rhythm. No gallop. UR:KYHCWCB soft, nontender, not distended, no appreciable mass or organomegaly. Normally active bowel sounds. Surgical incision not remarkable. Musculoskeletal/ Extremities: without pitting edema, cords, tenderness Neuro: no peripheral neuropathy. Otherwise nonfocal Skin without rash, ecchymosis, petechiae Portacath-without erythema or tenderness  Lab Results:  Results for orders placed in visit on 08/20/13  CBC WITH DIFFERENTIAL      Result Value Ref Range   WBC 8.0  3.9 - 10.3 10e3/uL   NEUT# 6.0  1.5 - 6.5 10e3/uL   HGB 10.7 (*) 11.6 - 15.9 g/dL   HCT 34.4 (*) 34.8 - 46.6 %   Platelets 309  145 - 400 10e3/uL   MCV 66.1 (*) 79.5 - 101.0 fL   MCH 20.6 (*) 25.1 - 34.0 pg   MCHC 31.2 (*) 31.5 - 36.0 g/dL   RBC 5.21  3.70 - 5.45 10e6/uL   RDW 23.3 (*) 11.2 - 14.5 %   lymph# 1.5  0.9 - 3.3 10e3/uL   MONO# 0.4  0.1 - 0.9 10e3/uL   Eosinophils Absolute 0.1  0.0 - 0.5 10e3/uL   Basophils Absolute 0.0  0.0 - 0.1 10e3/uL   NEUT% 74.7  38.4 - 76.8 %   LYMPH% 18.4  14.0 - 49.7 %   MONO% 5.2  0.0 - 14.0 %   EOS% 1.1  0.0 - 7.0 %   BASO% 0.6  0.0 - 2.0 %  COMPREHENSIVE METABOLIC PANEL (JS28)      Result Value Ref Range   Sodium 140  136 - 145 mEq/L    Potassium 3.5  3.5 - 5.1 mEq/L   Chloride 105  98 - 109 mEq/L   CO2 26  22 - 29 mEq/L   Glucose 242 (*) 70 - 140 mg/dl   BUN 7.0  7.0 - 26.0 mg/dL   Creatinine 0.7  0.6 - 1.1 mg/dL   Total Bilirubin 0.70  0.20 - 1.20 mg/dL   Alkaline Phosphatase 171 (*) 40 - 150 U/L   AST 14  5 - 34 U/L   ALT 19  0 - 55 U/L   Total Protein 7.7  6.4 - 8.3 g/dL   Albumin 3.2 (*) 3.5 - 5.0 g/dL   Calcium 8.9  8.4 - 10.4 mg/dL   Anion Gap 9  3 - 11 mEq/L     Studies/Results:  No results found. CT angio chest 06-15-13 had suggested minimal nodular opacification LLL; CT angio chest 07-20-13 had motion artifact but no clear findings of concern there. I have discussed this with patient again today.  Medications: I have reviewed the patient's current medications. She is encouraged to fill Imdur prescription from cardiology. She will  start ferrous fumarate one daily.  DISCUSSION: I am not comfortable giving another cycle of chemotherapy with recurrent chest pain prior to planned cardiology evaluation with some stress test. We will delay cycle 3 due 09-01-13 if no resolution by then. I have asked gyn oncology to see her to review situation, with appointment made to Dr Denman George on 08-25-13.  NOTE: patient is considering moving to [place] near her father, as she needs more help with the baby than has been available in this area. I have encouraged her to let us know her plans so that medical care can be coordinated if so.  Assessment/Plan: .1.IIIA endometrial carcinoma FIGO 2: post surgery at [facility] 05-16-13, one cycle taxol + carboplatin on 06-11-13, then treatment interrupted by acute MI 06-17-13, and now one additional cycle carboplatin on 08-11-13. Due cycle 3 on 09-01-13 if cleared by cardiology. Dr Denman George of gyn oncology to see her 08-25-13 for any other recommendations. Plan had been taxane/carboplatin x3, then RT external beam + brachytherapy, then additional chemotherapy.  2.inferior STEMI 06-17-13 with RCA stent: intermittent  chest discomfort, for follow up with cardiology   3.diabetes: blood sugars much improved. Will need decadron premed if taxol used again. 4.difficult social situation: husband died ~ a month after adoption of baby Toi. Transportation and childcare difficult. Not able to work presently. She is considering move to [facility] where her father lives. 5.PAC in 6.hx seizure disorder, which fortunately has not been a problem recently, on Trileptal from  [facility] by Hgb electrophoresis at presentation  8.multifactorial anemia: is iron deficient. Did not try IV feraheme due to recent MI. Begin po ferrous fumarate.    Patient understood discussion, recommendations and plan. I will see her back before further chemo. Appreciate help from cardiology. Time spent 40 min including >50% counseling and coordination of care.   LIVESAY,LENNIS P, MD   08/20/2013, 1:35 PM

## 2013-08-21 ENCOUNTER — Other Ambulatory Visit: Payer: Self-pay | Admitting: Oncology

## 2013-08-21 ENCOUNTER — Telehealth (HOSPITAL_COMMUNITY): Payer: Self-pay | Admitting: *Deleted

## 2013-08-21 NOTE — Telephone Encounter (Signed)
Second message left on voice mail to please contact cardiac rehab.  Please contact letter sent to pt at address listed in [place].

## 2013-08-25 ENCOUNTER — Ambulatory Visit: Payer: No Typology Code available for payment source | Admitting: Gynecologic Oncology

## 2013-08-25 ENCOUNTER — Other Ambulatory Visit: Payer: Self-pay | Admitting: *Deleted

## 2013-08-25 ENCOUNTER — Encounter: Payer: Self-pay | Admitting: Cardiovascular Disease

## 2013-08-25 DIAGNOSIS — Z01810 Encounter for preprocedural cardiovascular examination: Secondary | ICD-10-CM

## 2013-08-25 DIAGNOSIS — R5381 Other malaise: Secondary | ICD-10-CM

## 2013-08-25 DIAGNOSIS — R5383 Other fatigue: Secondary | ICD-10-CM

## 2013-08-27 ENCOUNTER — Ambulatory Visit (HOSPITAL_COMMUNITY)
Admission: RE | Admit: 2013-08-27 | Payer: No Typology Code available for payment source | Source: Ambulatory Visit | Admitting: Cardiovascular Disease

## 2013-08-27 ENCOUNTER — Encounter (HOSPITAL_COMMUNITY): Admission: RE | Payer: Self-pay | Source: Ambulatory Visit

## 2013-08-27 SURGERY — LEFT HEART CATHETERIZATION WITH CORONARY ANGIOGRAM
Anesthesia: LOCAL

## 2013-08-31 ENCOUNTER — Other Ambulatory Visit: Payer: Self-pay | Admitting: Oncology

## 2013-09-01 ENCOUNTER — Ambulatory Visit: Payer: No Typology Code available for payment source | Attending: Gynecologic Oncology | Admitting: Gynecologic Oncology

## 2013-09-01 ENCOUNTER — Encounter: Payer: Self-pay | Admitting: Gynecologic Oncology

## 2013-09-01 VITALS — BP 130/86 | HR 89 | Temp 98.5°F | Resp 18 | Ht 67.0 in | Wt 251.7 lb

## 2013-09-01 DIAGNOSIS — C541 Malignant neoplasm of endometrium: Secondary | ICD-10-CM

## 2013-09-01 DIAGNOSIS — I219 Acute myocardial infarction, unspecified: Secondary | ICD-10-CM

## 2013-09-01 DIAGNOSIS — C549 Malignant neoplasm of corpus uteri, unspecified: Secondary | ICD-10-CM

## 2013-09-01 DIAGNOSIS — G4700 Insomnia, unspecified: Secondary | ICD-10-CM

## 2013-09-01 DIAGNOSIS — N951 Menopausal and female climacteric states: Secondary | ICD-10-CM

## 2013-09-01 DIAGNOSIS — N858 Other specified noninflammatory disorders of uterus: Secondary | ICD-10-CM

## 2013-09-01 MED ORDER — VENLAFAXINE HCL ER 37.5 MG PO CP24: 37.5000 mg | ORAL_CAPSULE | Freq: Every day | ORAL | Status: AC

## 2013-09-01 MED ORDER — VENLAFAXINE HCL ER 37.5 MG PO CP24: 37.5000 mg | ORAL_CAPSULE | Freq: Every day | ORAL | Status: DC

## 2013-09-01 NOTE — Patient Instructions (Signed)
We will call you with additional instructions.

## 2013-09-01 NOTE — Progress Notes (Signed)
Spoke with Dr. Bing Ree who agreed that a holding additional chemotherapy at this time am moving forward with radiation might provide this patient with some local control in the pelvis, while her cardiac issues are being addressed. The RTOG protocol called for her 50 mg /m2 of cisplatin on days 1 and 28. However her given her a cardiac status and potential difficulty with large fluid boluses we agreed to omit the cisplatin during her radiation, at least for day 1. The patient has already seen Dr. Sondra Come in consultation. We will have her see him sooner in order to plan for commencing her radiation. She should be be re evaluated by Dr. Bing Ree, after completing radiation at which time she will receive 4 cycles of carboplatin and paclitaxel if her underlying medical comorbidities allow it.

## 2013-09-01 NOTE — Progress Notes (Signed)
Office Visit  Note: Gyn-Onc   Kelly [name] 38 y.o. female  CC:  Chief Complaint  Patient presents with  . Stage III endometrial cancer  . Hot Flashes     Assessment/Plan:  This is a 38 y.o. year old with stage IIIA grade 2 endometrial cancer who is 3 months s/p robotic staging, and has commenced recommended adjuvant therapy with sandwiched chemo/radiation who presents for postoperative evaluation and discussion regarding best course of therapy. She has no clinical evidence of recurrence at this point in time. Given her recent MI and persistent chest pain which requires further coronary catheterization (diagnostic and possibly therapeutic), her chemotherapy has been held. I would agree with this. However, her extensive pelvic disease places her at high risk for both local and distant recurrence/relapse should therapy be held for too long or aborted. Therefore I am recommending considering resequencing her radiation and chemotherapy (eg beginning with pelvic and vaginal radiation, +/- 50mg /m2 cisplatin on days 1 and 28, followed by 3-4 cycles of carboplatin and paclitaxel if her underlying cardiac issues have improved such that she could tolerate multi-agent chemotherapy). Alternatively we could consider single agent carboplatin prior to radiation.  If she has more significant delays (eg 3 or more months since her last (and only) dose of chemotherapy) I would recommend repeated imaging of the abdomen and pelvis to document that she remains NED.  She is having significant menopausal symptoms with hot flashes and sleep disturbance. This is likely secondary to her surgical menopause. With untreated metastatic endometrial cancer I am reluctant to prescribe her a systemic estrogen therapy.  I have prescribed her Effexor for her hot flashes.  Donaciano Eva, MD., PhD. 09/01/2013, 4:23 PM   HPI: Kelly [name] is a 38 year old woman with a history of stage IIIa grade 2 endometrial cancer, who  has had an interrupted course of adjuvant therapy secondary to coronary artery disease and acute MI. Tony's cancer was diagnosed in March 2015. She underwent a robotic hysterectomy with bilateral salpingo-oophorectomy and lymphadenectomy in March 2015 with Dr. Ned Clines a [facility]. Intraoperative findings included bilateral hydrosalpinx and an enlarged uterus. Postoperatively the tumor was found to be deeply invasive into the myometrium and cervical stroma and involved bilateral ovaries. 0 of 20 lymph nodes were negative for metastatic disease. She was disposition to receive combination and adjuvant chemotherapy and radiation sequence in a sandwich fashion. She received her first cycle of carboplatin and paclitaxel chemotherapy on 06/11/2013. The following week she suffered a myocardial infarction and was admitted to the hospital. She was unable to present to her postoperative visit due to hospitalization. Coronary catheterization was performed and cardiac stenting was performed. Since that time she's continued to suffer chest pain concerning for angina. She is not received additional cycles of adjuvant therapy as of yet. Her last imaging of the abdomen or pelvis was in January of 2015.    Interval History: Kelly [name] is feeling generally well but does complain of intermittent mild chest pain concerning for angina. She denies vaginal bleeding pelvic pain or discomfort. She denies shortness of breath.  Social Hx:   History   Social History  . Marital Status: Widowed    Spouse Name: N/A    Number of Children: 1  . Years of Education: N/A   Occupational History  . Teacher    Social History Main Topics  . Smoking status: Never Smoker   . Smokeless tobacco: Never Used  . Alcohol Use: No  . Drug Use: No  . Sexual Activity:  Yes    Birth Control/ Protection: None   Other Topics Concern  . Not on file   Social History Narrative   As of 06/2013: Widowed 23-Mar-2013, husband died unexpectedly of MI.   Adopted  infant daughter in 02/2013.  Lives with mother and sister.      Past Surgical Hx:  Past Surgical History  Procedure Laterality Date  . Abdominal hysterectomy  05/16/13    [facility], Dr. Nancy Marus  . Cardiac catheterization  06/17/13    extensive thrombus with proximal third RCA 90% stenosis, stent placed, Dr. Shelva Majestic    Past Medical Hx:  Past Medical History  Diagnosis Date  . Obesity   . GERD (gastroesophageal reflux disease)     occasional no meds   . Endometrial cancer 05/2013    IIIA FIGO2.  Dr. Nancy Marus, Dr. Gery Pray, Dr. Lavonia Drafts  . Type II or unspecified type diabetes mellitus without mention of complication, uncontrolled   . STEMI (ST elevation myocardial infarction) 06/17/13    cardiac cath with extensive thrombus and proximal third RCA 90% stenosis, drug eluting stent placed; [facility];  Dr. Shelva Majestic  . Hypertension   . Anemia     multifactoral  . Seizures     age 41yo; tonic clonic, [facility]  . Sickle cell trait   . Hyperlipidemia   . Anxiety     Family Hx:  Family History  Problem Relation Age of Onset  . Depression Mother   . Hypertension Father   . Hypertension Sister   . Cancer Paternal Aunt     breast  . Heart disease Neg Hx   . Diabetes Sister     type 1 diabetes    Review of Systems:  Constitutional  Feels well with no mallaise. She reports severe hot flashes particularly at night and sleep disturbance. Skin No rash, sores, jaundice, itching, dryness,  Cardiovascular  + chest pain, shortness of breath, or edema  Pulmonary  No cough or wheeze.  Gastro Intestinal  No nausea, vomitting, or diarrhoea. No bright red blood per rectum, no abdominal pain, change in bowel movement, or constipation.  Genito Urinary  No frequency, urgency, dysuria,  Musculo Skeletal  No myalgia, arthralgia, joint swelling or pain  Neurologic  No weakness, numbness, change in gait,  Psychology  No depression, anxiety, insomnia.      Vitals:  Blood pressure 130/86, pulse 89, temperature 98.5 F (36.9 C), temperature source Oral, resp. rate 18, height 5\' 7"  (1.702 m), weight 251 lb 11.2 oz (114.17 kg), last menstrual period 02/24/2013.  Physical Exam: WD female in no apparent distress, with alopecia. Neck  Supple without any enlargements.  Lymph node survey. No cervical supraclavicular cervical or inguinal adenopathy Cardiovascular  Pulse normal rate, regularity and rhythm. S1 and S2 normal. Lungs  Clear to auscultation bilateraly, without wheezes/crackles/rhonchi. Good air movement.  Skin  No rash/lesions/breakdown  Psychiatry  Alert and oriented to person, place, and time  Back No CVA tenderness Abdomen  Normoactive bowel sounds, abdomen soft, non-tender and obese. Surgical  sites intact without evidence of hernia.  Genito Urinary  Vulva/vagina: Normal external female genitalia.  No lesions.   Bladder/urethra:  No lesions or masses  Vagina: Normal appearing vagina and vaginal cuff which is well-healed with no lesions or masses appreciated the cuff or beyond in the pelvis. Rectal  Good tone, no masses no cul de sac nodularity.  Extremities  No bilateral cyanosis, clubbing or edema.

## 2013-09-02 ENCOUNTER — Telehealth: Payer: Self-pay | Admitting: *Deleted

## 2013-09-02 ENCOUNTER — Other Ambulatory Visit: Payer: Self-pay | Admitting: *Deleted

## 2013-09-02 ENCOUNTER — Telehealth: Payer: Self-pay | Admitting: Cardiology

## 2013-09-02 ENCOUNTER — Encounter: Payer: Self-pay | Admitting: Cardiology

## 2013-09-02 DIAGNOSIS — I2510 Atherosclerotic heart disease of native coronary artery without angina pectoris: Secondary | ICD-10-CM

## 2013-09-02 DIAGNOSIS — E119 Type 2 diabetes mellitus without complications: Secondary | ICD-10-CM

## 2013-09-02 DIAGNOSIS — R5381 Other malaise: Secondary | ICD-10-CM

## 2013-09-02 DIAGNOSIS — C541 Malignant neoplasm of endometrium: Secondary | ICD-10-CM

## 2013-09-02 DIAGNOSIS — Z01810 Encounter for preprocedural cardiovascular examination: Secondary | ICD-10-CM

## 2013-09-02 DIAGNOSIS — R5383 Other fatigue: Secondary | ICD-10-CM

## 2013-09-02 MED ORDER — SODIUM CHLORIDE 0.9 % IV SOLN: INTRAVENOUS | Status: DC

## 2013-09-02 MED ORDER — ASPIRIN 81 MG PO CHEW: 81.0000 mg | CHEWABLE_TABLET | ORAL | Status: DC

## 2013-09-02 MED ORDER — SODIUM CHLORIDE 0.9 % IJ SOLN: 3.0000 mL | INTRAMUSCULAR | Status: DC | PRN

## 2013-09-02 NOTE — Telephone Encounter (Signed)
I have talked to this Kelly [name] and we have scheduled the Lt. Heart cath for this coming Thursday 09/04/2013 with Dr. Peter Martinique, Kelly [name]. Is aware of this and has agreed to come in early to have her labs done and CXR on admission to short stay

## 2013-09-02 NOTE — Telephone Encounter (Signed)
Spoke with patient regarding cardiac cath that is scheduled for Thursday 09/04/13 with Dr. Martinique.  Informed patient time of procedure (7:30 am) ---arrive at 5:30 am at [facility] at [facility] after midnight--may take all medications except DIABETIC MEDICATIONS.   NO METFORMIN ON Wednesday 09/03/13 OR Thursday 09/04/13.  Pre procedure blood work will be drawn upon arrival at the hospital.  Patient voiced understanding.

## 2013-09-02 NOTE — Progress Notes (Signed)
Labs Dr. [name] had ordered were released by [facility] cardiology today and sent to short stay. Re-entered CBC order. Cmet was still on list.

## 2013-09-02 NOTE — Telephone Encounter (Signed)
Attempted to call pt. And there was no answer, lmom

## 2013-09-03 ENCOUNTER — Telehealth: Payer: Self-pay | Admitting: Oncology

## 2013-09-03 ENCOUNTER — Encounter: Payer: Self-pay | Admitting: Oncology

## 2013-09-03 ENCOUNTER — Ambulatory Visit (HOSPITAL_BASED_OUTPATIENT_CLINIC_OR_DEPARTMENT_OTHER): Payer: No Typology Code available for payment source | Admitting: Oncology

## 2013-09-03 ENCOUNTER — Other Ambulatory Visit (HOSPITAL_BASED_OUTPATIENT_CLINIC_OR_DEPARTMENT_OTHER): Payer: No Typology Code available for payment source

## 2013-09-03 ENCOUNTER — Ambulatory Visit (HOSPITAL_BASED_OUTPATIENT_CLINIC_OR_DEPARTMENT_OTHER): Payer: No Typology Code available for payment source

## 2013-09-03 VITALS — BP 116/82 | HR 97 | Temp 98.2°F | Resp 18 | Ht 67.0 in | Wt 251.3 lb

## 2013-09-03 DIAGNOSIS — Z95828 Presence of other vascular implants and grafts: Secondary | ICD-10-CM

## 2013-09-03 DIAGNOSIS — C549 Malignant neoplasm of corpus uteri, unspecified: Secondary | ICD-10-CM

## 2013-09-03 DIAGNOSIS — D649 Anemia, unspecified: Secondary | ICD-10-CM

## 2013-09-03 DIAGNOSIS — C541 Malignant neoplasm of endometrium: Secondary | ICD-10-CM

## 2013-09-03 DIAGNOSIS — E119 Type 2 diabetes mellitus without complications: Secondary | ICD-10-CM

## 2013-09-03 DIAGNOSIS — R079 Chest pain, unspecified: Secondary | ICD-10-CM

## 2013-09-03 DIAGNOSIS — D573 Sickle-cell trait: Secondary | ICD-10-CM

## 2013-09-03 DIAGNOSIS — G40909 Epilepsy, unspecified, not intractable, without status epilepticus: Secondary | ICD-10-CM

## 2013-09-03 LAB — CBC WITH DIFFERENTIAL/PLATELET
BASO%: 0.7 % (ref 0.0–2.0)
Basophils Absolute: 0 10*3/uL (ref 0.0–0.1)
EOS%: 1.4 % (ref 0.0–7.0)
Eosinophils Absolute: 0.1 10*3/uL (ref 0.0–0.5)
HCT: 33.5 % — ABNORMAL LOW (ref 34.8–46.6)
HGB: 10.5 g/dL — ABNORMAL LOW (ref 11.6–15.9)
LYMPH%: 23.2 % (ref 14.0–49.7)
MCH: 21 pg — ABNORMAL LOW (ref 25.1–34.0)
MCHC: 31.4 g/dL — AB (ref 31.5–36.0)
MCV: 67 fL — AB (ref 79.5–101.0)
MONO#: 0.4 10*3/uL (ref 0.1–0.9)
MONO%: 5.9 % (ref 0.0–14.0)
NEUT#: 4.7 10*3/uL (ref 1.5–6.5)
NEUT%: 68.8 % (ref 38.4–76.8)
PLATELETS: 199 10*3/uL (ref 145–400)
RBC: 5.01 10*6/uL (ref 3.70–5.45)
RDW: 24.5 % — AB (ref 11.2–14.5)
WBC: 6.9 10*3/uL (ref 3.9–10.3)
lymph#: 1.6 10*3/uL (ref 0.9–3.3)

## 2013-09-03 LAB — COMPREHENSIVE METABOLIC PANEL (CC13)
ALK PHOS: 173 U/L — AB (ref 40–150)
ALT: 12 U/L (ref 0–55)
AST: 9 U/L (ref 5–34)
Albumin: 3.1 g/dL — ABNORMAL LOW (ref 3.5–5.0)
Anion Gap: 5 mEq/L (ref 3–11)
BILIRUBIN TOTAL: 0.54 mg/dL (ref 0.20–1.20)
BUN: 5.6 mg/dL — ABNORMAL LOW (ref 7.0–26.0)
CO2: 27 mEq/L (ref 22–29)
Calcium: 9 mg/dL (ref 8.4–10.4)
Chloride: 105 mEq/L (ref 98–109)
Creatinine: 0.7 mg/dL (ref 0.6–1.1)
Glucose: 286 mg/dl — ABNORMAL HIGH (ref 70–140)
POTASSIUM: 3.9 meq/L (ref 3.5–5.1)
SODIUM: 138 meq/L (ref 136–145)
TOTAL PROTEIN: 7.5 g/dL (ref 6.4–8.3)

## 2013-09-03 MED ORDER — HEPARIN SOD (PORK) LOCK FLUSH 100 UNIT/ML IV SOLN
500.0000 [IU] | Freq: Once | INTRAVENOUS | Status: AC
  Administered 2013-09-03: 500 [IU] via INTRAVENOUS
  Filled 2013-09-03: qty 5

## 2013-09-03 MED ORDER — SODIUM CHLORIDE 0.9 % IJ SOLN
10.0000 mL | INTRAMUSCULAR | Status: DC | PRN
  Administered 2013-09-03: 10 mL via INTRAVENOUS
  Filled 2013-09-03: qty 10

## 2013-09-03 NOTE — Telephone Encounter (Signed)
, °

## 2013-09-03 NOTE — Progress Notes (Signed)
OFFICE PROGRESS NOTE   09/03/2013   Physicians:Thomas Megan [facility], John Abram, [address] [place], [place] S[place].   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 Since surgery 04-2013, she has had one cycle of carboplatin and taxol on 06-17-13 and one cycle of single agent carboplatin on 08-11-13. She had acute MI on 06-17-13 and has not been clearly stable for resuming full chemotherapy since then. Because of recurrent chest symptoms, she had been scheduled and then rescheduled for cardiac cath, patient cancelling as she has no one to care for her infant daughter while she has that procedure. [place] worker continues to try to find assistance for child care, and we have both spoken with cardiology office re scheduling of the cath and logistics now. Patient was seen by Dr Everitt Amber on 09-01-13, fortunately no evidence of progressive cancer on her exam and recommendation that she go to radiation now, possibly with additional chemotherapy after RT completes if stable then. Dr Denman George and I agree that CDDP with requisite hydration would not be reasonable with RT now.  Otherwise, blood sugars have continued under good control, mostly 95-125. She continues with DOE and is sleeping poorly with stress and hot flashes.   She has PAC, flushed with chemo on 08-11-13.   ONCOLOGIC HISTORY   Endometrial ca   05/01/2013 Initial Diagnosis Endometrial ca   05/16/2013 Surgery IIIA endometrial ca with + adnexal involement, negative nodes   Patient had long history of irregular, heavy vaginal bleeding and pelvic pain, evaluated at various locations since 2000. She was seen in [facility] ED 03-27-13 for increased pain and heavy vaginal bleeding, with CT AP showing large mass involving endometrium 12.5 x 8.0x5.2 cm and apparent left hydrosalpinx. PAP 04-03-13 had atypical  squamous cells, negative high risk HPV; endometrial biopsy showed complex hyperplasia with atypia and florid papillary proliferation, ER, p53 and p16 positive. She saw Dr [name] on 04-30-13 and had robotic assisted total laparoscopic hysterectomy/BSO/bilateral pelvic and para aortic node evalustion at [facility] 05-16-2013. Pathology from [facility] [phone]) had endometrioid adenocarcinoma FIGO grade 2, invading 2 cm into myometrium where wall was 2.5 cm thick, no serosal involvement, lower uterine segment involvement present, cervical involvement present 17m depth, involvement of left fallopian tube and right ovary,lymphovascular space invasion present and extensive, all nodes negative (8 right pelvic, 6 left pelvic, 5 right periaortic, 2 left periaortic). Stains were negative for serous component. [facility], described as screaming and shaking by family, for which she was seen by neurology and medication changed to trileptal. Recommendation was 6 cycles of adjuvant carboplatin taxol chemotherapy given in split course with radiation. She [facility], with necessary steroid premedication. She did not monitor blood sugars as instructed after that chemotherapy. She had acute STEMI on 06-17-13, with cath and RCA stent by Dr KClaiborne Billings She had PAC by IR while hospitalized again for chest pain and with bridging off Brilinta, then second cycle of chemotherapy with single agent carboplatin on 08-11-13. Because of continued frequent chest discomfort, she has not appeared stable to resume full chemotherapy; cardiology has considered repeat cath. She was seen by Dr RDenman Georgeon 09-01-13, pelvic exam fortunately not showing recurrent disease, with recommendation that she go to radiation now, then possibly additional chemotherapy after RT if more stable by then.  Review of systems as  above, also: No nausea or vomiting. No fever or symptoms of infection. No  abdominal or pelvic pain. No LE swelling. Intermittent chest pressure and pain no worse, usually relieved with NTG. Remainder of 10 point Review of Systems negative.  Objective:  Vital signs in last 24 hours:  BP 116/82  Pulse 97  Temp(Src) 98.2 F (36.8 C) (Oral)  Resp 18  Ht 5' 7"  (1.702 m)  Wt 251 lb 4.8 oz (113.989 kg)  BMI 39.35 kg/m2  SpO2 98%  LMP 02/24/2013  Alert, oriented and appropriate. Ambulatory without assistance.  Alopecia  HEENT:PERRL, sclerae not icteric. Oral mucosa moist without lesions, posterior pharynx clear.  Neck supple. No JVD.  Lymphatics:no cervical,suraclavicular adenopathy Resp: clear to auscultation bilaterally and normal percussion bilaterally Cardio: regular rate and rhythm. No gallop. GI: abdomen obese, soft, nontender, not distended, no appreciable mass or organomegaly. Normally active bowel sounds. Surgical incision not remarkable. Musculoskeletal/ Extremities: without pitting edema, cords, tenderness Neuro: no peripheral neuropathy. Otherwise nonfocal Skin without rash, ecchymosis, petechiae Portacath-without erythema or tenderness  Lab Results:  Results for orders placed in visit on 09/03/13  CBC WITH DIFFERENTIAL      Result Value Ref Range   WBC 6.9  3.9 - 10.3 10e3/uL   NEUT# 4.7  1.5 - 6.5 10e3/uL   HGB 10.5 (*) 11.6 - 15.9 g/dL   HCT 33.5 (*) 34.8 - 46.6 %   Platelets 199  145 - 400 10e3/uL   MCV 67.0 (*) 79.5 - 101.0 fL   MCH 21.0 (*) 25.1 - 34.0 pg   MCHC 31.4 (*) 31.5 - 36.0 g/dL   RBC 5.01  3.70 - 5.45 10e6/uL   RDW 24.5 (*) 11.2 - 14.5 %   lymph# 1.6  0.9 - 3.3 10e3/uL   MONO# 0.4  0.1 - 0.9 10e3/uL   Eosinophils Absolute 0.1  0.0 - 0.5 10e3/uL   Basophils Absolute 0.0  0.0 - 0.1 10e3/uL   NEUT% 68.8  38.4 - 76.8 %   LYMPH% 23.2  14.0 - 49.7 %   MONO% 5.9  0.0 - 14.0 %   EOS% 1.4  0.0 - 7.0 %   BASO% 0.7  0.0 - 2.0 %  COMPREHENSIVE METABOLIC PANEL (LK91)      Result Value Ref Range   Sodium 138  136 - 145 mEq/L    Potassium 3.9  3.5 - 5.1 mEq/L   Chloride 105  98 - 109 mEq/L   CO2 27  22 - 29 mEq/L   Glucose 286 (*) 70 - 140 mg/dl   BUN 5.6 (*) 7.0 - 26.0 mg/dL   Creatinine 0.7  0.6 - 1.1 mg/dL   Total Bilirubin 0.54  0.20 - 1.20 mg/dL   Alkaline Phosphatase 173 (*) 40 - 150 U/L   AST 9  5 - 34 U/L   ALT 12  0 - 55 U/L   Total Protein 7.5  6.4 - 8.3 g/dL   Albumin 3.1 (*) 3.5 - 5.0 g/dL   Calcium 9.0  8.4 - 10.4 mg/dL   Anion Gap 5  3 - 11 mEq/L     Studies/Results:  No results found.  Medications: I have reviewed the patient's current medications.  DISCUSSION: at completion of visit, patient is scheduled to see cardiology PA on 6-25. [place] continues to try to find appropriate child care for anticipated cardiac cath. She has been scheduled to see Dr Sondra Come on July 9. I will see her back at least early August (coordinating  with RT schedule when that is set) with PAC flush then.  We appreciate Dr Serita Grit assistance and help from cardiology  Assessment/Plan: 1.IIIA endometrial carcinoma FIGO 2: post surgery at [facility] 05-16-13, one cycle taxol + carboplatin on 06-11-13, then treatment interrupted by acute MI 06-17-13, and now one additional cycle carboplatin on 08-11-13. As it is not clear that she is stable for full chemotherapy now, will ask Dr Sondra Come to consider radiation therapy now, and possibly will give additional chemotherapy after that completes if stable. 2.inferior STEMI 06-17-13 with RCA stent: intermittent chest discomfort, for follow up with cardiology  3.diabetes: blood sugars much improved.  4.difficult social situation: husband died ~ a month after adoption of baby Toi. Transportation and childcare difficult. Not able to work presently. She was considering move to [facility] where her father lives. [employer] officer, museum assisting 5.PAC in, which needs flush in 6-8 weeks from 08-11-13 if not otherwise used. 6.hx seizure disorder, which fortunately has not been a problem recently, on Trileptal  from Neurology at [facility] trait by Hgb electrophoresis at presentation  8.multifactorial anemia: is iron deficient. Did not try IV feraheme due to recent MI. Now on po ferrous fumarate.        LIVESAY,LENNIS P, MD   09/03/2013, 2:45 PM

## 2013-09-03 NOTE — Patient Instructions (Signed)
Dr Sondra Come, Radiation Oncology,  Thursday July 9 @ 8:30 AM, to discuss start of radiation treatments.

## 2013-09-03 NOTE — Patient Instructions (Signed)
Implanted Port Home Guide  An implanted port is a type of central line that is placed under the skin. Central lines are used to provide IV access when treatment or nutrition needs to be given through a person's veins. Implanted ports are used for long-term IV access. An implanted port may be placed because:    You need IV medicine that would be irritating to the small veins in your hands or arms.    You need long-term IV medicines, such as antibiotics.    You need IV nutrition for a long period.    You need frequent blood draws for lab tests.    You need dialysis.   Implanted ports are usually placed in the chest area, but they can also be placed in the upper arm, the abdomen, or the leg. An implanted port has two main parts:    Reservoir. The reservoir is round and will appear as a small, raised area under your skin. The reservoir is the part where a needle is inserted to give medicines or draw blood.    Catheter. The catheter is a thin, flexible tube that extends from the reservoir. The catheter is placed into a large vein. Medicine that is inserted into the reservoir goes into the catheter and then into the vein.   HOW WILL I CARE FOR MY INCISION SITE?  Do not get the incision site wet. Bathe or shower as directed by your health care Avien Taha.   HOW IS MY PORT ACCESSED?  Special steps must be taken to access the port:    Before the port is accessed, a numbing cream can be placed on the skin. This helps numb the skin over the port site.    Your health care Asma Boldon uses a sterile technique to access the port.   Your health care Chriss Mannan must put on a mask and sterile gloves.   The skin over your port is cleaned carefully with an antiseptic and allowed to dry.   The port is gently pinched between sterile gloves, and a needle is inserted into the port.   Only "non-coring" port needles should be used to access the port. Once the port is accessed, a blood return should be checked. This helps  ensure that the port is in the vein and is not clogged.    If your port needs to remain accessed for a constant infusion, a clear (transparent) bandage will be placed over the needle site. The bandage and needle will need to be changed every week, or as directed by your health care Eyla Tallon.    Keep the bandage covering the needle clean and dry. Do not get it wet. Follow your health care Brodi Nery's instructions on how to take a shower or bath while the port is accessed.    If your port does not need to stay accessed, no bandage is needed over the port.   WHAT IS FLUSHING?  Flushing helps keep the port from getting clogged. Follow your health care Caily Rakers's instructions on how and when to flush the port. Ports are usually flushed with saline solution or a medicine called heparin. The need for flushing will depend on how the port is used.    If the port is used for intermittent medicines or blood draws, the port will need to be flushed:    After medicines have been given.    After blood has been drawn.    As part of routine maintenance.    If a constant infusion is   running, the port may not need to be flushed.   HOW LONG WILL MY PORT STAY IMPLANTED?  The port can stay in for as long as your health care Gwendlyn Hanback thinks it is needed. When it is time for the port to come out, surgery will be done to remove it. The procedure is similar to the one performed when the port was put in.   WHEN SHOULD I SEEK IMMEDIATE MEDICAL CARE?  When you have an implanted port, you should seek immediate medical care if:    You notice a bad smell coming from the incision site.    You have swelling, redness, or drainage at the incision site.    You have more swelling or pain at the port site or the surrounding area.    You have a fever that is not controlled with medicine.  Document Released: 02/27/2005 Document Revised: 12/18/2012 Document Reviewed: 11/04/2012  ExitCare Patient Information 2015 [employer]. This  information is not intended to replace advice given to you by your health care Stepheny Canal. Make sure you discuss any questions you have with your health care Takao Lizer.

## 2013-09-04 ENCOUNTER — Ambulatory Visit (HOSPITAL_COMMUNITY)
Admission: RE | Admit: 2013-09-04 | Payer: No Typology Code available for payment source | Source: Ambulatory Visit | Admitting: Cardiology

## 2013-09-04 ENCOUNTER — Encounter (HOSPITAL_COMMUNITY): Admission: RE | Payer: Self-pay | Source: Ambulatory Visit

## 2013-09-04 ENCOUNTER — Ambulatory Visit: Payer: No Typology Code available for payment source | Admitting: Cardiology

## 2013-09-04 SURGERY — LEFT HEART CATHETERIZATION WITH CORONARY ANGIOGRAM
Anesthesia: LOCAL

## 2013-09-05 ENCOUNTER — Other Ambulatory Visit: Payer: Self-pay | Admitting: Cardiology

## 2013-09-05 ENCOUNTER — Encounter: Payer: Self-pay | Admitting: Cardiology

## 2013-09-05 ENCOUNTER — Ambulatory Visit (INDEPENDENT_AMBULATORY_CARE_PROVIDER_SITE_OTHER): Payer: No Typology Code available for payment source | Admitting: Cardiology

## 2013-09-05 ENCOUNTER — Telehealth: Payer: Self-pay

## 2013-09-05 VITALS — BP 112/68 | HR 68 | Ht 67.0 in | Wt 253.5 lb

## 2013-09-05 DIAGNOSIS — I209 Angina pectoris, unspecified: Secondary | ICD-10-CM

## 2013-09-05 MED ORDER — ISOSORBIDE MONONITRATE ER 30 MG PO TB24: 30.0000 mg | ORAL_TABLET | Freq: Every day | ORAL | Status: AC

## 2013-09-05 NOTE — Telephone Encounter (Signed)
Kelly [name] called  Stating that her cardiologist is dont going to do a cardiac catheterization at this time.   Does this affect her oncology treatment plan with radiation or resuming chemotherapy?

## 2013-09-05 NOTE — Patient Instructions (Addendum)
Increase Isosorbide to 30mg  ( 1 tablet ) daily.  Your physician recommends that you schedule a follow-up appointment in: WITH DR. Claiborne Billings AT HIS NEXT AVAILABLE IN 6-8 WEEKS.

## 2013-09-05 NOTE — Telephone Encounter (Signed)
Told Kelly [name] that Dr. [name] said that the radiation now is still the plan for her endometrial cancer.  Patient verbalized understanding.

## 2013-09-05 NOTE — Progress Notes (Signed)
Patient ID: Kelly [name], female   DOB: [name], 38 y.o.   MRN: [id number]    09/05/2013 Kelly [name]   Kelly [name]  [id number]  Primary Schuyler, Sawyer, PA-C Primary Cardiologist: Dr. Claiborne Billings     Oncologist: Dr. [name]  HPI:  [name] is a 38 year old, AA female, who was recently diagnosed with endometrial carcinoma, s/p total hysterectomy on 05/16/13. Chemotherapy treatments were initiated~ first part of April. She also has a history of diabetes mellitus, morbid obesity and a long-standing history of hypertension. She presented to [facility] on 06/15/13, several days after the start of chemo, with a complaint of severe chest discomfort. EKG revealed inferior ST segment elevations. Troponin was positive. CODE STEMI was activated and she was taken to the lab for urgent catheterization, performed by Dr. Claiborne Billings. She was found to have a proximal RCA thrombus, felt to be due to an ulcerative plaque. This was successfully treated with PCI utilizing a DES. Left ventriculography revealed an ejection fraction of 55%. There was mild mid inferior hypocontractility. She was placed on DAPT with ASA + Brilinta, as well as Lopressor and Lipitor.  I evaluated her, for the first time, on 07/04/13 for post hospital f/u. At that time, she stated that she had been doing well from a cardiac standpoint. She denied recurrent chest pain. No SOB. She was continued on her medications, however her beta blocker was changed from Lopressor to Coreg, due to cost, as she currently does not have insurance and Coreg is on the 4 dollar prescription list at [employer] and [employer]. Her oncologist, Dr. [name], requested that she be cleared from a cardiac standpoint to resume chemotherapy treatments. Her chemotherapy agents were reviewed by Dr. Sallyanne Kuster. It was felt that these were not cardiotoxic and she was cleared to resume chemotherapy.   Recently, she was scheduled to undergo a procedure at [facility] for insertion of a port-a-cath on 07/23/2013. The plan was to have her stop Brilinta and she was to be admitted on 07/21/2013 for Aggrastat bridging. However, she developed chest pressure the evening prior to her planned admission and presented to the [facility] ER.  In the ER she had a negative CTA. Her EKG was without overt ST elevation. Heparin was started in the ER along with nitroglycerin. She was continued on her home medications including aspirin Coreg and atorvastatin.  Cardiac enzymes were cycled and were negative x3. It was felt that her chest pain was noncardiac. She ultimately underwent port-a-cath placement and afterwards was continued on Brilinta. She was discharged home on 07/22/2013.  I reevaluated her again for post hospital followup two weeks ago. At that time, she complained of recurrent chest pain. The chest discomfort was similar to the chest discomfort she experienced with her MI, however the pain was less severe. It was described as a substernal pressure-like sensation. There was no particular pattern to her discomfort. It at times would occur at rest but also with light exertion. Her symptoms were relieved with sublingual nitroglycerin. She also noted occasionl mild dyspnea, but denied nausea, vomiting, diaphoresis, syncope/near-syncope. She stated that she had been fully compliant with her medications, including dual antiplatelet therapy, BB and statin therapy. At that time, I opted to start her on a long acting oral nitrate. I placed her on 15 mg of Imdur daily. I opted to start her on a lower dose, as a trial, to avoid hypotension. She was instructed to followup in in 2 weeks for reassessment. The plan was to pursue an  ischemic evaluation, either a nuclear stress test or a left heart catheterization if she continued to have significant symptoms.  She presents back to clinic today for followup. She states that she has been fully compliant with her medications including her newly  prescribed Imdur. She notes significant improvement in her symptoms. She denies any recurrent chest discomfort since starting the medication. She does note occasional dizziness but states that it is related to positional changes only. She denies syncope/near-syncope. She continues to have mild dyspnea with moderate exertion. She denies any resting dyspnea. No orthopnea PND or lower extremity edema.  Current Outpatient Prescriptions  Medication Sig Dispense Refill  . aspirin EC 81 MG EC tablet Take 1 tablet (81 mg total) by mouth daily.  30 tablet  0  . atorvastatin (LIPITOR) 80 MG tablet Take 1 tablet (80 mg total) by mouth daily at 6 PM.  30 tablet  3  . carvedilol (COREG) 12.5 MG tablet Take 1 tablet (12.5 mg total) by mouth 2 (two) times daily.  180 tablet  3  . docusate sodium (STOOL SOFTENER) 100 MG capsule Take 300 mg by mouth 2 (two) times daily.       . ferrous fumarate (HEMOCYTE - 106 MG FE) 325 (106 FE) MG TABS tablet Take 1 tab daily on an empty stomach with OJ.  30 each  2  . glipiZIDE (GLUCOTROL) 5 MG tablet Take 1 tablet (5 mg total) by mouth 2 (two) times daily before a meal.  60 tablet  5  . insulin aspart (NOVOLOG) 100 UNIT/ML injection As directed      . Insulin Syringes, Disposable, U-100 1 ML MISC Use as directed with insulin  100 each  0  . isosorbide mononitrate (IMDUR) 30 MG 24 hr tablet Take 0.5 tablets (15 mg total) by mouth daily.  30 tablet  5  . lidocaine-prilocaine (EMLA) cream Apply 1-2 hours prior to Portacath access as directed.  30 g  1  . LORazepam (ATIVAN) 1 MG tablet Place 1/2- 1 tab under tongue or swallow every 6 hr as needed for nausea.  Take 1 tab the night of chemo.  Will make drowsy  20 tablet  0  . metFORMIN (GLUCOPHAGE) 1000 MG tablet TAKE ONE TABLET BY MOUTH TWICE DAILY WITH MEALS  60 tablet  0  . metoprolol tartrate (LOPRESSOR) 25 MG tablet Take 1 tablet (25 mg total) by mouth 2 (two) times daily.  60 tablet  3  . nitroGLYCERIN (NITROSTAT) 0.4 MG SL  tablet Place 1 tablet (0.4 mg total) under the tongue every 5 (five) minutes as needed for chest pain.  30 tablet  1  . ondansetron (ZOFRAN) 8 MG tablet Take 1 tablet (8 mg total) by mouth every 8 (eight) hours as needed for nausea or vomiting (Will not make you drowsy).  30 tablet  1  . OXcarbazepine (TRILEPTAL) 150 MG tablet Take 150 mg by mouth daily as needed (epilepsy).       Marland Kitchen oxyCODONE-acetaminophen (PERCOCET/ROXICET) 5-325 MG per tablet Take by mouth every 4 (four) hours as needed for severe pain.      . pantoprazole (PROTONIX) 40 MG tablet Take 1 tablet (40 mg total) by mouth daily.  30 tablet  2  . prochlorperazine (COMPAZINE) 10 MG tablet Take 1 tablet (10 mg total) by mouth every 6 (six) hours as needed for nausea or vomiting.  20 tablet  0  . ticagrelor (BRILINTA) 90 MG TABS tablet Take 1 tablet (90 mg total) by mouth  2 (two) times daily.  64 tablet  0  . venlafaxine XR (EFFEXOR-XR) 37.5 MG 24 hr capsule Take 1 capsule (37.5 mg total) by mouth daily with breakfast.  30 capsule  3  . vitamin C (ASCORBIC ACID) 500 MG tablet Take 1 tab daily with iron on an empty stomach.  30 tablet  3   No current facility-administered medications for this visit.    Allergies  Allergen Reactions  . Amoxicillin Nausea And Vomiting, Swelling and Rash    Rash, vomiting, throat closing Rash, vomiting, throat closing  . Flagyl [Metronidazole] Nausea And Vomiting    04/30/13-Per pt she is unable to tolerate this medication, causes rash 04/30/13-Per pt she is unable to tolerate this medication, causes rash  . Latex   . Adhesive [Tape] Rash and Dermatitis    "takes skin off" "takes skin off"    History   Social History  . Marital Status: Widowed    Spouse Name: N/A    Number of Children: 1  . Years of Education: N/A   Occupational History  . Teacher    Social History Main Topics  . Smoking status: Never Smoker   . Smokeless tobacco: Never Used  . Alcohol Use: No  . Drug Use: No  . Sexual  Activity: Yes    Birth Control/ Protection: None   Other Topics Concern  . Not on file   Social History Narrative   As of 06/2013: Widowed 03-23-2013, husband died unexpectedly of MI.   Adopted infant daughter in 02/2013.  Lives with mother and sister.       Review of Systems: General: negative for chills, fever, night sweats or weight changes.  Cardiovascular: negative for chest pain, dyspnea on exertion, edema, orthopnea, palpitations, paroxysmal nocturnal dyspnea or shortness of breath Dermatological: negative for rash Respiratory: negative for cough or wheezing Urologic: negative for hematuria Abdominal: negative for nausea, vomiting, diarrhea, bright red blood per rectum, melena, or hematemesis Neurologic: negative for visual changes, syncope, or dizziness All other systems with aspirin plus Brilintad and are otherwise negative except as noted above.    Blood pressure 112/68, pulse 68, height 5\' 7"  (1.702 m), weight 253 lb 8 oz (114.987 kg), last menstrual period 02/24/2013.  General appearance: alert, cooperative, no distress and moderately obese Neck: no carotid bruit and no JVD Lungs: clear to auscultation bilaterally Heart: regular rate and rhythm, S1, S2 normal, no murmur, click, rub or gallop Extremities: no LEE Pulses: 2+ and symmetric Skin: warm and dry Neurologic: Grossly normal  EKG not performed  ASSESSMENT AND PLAN:   1. CAD: Status post acute MI, 2 months ago, resulting in PCI plus drug-eluting stent to the RCA. She denies any recurrent chest pain since the initiation of low dose Imdur. She does however continue to have mild DOE with moderate to heavy physical activities. She denies any resting dyspnea and no dyspnea with minimal exertion. I have discussed the situation with Dr. Claiborne Billings. He recommended further increasing her Imdur to 30 mg daily. She is to continue dual antiplatelet therapy with aspirin and Brilinta as well as her beta blocker and statin. If she has any  recurrent chest pain or if she develops any significant resting dyspnea, we will need to reevaluate her for potential look left heart catheterization.   2. Hypertension: Controlled. Blood pressure today is 112/68. We are further increasing her Imdur to 30 mg daily. She is also on Coreg. She has been instructed to monitor her blood pressure at home closely.  She is to notify our office if she develops any symptoms of hypotension. If she cannot fully tolerate 30 mg of Imdur, we will reduce her dose  back to 15 mg.   PLAN: Continue current medications and increase Imdur to 30 mg daily. She has been instructed to notify our office immediately if she has any recurrent chest discomfort or if her dyspnea worsens. If this occurs, we will need to pursue a left heart catheterization to redefine her anatomy. She verbalized understanding.   [place], [place] 09/05/2013 8:17 AM

## 2013-09-08 NOTE — Progress Notes (Addendum)
GYN Location of Tumor / Histology: Endometrial carcinoma   Patient presented with along history of irregular, heavy vaginal bleeding and pelvic pain for 15 years.   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  Depth of invasion into cervix is 6 mm in a wall thickness of 10 mm (60%)  (anterior endocervix, A3).  Adnexal involvement: present involving left fallopian tube and right ovary  (see specimens B and E)  Other involved sites: not applicable  Cervical/vaginal margin and distance: Negative. Tumor is 2.3 cm from  the external os by gross measurement.  Lymphovascular space invasion: present and extensive including large vessel  invasion  B: Fallopian tube, right, salpingectomy and left fallopian tube and ovary,  left, salpingo-oophorotomy  - Positive for adenocarcinoma involving left fallopian tube; background severe  chronic salpingitis, hydrosalpinx, adhesions and calcifications  - Left ovary with adhesions, endosalpingiosis and polycystic follicles, negative  for malignancy  - Right fallopian tube with adhesions, cautery artifact and cystic [place]  rests; no malignancy identified  - Lymphovascular invasion seen in the left fallopian tube  C: Lymph nodes, right pelvic, regional resection  - Eight lymph nodes negative for metastatic carcinoma (0/8)  D:  Lymph nodes, left pelvic, regional resection  - Six lymph nodes, negative for metastatic carcinoma (0/6)  E: Ovary, right, oophorectomy  - Positive for adenocarcinoma involving a microscopic surface focus  - Background polycystic ovary with adhesions  F: Lymph nodes, right periaortic, biopsy  - Four lymph nodes with cautery artifact, negative for malignancy (0/4)  G: Lymph nodes, left periaortic, biopsy  - Two lymph nodes, negative for malignancy (0/2)   Comment:  The tumor shows a predominantly endometrioid cytology with extensive papillary  architecture.   Past/Anticipated interventions by Gyn/Onc surgery, if any: post robotic assisted laparoscopic TAH BSO and bilateral pelvic and periaortic node evaluation by Dr Nancy Marus at [facility] 05-16-2013   Past/Anticipated interventions by medical oncology, if any: Per Dr. Mariana Kaufman note from 09/03/13 - "IIIA endometrial carcinoma FIGO 2: post surgery at [facility] 05-16-13, one cycle taxol + carboplatin on 06-11-13, then treatment interrupted by acute MI 06-17-13, and now one additional cycle carboplatin on 08-11-13. As it is not clear that she is stable for full chemotherapy now, will ask Dr Sondra Come to consider radiation therapy now, and possibly will give additional chemotherapy after that completes if stable."  Weight changes, if any: lost 10 lbs since diagnosis   Bowel/Bladder complaints, if any: constipation - takes miralax and colace daily   Nausea/Vomiting, if any: has nausea often, takes zofran and compazine prn.  Pain issues, if any: no  SAFETY ISSUES:  Prior radiation? no  Pacemaker/ICD? no  Possible current pregnancy? no  Is the patient on methotrexate? No  Current Complaints / other details: Patient had acute inferior STEMI 06-17-13: cath with RCA thrombectomy and stent. Has a 75 month old daughter.  She has completed 2 rounds of chemotherapy.  Lauren, CSW is trying to arrange transportation for  the patient and is needing to know how long her  treatments will be and for how long.

## 2013-09-11 ENCOUNTER — Ambulatory Visit
Admission: RE | Admit: 2013-09-11 | Discharge: 2013-09-11 | Disposition: A | Discharge status: Home / Self Care | Payer: Self-pay | Source: Ambulatory Visit | Attending: Radiation Oncology | Admitting: Radiation Oncology

## 2013-09-11 ENCOUNTER — Encounter: Payer: Self-pay | Admitting: Radiation Oncology

## 2013-09-11 VITALS — BP 145/85 | HR 90 | Temp 98.9°F | Ht 67.0 in | Wt 250.2 lb

## 2013-09-11 DIAGNOSIS — N858 Other specified noninflammatory disorders of uterus: Secondary | ICD-10-CM

## 2013-09-11 DIAGNOSIS — K5900 Constipation, unspecified: Secondary | ICD-10-CM | POA: Insufficient documentation

## 2013-09-11 DIAGNOSIS — Z510 Encounter for antineoplastic radiation therapy: Secondary | ICD-10-CM | POA: Insufficient documentation

## 2013-09-11 DIAGNOSIS — R197 Diarrhea, unspecified: Secondary | ICD-10-CM | POA: Insufficient documentation

## 2013-09-11 DIAGNOSIS — C541 Malignant neoplasm of endometrium: Secondary | ICD-10-CM

## 2013-09-11 DIAGNOSIS — R630 Anorexia: Secondary | ICD-10-CM | POA: Insufficient documentation

## 2013-09-11 DIAGNOSIS — Z7982 Long term (current) use of aspirin: Secondary | ICD-10-CM | POA: Insufficient documentation

## 2013-09-11 DIAGNOSIS — I252 Old myocardial infarction: Secondary | ICD-10-CM | POA: Insufficient documentation

## 2013-09-11 DIAGNOSIS — R5383 Other fatigue: Secondary | ICD-10-CM

## 2013-09-11 DIAGNOSIS — R42 Dizziness and giddiness: Secondary | ICD-10-CM | POA: Insufficient documentation

## 2013-09-11 DIAGNOSIS — R112 Nausea with vomiting, unspecified: Secondary | ICD-10-CM | POA: Insufficient documentation

## 2013-09-11 DIAGNOSIS — C549 Malignant neoplasm of corpus uteri, unspecified: Secondary | ICD-10-CM | POA: Insufficient documentation

## 2013-09-11 DIAGNOSIS — R350 Frequency of micturition: Secondary | ICD-10-CM | POA: Insufficient documentation

## 2013-09-11 DIAGNOSIS — K649 Unspecified hemorrhoids: Secondary | ICD-10-CM | POA: Insufficient documentation

## 2013-09-11 DIAGNOSIS — R5381 Other malaise: Secondary | ICD-10-CM | POA: Insufficient documentation

## 2013-09-11 NOTE — Progress Notes (Signed)
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  The patient received one cycle of carboplatin and Taxol on 06/17/2013. She received single agent carbo platinum on June 1. Management has been complicated by an acute MI April 7. She has had recurrent chest symptoms and after discussion with gynecologic oncology and cardiology,  Dr. [name] decided to hold off on additional chemotherapy at this time and  to proceed with her pelvic radiation therapy followed by additional chemotherapy later date if reasonable given her cardiac history.  The patient denies any pelvic pain vaginal bleeding urination difficulties or bowel complaints. She's had some problems with constipation.                             ALLERGIES:  is allergic to amoxicillin; flagyl; latex; and adhesive.  Meds: Current Outpatient Prescriptions  Medication Sig Dispense Refill  . aspirin EC 81 MG EC tablet Take 1 tablet (81 mg total) by mouth daily.  30 tablet  0  . atorvastatin (LIPITOR) 80 MG tablet Take 1 tablet (80 mg total) by mouth daily at 6 PM.  30 tablet  3  . carvedilol (COREG) 12.5 MG tablet Take 1 tablet (12.5 mg total) by mouth 2 (two) times daily.  180 tablet  3  . docusate sodium (STOOL SOFTENER) 100 MG capsule Take 300 mg by mouth 2 (two) times daily.       . ferrous fumarate (HEMOCYTE - 106 MG FE) 325 (106 FE) MG TABS tablet Take 1 tab daily on an empty  stomach with OJ.  30 each  2  . glipiZIDE (GLUCOTROL) 5 MG tablet Take 1 tablet (5 mg total) by mouth 2 (two) times daily before a meal.  60 tablet  5  . insulin aspart (NOVOLOG) 100 UNIT/ML injection As directed      . Insulin Syringes, Disposable, U-100 1 ML MISC Use as directed with insulin  100 each  0  . isosorbide mononitrate (IMDUR) 30 MG 24 hr tablet Take 1 tablet (30 mg total) by mouth daily.  30 tablet  5  . lidocaine-prilocaine (EMLA) cream Apply 1-2 hours prior to Portacath access as directed.  30 g  1  . LORazepam (ATIVAN) 1 MG tablet Place 1/2- 1 tab under tongue or swallow every 6 hr as needed for nausea.  Take 1 tab the night of chemo.  Will make drowsy  20 tablet  0  . metFORMIN (GLUCOPHAGE) 1000 MG tablet TAKE ONE TABLET BY MOUTH TWICE DAILY WITH MEALS  60 tablet  0  . nitroGLYCERIN (NITROSTAT) 0.4 MG SL tablet Place 1 tablet (0.4 mg total) under the tongue every 5 (five) minutes as needed for chest pain.  30 tablet  1  . ondansetron (ZOFRAN) 8 MG tablet Take 1 tablet (8 mg total) by mouth every 8 (eight) hours as needed for nausea or vomiting (Will  not make you drowsy).  30 tablet  1  . OXcarbazepine (TRILEPTAL) 150 MG tablet Take 150 mg by mouth daily as needed (epilepsy).       . pantoprazole (PROTONIX) 40 MG tablet Take 1 tablet (40 mg total) by mouth daily.  30 tablet  2  . prochlorperazine (COMPAZINE) 10 MG tablet Take 1 tablet (10 mg total) by mouth every 6 (six) hours as needed for nausea or vomiting.  20 tablet  0  . ticagrelor (BRILINTA) 90 MG TABS tablet Take 1 tablet (90 mg total) by mouth 2 (two) times daily.  64 tablet  0  . venlafaxine XR (EFFEXOR-XR) 37.5 MG 24 hr capsule Take 1 capsule (37.5 mg total) by mouth daily with breakfast.  30 capsule  3  . vitamin C (ASCORBIC ACID) 500 MG tablet Take 1 tab daily with iron on an empty stomach.  30 tablet  3  . oxyCODONE-acetaminophen (PERCOCET/ROXICET) 5-325 MG per tablet Take by mouth every 4 (four) hours as needed for  severe pain.       No current facility-administered medications for this encounter.    Physical Findings: The patient is in no acute distress. Patient is alert and oriented.  height is 5\' 7"  (1.702 m) and weight is 250 lb 3.2 oz (113.49 kg). Her oral temperature is 98.9 F (37.2 C). Her blood pressure is 145/85 and her pulse is 90. Marland Kitchen  No palpable cervical or supraclavicular adenopathy. The lungs are clear to auscultation. The heart has a regular rhythm and rate. The abdomen is soft and non-tender with normal bowel sounds. A pelvic exam is not performed in light of recent exam by gynecologic oncology.  No evidence of recurrence within the vaginal vault was noted on this recent exam.  Lab Findings: Lab Results  Component Value Date   WBC 6.9 09/03/2013   HGB 10.5* 09/03/2013   HCT 33.5* 09/03/2013   MCV 67.0* 09/03/2013   PLT 199 09/03/2013     Radiographic Findings: No results found.  Impression: Stage IIIa Endometrial cancer. Patient will now proceed with radiation therapy as above. I discussed the treatment course side effects and potential toxicities of pelvic and vaginal brachytherapy with the patient. She appears to understand and wishes to proceed with planned course of treatment.  Plan:  Simulation and planning July 7 with treatments to begin the week of July 13. She will receive 45 gray to the pelvis using intensity modulated radiation therapy. She will then receive intracavitary brachy therapy treatments directed at the proximal vagina using iridium 192.  ____________________________________ Blair Promise, MD

## 2013-09-11 NOTE — Progress Notes (Signed)
Please see the Nurse Progress Note in the MD Initial Consult Encounter for this patient. 

## 2013-09-11 NOTE — Progress Notes (Signed)
Advised patient not to take her metformin before her CT SIM on Tuesday because she will be receiving IV contrast.  Also advised her that she will not be able to take her metformin for 48 hours after receiving the dye and that she will need to have labs drawn on Friday. If the labs are OK she can resume her metformin on Friday afternoon.  Patient verbalized agreement that she will not take her metformin on Tuesday morning before CT SIM.

## 2013-09-16 ENCOUNTER — Telehealth: Payer: Self-pay

## 2013-09-16 ENCOUNTER — Ambulatory Visit
Admission: RE | Admit: 2013-09-16 | Discharge: 2013-09-16 | Disposition: A | Discharge status: Home / Self Care | Payer: Self-pay | Source: Ambulatory Visit | Attending: Radiation Oncology | Admitting: Radiation Oncology

## 2013-09-16 ENCOUNTER — Ambulatory Visit: Admission: RE | Admit: 2013-09-16 | Payer: Self-pay | Source: Ambulatory Visit

## 2013-09-16 DIAGNOSIS — C541 Malignant neoplasm of endometrium: Secondary | ICD-10-CM

## 2013-09-16 NOTE — Telephone Encounter (Signed)
Message copied by Baruch Merl on Tue Sep 16, 2013  4:49 PM ------      Message from: Gordy Levan      Created: Sun Sep 07, 2013  9:56 PM       When RT schedule gets set, please move my apt from 8-12 to earlier in August coordinating with RT times,  with [facility] flush/ labs from [facility].      PAC last flushed 6-1, I believe      Thanks      Cc LA, TH ------

## 2013-09-17 NOTE — Telephone Encounter (Signed)
Left a message in cell phone voice mail regarding appointment changes dates and times  From 10-22-13 to 10-15-13.

## 2013-09-18 ENCOUNTER — Ambulatory Visit: Payer: No Typology Code available for payment source

## 2013-09-18 ENCOUNTER — Ambulatory Visit: Payer: No Typology Code available for payment source | Admitting: Radiation Oncology

## 2013-09-18 NOTE — Progress Notes (Signed)
  Radiation Oncology         [phone]________________________________  Name: Kelly [name] MRN: [id number]  Date: 09/16/2013  DOB: 1975/05/12  SIMULATION AND TREATMENT PLANNING NOTE  DIAGNOSIS:  Endometrial ca   Primary site: Corpus Uteri - Carcinoma   Staging method: AJCC 7th Edition   Clinical: Stage IIIA (T3a, N0, M0)   Pathologic: Stage IIIA (T3a, N0, M0)   Summary: Stage IIIA (T3a, N0, M0)   NARRATIVE:  The patient was brought to the [facility].  Identity was confirmed.  All relevant records and images related to the planned course of therapy were reviewed.  The patient freely provided informed written consent to proceed with treatment after reviewing the details related to the planned course of therapy. The consent form was witnessed and verified by the simulation staff.  Then, the patient was set-up in a stable reproducible  supine position for radiation therapy.  CT images were obtained.  Surface markings were placed.  The CT images were loaded into the planning software.  Then the target and avoidance structures were contoured.  Treatment planning then occurred.  The radiation prescription was entered and confirmed.  Then, I designed and supervised the construction of a total of 1 medically necessary complex treatment devices.  I have requested : Intensity Modulated Radiotherapy (IMRT) is medically necessary for this case for the following reason:  Small bowel sparing..  I have ordered:dose calc.  PLAN:  The patient will receive 45 Gy in 25 fractions,  followed by intracavitary brachii therapy treatments  ________________________________  -----------------------------------  Blair Promise, PhD, MD

## 2013-09-22 ENCOUNTER — Telehealth: Payer: Self-pay | Admitting: Cardiovascular Disease

## 2013-09-22 MED ORDER — CLOPIDOGREL BISULFATE 75 MG PO TABS: 75.0000 mg | ORAL_TABLET | Freq: Every day | ORAL | Status: AC

## 2013-09-22 NOTE — Telephone Encounter (Signed)
Spoke with [employer]. OK to call in plavix 75mg . Informed patient of that and advised that she pick this med up and call on 7/14 to get samples of Brilinta. Patient agreed with plan and voiced understanding.

## 2013-09-22 NOTE — Telephone Encounter (Signed)
Patient notified no samples are available. Advised that she call [facility] office (number provided).   Patient cannot afford and said she will be without if she cannot get samples.

## 2013-09-22 NOTE — Telephone Encounter (Signed)
Is asking if she can get some samples of Brilinta 90mg .. Please Call  Thanks

## 2013-09-22 NOTE — Telephone Encounter (Signed)
Pt is out of Brilinta,can you prescribed something else for her?. She can not afford the Brilinta,

## 2013-09-25 ENCOUNTER — Ambulatory Visit: Payer: Self-pay | Admitting: Radiation Oncology

## 2013-09-26 ENCOUNTER — Ambulatory Visit: Payer: Self-pay

## 2013-09-29 ENCOUNTER — Ambulatory Visit: Payer: Self-pay | Admitting: Radiation Oncology

## 2013-09-30 ENCOUNTER — Ambulatory Visit: Payer: Self-pay

## 2013-09-30 ENCOUNTER — Ambulatory Visit
Admission: RE | Admit: 2013-09-30 | Discharge: 2013-09-30 | Disposition: A | Discharge status: Home / Self Care | Payer: Self-pay | Source: Ambulatory Visit | Attending: Radiation Oncology | Admitting: Radiation Oncology

## 2013-09-30 ENCOUNTER — Encounter: Payer: Self-pay | Admitting: Radiation Oncology

## 2013-09-30 VITALS — BP 100/69 | HR 91 | Temp 98.2°F | Resp 20 | Wt 252.6 lb

## 2013-09-30 DIAGNOSIS — C541 Malignant neoplasm of endometrium: Secondary | ICD-10-CM

## 2013-09-30 NOTE — Progress Notes (Signed)
Weekly rad txs 1st tx pelvis/endometrial completed,  No bleeding, no discharge, no appetite, due to chemotherapy , gave rad book and gave pages turned down for her to read,Karen will do patient education tomorrow, no c/o pain or nausea  4:02 PM

## 2013-09-30 NOTE — Progress Notes (Signed)
  Radiation Oncology         [phone] ________________________________  Name: Kelly [name] MRN: [id number]  Date: 09/30/2013  DOB: [birth year]  Weekly Radiation Therapy Management  Endometrial ca   Primary site: Corpus Uteri - Carcinoma   Staging method: AJCC 7th Edition   Clinical: Stage IIIA (T3a, N0, M0)   Pathologic: Stage IIIA (T3a, N0, M0)   Summary: Stage IIIA (T3a, N0, M0)   Current Dose: 1.8 Gy     Planned Dose:  45 + Gy  Narrative . . . . . . . . The patient presents for routine under treatment assessment.                                   The patient is without complaint.                                 Set-up films were reviewed.                                 The chart was checked. Physical Findings. . .  weight is 252 lb 9.6 oz (114.579 kg). Her oral temperature is 98.2 F (36.8 C). Her blood pressure is 100/69 and her pulse is 91. Her respiration is 20. . The lungs are clear. The heart has a regular rhythm and rate.  The abdomen is soft and nontender with normal bowel sounds. Impression . . . . . . . The patient is tolerating radiation. Plan . . . . . . . . . . . . Continue treatment as planned.  ________________________________   Blair Promise, PhD, MD

## 2013-10-01 ENCOUNTER — Ambulatory Visit
Admission: RE | Admit: 2013-10-01 | Discharge: 2013-10-01 | Disposition: A | Discharge status: Home / Self Care | Payer: Self-pay | Source: Ambulatory Visit | Attending: Radiation Oncology | Admitting: Radiation Oncology

## 2013-10-02 ENCOUNTER — Ambulatory Visit
Admission: RE | Admit: 2013-10-02 | Discharge: 2013-10-02 | Disposition: A | Discharge status: Home / Self Care | Payer: Self-pay | Source: Ambulatory Visit | Attending: Radiation Oncology | Admitting: Radiation Oncology

## 2013-10-02 DIAGNOSIS — C541 Malignant neoplasm of endometrium: Secondary | ICD-10-CM

## 2013-10-02 MED ORDER — BIAFINE EX EMUL
Freq: Two times a day (BID) | CUTANEOUS | Status: DC
  Administered 2013-10-02: 15:00:00 via TOPICAL

## 2013-10-02 NOTE — Progress Notes (Signed)
Kelly [name] came to the clinic for patient education.  Kelly [name] stated she has noticed that her skin is irritated on her lower abdomen.  Assessed area and no skin irritation noted.  She reported that she used Vaseline on the area last night.  Advised her not to put Vaseline on the treatment area.  She was given biafine cream and was instructed to apply it in the morning and at bedtime at least 4 hours before treatment time.  Also dicussed management of fatigue, nausea, diarrhea and bladder changes.  She was given a sitz bath and was instructed on how to use it.  Advised her to call with any questions or concerns.

## 2013-10-03 ENCOUNTER — Ambulatory Visit
Admission: RE | Admit: 2013-10-03 | Discharge: 2013-10-03 | Disposition: A | Discharge status: Home / Self Care | Payer: Self-pay | Source: Ambulatory Visit | Attending: Radiation Oncology | Admitting: Radiation Oncology

## 2013-10-06 ENCOUNTER — Ambulatory Visit: Payer: Self-pay

## 2013-10-07 ENCOUNTER — Ambulatory Visit
Admission: RE | Admit: 2013-10-07 | Discharge: 2013-10-07 | Disposition: A | Discharge status: Home / Self Care | Payer: Self-pay | Source: Ambulatory Visit | Attending: Radiation Oncology | Admitting: Radiation Oncology

## 2013-10-07 ENCOUNTER — Ambulatory Visit: Payer: Self-pay | Admitting: Radiation Oncology

## 2013-10-07 ENCOUNTER — Encounter: Payer: Self-pay | Admitting: Radiation Oncology

## 2013-10-07 VITALS — BP 124/76 | HR 91 | Temp 98.2°F | Ht 67.0 in | Wt 251.0 lb

## 2013-10-07 DIAGNOSIS — C541 Malignant neoplasm of endometrium: Secondary | ICD-10-CM

## 2013-10-07 NOTE — Progress Notes (Signed)
  Radiation Oncology         [phone] ________________________________  Name: Kelly [name] MRN: [id number]  Date: 10/07/2013  DOB: 1975-03-26  Weekly Radiation Therapy Management  Endometrial ca   Primary site: Corpus Uteri - Carcinoma   Staging method: AJCC 7th Edition   Clinical: Stage IIIA (T3a, N0, M0)   Pathologic: Stage IIIA (T3a, N0, M0)   Summary: Stage IIIA (T3a, N0, M0)   Current Dose: 9 Gy     Planned Dose:  45+ Gy  Narrative . . . . . . . . The patient presents for routine under treatment assessment.                                   The patient is without complaint. She had some nausea late last week but none since. Patient will be moving to the [place] area after she completes her radiation therapy.                                 Set-up films were reviewed.                                 The chart was checked. Physical Findings. . .  height is 5\' 7"  (1.702 m) and weight is 251 lb (113.853 kg). Her oral temperature is 98.2 F (36.8 C). Her blood pressure is 124/76 and her pulse is 91. . The lungs are clear. The heart has a regular rhythm and rate. The abdomen is soft and nontender with normal bowel sounds. Impression . . . . . . . The patient is tolerating radiation. Plan . . . . . . . . . . . . Continue treatment as planned.  ________________________________   Blair Promise, PhD, MD

## 2013-10-07 NOTE — Progress Notes (Signed)
Kelly [name] has completed 5 fractions to her pelvis.  She denies pain, diarrhea and skin irritation.  She reports an increase in urinary frequency and urgency.  She denies dysuria and vaginal/rectal bleeding.  She reports fatigue.  She also said she is going to be out of town on Friday and will not be able to come for treatment.

## 2013-10-08 ENCOUNTER — Ambulatory Visit: Payer: Self-pay

## 2013-10-09 ENCOUNTER — Ambulatory Visit
Admission: RE | Admit: 2013-10-09 | Discharge: 2013-10-09 | Disposition: A | Discharge status: Home / Self Care | Payer: Self-pay | Source: Ambulatory Visit | Attending: Radiation Oncology | Admitting: Radiation Oncology

## 2013-10-10 ENCOUNTER — Ambulatory Visit: Payer: Self-pay

## 2013-10-13 ENCOUNTER — Ambulatory Visit: Payer: Self-pay

## 2013-10-13 ENCOUNTER — Other Ambulatory Visit: Payer: Self-pay | Admitting: Oncology

## 2013-10-14 ENCOUNTER — Telehealth: Payer: Self-pay | Admitting: *Deleted

## 2013-10-14 ENCOUNTER — Ambulatory Visit: Payer: Self-pay

## 2013-10-14 ENCOUNTER — Ambulatory Visit: Payer: Self-pay | Admitting: Radiation Oncology

## 2013-10-14 ENCOUNTER — Telehealth: Payer: Self-pay | Admitting: Cardiovascular Disease

## 2013-10-14 ENCOUNTER — Telehealth: Payer: Self-pay | Admitting: Oncology

## 2013-10-14 MED ORDER — TICAGRELOR 90 MG PO TABS: 90.0000 mg | ORAL_TABLET | Freq: Two times a day (BID) | ORAL | Status: AC

## 2013-10-14 NOTE — Telephone Encounter (Signed)
Samples provided. At front desk. Patient notified.   Reports chest pressure and sporadic dizziness.   Was taking plavix (r/t cost of Brilinta) but has been out of this x2 days.

## 2013-10-14 NOTE — Telephone Encounter (Signed)
Called Kelly [name] due to her missing radiation again today.  Per Kelly [name], she has been out of town because she is moving and is on her way back.  She said she is going to come in for radiation tomorrow.

## 2013-10-14 NOTE — Telephone Encounter (Signed)
Patient left voicemail stating she is unable to keep flush and Dr. [name] appointments for today. Upon looking at patient's appointments it was noted she is not scheduled for these until tomorrow. Called patient back and clarified that her first appointment is tomorrow, not today, at 1:30pm for labs and flush and she will see Dr. [name] afterwards. Patient states she will be back in town and can keep appointments for tomorrow.

## 2013-10-14 NOTE — Telephone Encounter (Signed)
Pt would like some samples of Brilinta please. She did not know the mg.

## 2013-10-15 ENCOUNTER — Encounter: Payer: Self-pay | Admitting: Oncology

## 2013-10-15 ENCOUNTER — Ambulatory Visit (HOSPITAL_BASED_OUTPATIENT_CLINIC_OR_DEPARTMENT_OTHER): Payer: Self-pay | Admitting: Oncology

## 2013-10-15 ENCOUNTER — Ambulatory Visit
Admission: RE | Admit: 2013-10-15 | Discharge: 2013-10-15 | Disposition: A | Discharge status: Home / Self Care | Payer: Self-pay | Source: Ambulatory Visit | Attending: Radiation Oncology | Admitting: Radiation Oncology

## 2013-10-15 ENCOUNTER — Ambulatory Visit (HOSPITAL_BASED_OUTPATIENT_CLINIC_OR_DEPARTMENT_OTHER): Payer: Self-pay

## 2013-10-15 ENCOUNTER — Other Ambulatory Visit (HOSPITAL_BASED_OUTPATIENT_CLINIC_OR_DEPARTMENT_OTHER): Payer: No Typology Code available for payment source

## 2013-10-15 VITALS — BP 122/85 | HR 91 | Temp 98.1°F | Resp 18 | Ht 67.0 in | Wt 254.5 lb

## 2013-10-15 DIAGNOSIS — C549 Malignant neoplasm of corpus uteri, unspecified: Secondary | ICD-10-CM

## 2013-10-15 DIAGNOSIS — C541 Malignant neoplasm of endometrium: Secondary | ICD-10-CM

## 2013-10-15 DIAGNOSIS — E119 Type 2 diabetes mellitus without complications: Secondary | ICD-10-CM

## 2013-10-15 DIAGNOSIS — Z95828 Presence of other vascular implants and grafts: Secondary | ICD-10-CM

## 2013-10-15 DIAGNOSIS — D509 Iron deficiency anemia, unspecified: Secondary | ICD-10-CM

## 2013-10-15 LAB — COMPREHENSIVE METABOLIC PANEL (CC13)
ALT: 10 U/L (ref 0–55)
ANION GAP: 7 meq/L (ref 3–11)
AST: 9 U/L (ref 5–34)
Albumin: 3 g/dL — ABNORMAL LOW (ref 3.5–5.0)
Alkaline Phosphatase: 150 U/L (ref 40–150)
BUN: 4.9 mg/dL — AB (ref 7.0–26.0)
CALCIUM: 8.9 mg/dL (ref 8.4–10.4)
CO2: 28 meq/L (ref 22–29)
CREATININE: 0.7 mg/dL (ref 0.6–1.1)
Chloride: 105 mEq/L (ref 98–109)
Glucose: 264 mg/dl — ABNORMAL HIGH (ref 70–140)
Potassium: 3.7 mEq/L (ref 3.5–5.1)
Sodium: 140 mEq/L (ref 136–145)
Total Bilirubin: 0.99 mg/dL (ref 0.20–1.20)
Total Protein: 7.1 g/dL (ref 6.4–8.3)

## 2013-10-15 LAB — CBC WITH DIFFERENTIAL/PLATELET
BASO%: 0.2 % (ref 0.0–2.0)
Basophils Absolute: 0 10*3/uL (ref 0.0–0.1)
EOS%: 2.6 % (ref 0.0–7.0)
Eosinophils Absolute: 0.1 10*3/uL (ref 0.0–0.5)
HCT: 30.6 % — ABNORMAL LOW (ref 34.8–46.6)
HGB: 10.2 g/dL — ABNORMAL LOW (ref 11.6–15.9)
LYMPH%: 21.2 % (ref 14.0–49.7)
MCH: 22.8 pg — ABNORMAL LOW (ref 25.1–34.0)
MCHC: 33.3 g/dL (ref 31.5–36.0)
MCV: 68.5 fL — AB (ref 79.5–101.0)
MONO#: 0.3 10*3/uL (ref 0.1–0.9)
MONO%: 5.9 % (ref 0.0–14.0)
NEUT#: 3.6 10*3/uL (ref 1.5–6.5)
NEUT%: 70.1 % (ref 38.4–76.8)
PLATELETS: 161 10*3/uL (ref 145–400)
RBC: 4.47 10*6/uL (ref 3.70–5.45)
RDW: 20.5 % — ABNORMAL HIGH (ref 11.2–14.5)
WBC: 5.1 10*3/uL (ref 3.9–10.3)
lymph#: 1.1 10*3/uL (ref 0.9–3.3)
nRBC: 0 % (ref 0–0)

## 2013-10-15 MED ORDER — HEPARIN SOD (PORK) LOCK FLUSH 100 UNIT/ML IV SOLN
500.0000 [IU] | Freq: Once | INTRAVENOUS | Status: AC
Start: 2013-10-15 — End: 2013-10-15
  Administered 2013-10-15: 500 [IU] via INTRAVENOUS
  Filled 2013-10-15: qty 5

## 2013-10-15 MED ORDER — SODIUM CHLORIDE 0.9 % IJ SOLN
10.0000 mL | INTRAMUSCULAR | Status: DC | PRN
  Administered 2013-10-15: 10 mL via INTRAVENOUS
  Filled 2013-10-15: qty 10

## 2013-10-15 MED ORDER — LOPERAMIDE HCL 2 MG PO TABS: 2.0000 mg | ORAL_TABLET | Freq: Four times a day (QID) | ORAL | Status: AC | PRN

## 2013-10-15 MED ORDER — LOPERAMIDE HCL 2 MG PO TABS: ORAL_TABLET | ORAL | Status: DC

## 2013-10-15 NOTE — Progress Notes (Signed)
OFFICE PROGRESS NOTE   10/15/2013   Physicians:Thomas Patsi Sears [place]raway[place]Smith   INTERVAL HISTORY:   Patient is seen, alone for visit, in follow up of IIIA grade 2 endometrial carcinoma, for which she is post robotic assisted surgery by Dr [name] 04-30-13, one cycle of adjuvant taxol carboplatin on 06-11-13, one cycle of single agent carboplatin on 08-11-13, and now is receiving radiation therapy planned thru 11-10-13. Treatment has been complicated by acute MI on 06-17-13 and recurrent episodes of chest pain, as well as diabetes and difficult social situation. Planned repeat cardiac catheterization was cancelled, primarily because of infant care issues.  She has been on Plavix until Brilinta samples are available from cardiology.   The baby is now back with its biologic mother, the adoption apparently revoked.  Patient tells me that she plans to move to [place] in next 1-2 weeks, as she has family there. I have explained that it is very important that she complete present radiation in [place], as I cannot imagine transferring care to allow completion of radiation without significant delay. I will discuss referral to gyn onc with Drs [name] or Denman George; patient needs to get referral for cardiologist from Dr Claiborne Billings and will need radiation oncology follow up even if treatment course is completed in [place]. The new gyn oncologist can then refer to medical oncology if appropriate.  She has PAC,  flushed today. I have explained that this needs to be flushed every 6-8 weeks when not otherwise used.  She needs letter for disability, which I have done following visit today.   ONCOLOGIC HISTORY   Endometrial ca   05/01/2013 Initial Diagnosis Endometrial ca   05/16/2013 Surgery IIIA endometrial ca with + adnexal involement, negative nodes  Patient had long history of irregular, heavy vaginal bleeding and pelvic pain, evaluated at various locations since  2000. She was seen in [facility] ED 03-27-13 for increased pain and heavy vaginal bleeding, with CT AP showing large mass involving endometrium 12.5 x 8.0x5.2 cm and apparent left hydrosalpinx. PAP 04-03-13 had atypical squamous cells, negative high risk HPV; endometrial biopsy showed complex hyperplasia with atypia and florid papillary proliferation, ER, p53 and p16 positive. She saw Dr [name] on 04-30-13 and had robotic assisted total laparoscopic hysterectomy/BSO/bilateral pelvic and para aortic node evalustion at [facility] 05-16-2013. Pathology from [facility] [phone]) had endometrioid adenocarcinoma FIGO grade 2, invading 2 cm into myometrium where wall was 2.5 cm thick, no serosal involvement, lower uterine segment involvement present, cervical involvement present 27m depth, involvement of left fallopian tube and right ovary,lymphovascular space invasion present and extensive, all nodes negative (8 right pelvic, 6 left pelvic, 5 right periaortic, 2 left periaortic). Stains were negative for serous component. [facility] by presumed seizure, described as screaming and shaking by family, for which she was seen by neurology and medication changed to trileptal. Recommendation was 6 cycles of adjuvant carboplatin taxol chemotherapy given in split course with radiation. [facility], with necessary steroid premedication. She did not monitor blood sugars as instructed after that chemotherapy. She had acute STEMI on 06-17-13, with cath and RCA stent by Dr KClaiborne Billings She had PAC by IR while hospitalized again for chest pain and with bridging off Brilinta, then second cycle of chemotherapy with single agent carboplatin on 08-11-13. Because of continued frequent chest discomfort, she has not appeared stable to resume full chemotherapy; cardiology has considered repeat cath. She was seen  by Dr Denman George on 09-01-13, pelvic exam fortunately not showing recurrent disease, with  recommendation that she go to radiation now, then possibly additional chemotherapy after RT if more stable by then.   Review of systems as above, also: Chest pain still several times weekly. No bleeding. Some loose stools likely from RT. Appetite only fair, occasional nausea. No LE swelling. No fever or symptoms of infection. Sleeping poorly from stress. Blood sugars generally <150 Remainder of 10 point Review of Systems negative.  Objective:  Vital signs in last 24 hours:  BP 122/85  Pulse 91  Temp(Src) 98.1 F (36.7 C) (Oral)  Resp 18  Ht 5' 7"  (1.702 m)  Wt 254 lb 8 oz (115.44 kg)  BMI 39.85 kg/m2  LMP 02/24/2013 weight is up 3 lbs from late June  Alert, oriented and appropriate. Ambulatory without difficulty. Respirations not labored RA   HEENT:PERRL, sclerae not icteric. Oral mucosa moist without lesions, posterior pharynx clear.  Neck supple. No JVD.  Lymphatics:no cervical,suraclavicular or inguinal adenopathy Resp: clear to auscultation bilaterally and normal percussion bilaterally Cardio: regular rate and rhythm. No gallop. GI: abdomen obese, soft, nontender, not distended, no appreciable mass or organomegaly. Normally active bowel sounds. Surgical incisions not remarkable. Musculoskeletal/ Extremities: without pitting edema, cords, tenderness. Back not tender Neuro: no peripheral neuropathy. Otherwise nonfocal. PSYCH normal mood and affect Skin without rash, ecchymosis, petechiae Breast exam not repeated today. Portacath-without erythema or tenderness   Lab Results:  Results for orders placed in visit on 10/15/13  CBC WITH DIFFERENTIAL      Result Value Ref Range   WBC 5.1  3.9 - 10.3 10e3/uL   NEUT# 3.6  1.5 - 6.5 10e3/uL   HGB 10.2 (*) 11.6 - 15.9 g/dL   HCT 30.6 (*) 34.8 - 46.6 %   Platelets 161  145 - 400 10e3/uL   MCV 68.5 (*) 79.5 - 101.0 fL   MCH 22.8 (*) 25.1 - 34.0 pg   MCHC 33.3  31.5 - 36.0 g/dL   RBC 4.47  3.70 - 5.45 10e6/uL   RDW 20.5 (*) 11.2  - 14.5 %   lymph# 1.1  0.9 - 3.3 10e3/uL   MONO# 0.3  0.1 - 0.9 10e3/uL   Eosinophils Absolute 0.1  0.0 - 0.5 10e3/uL   Basophils Absolute 0.0  0.0 - 0.1 10e3/uL   NEUT% 70.1  38.4 - 76.8 %   LYMPH% 21.2  14.0 - 49.7 %   MONO% 5.9  0.0 - 14.0 %   EOS% 2.6  0.0 - 7.0 %   BASO% 0.2  0.0 - 2.0 %   nRBC 0  0 - 0 %  COMPREHENSIVE METABOLIC PANEL (TA56)      Result Value Ref Range   Sodium 140  136 - 145 mEq/L   Potassium 3.7  3.5 - 5.1 mEq/L   Chloride 105  98 - 109 mEq/L   CO2 28  22 - 29 mEq/L   Glucose 264 (*) 70 - 140 mg/dl   BUN 4.9 (*) 7.0 - 26.0 mg/dL   Creatinine 0.7  0.6 - 1.1 mg/dL   Total Bilirubin 0.99  0.20 - 1.20 mg/dL   Alkaline Phosphatase 150  40 - 150 U/L   AST 9  5 - 34 U/L   ALT 10  0 - 55 U/L   Total Protein 7.1  6.4 - 8.3 g/dL   Albumin 3.0 (*) 3.5 - 5.0 g/dL   Calcium 8.9  8.4 - 10.4 mg/dL  Anion Gap 7  3 - 11 mEq/L     Studies/Results:  No results found.  Medications: I have reviewed the patient's current medications. She understands that Brilinta samples are available from cardiologist today. Discussed imodium for RT diarrhea  DISCUSSION: emphasized need to complete RT already begun in [place]. She understands that she should have referrals to appropriate physicians already in place prior to moving to [place]. She will pick up disability letter + records, in addition to records being faxed to the new MDs.  Assessment/Plan: 1.IIIA endometrial carcinoma FIGO 2: post surgery at [facility] 05-16-13, one cycle taxol + carboplatin on 06-11-13, then treatment interrupted by acute MI 06-17-13, then one additional cycle carboplatin on 08-11-13. She was not clinically stable to continue chemotherapy at that point, so RT begun, planned thru 11-10-13. Possible additional chemotherapy after RT. Patient now plans to move to [place], so will assist with transfer of care there.  2.inferior STEMI 06-17-13 with RCA stent: intermittent chest discomfort, cardiology aware. Awaiting  Brilinta samples as she cannot obtain the drug otherwise. 3.diabetes: uncontrolled at presentation with gyn cancer, now blood sugars much improved.  4.difficult social situation: husband died suddenly ~ a month after adoption of baby Toi, just prior to the gyn cancer diagnosis.  5.PAC in, which needs flush in 6-8 weeks from 10-15-13 if not otherwise used.  6.hx seizure disorder, which fortunately has not been a problem recently, on Trileptal from Neurology at [facility] trait by Hgb electrophoresis at presentation  8.multifactorial anemia: is iron deficient. Did not try IV feraheme due to recent MI. Now on po ferrous fumarate.   Patient understood discussion and recommendations and is in agreement with plan. Time spent 25 min including >50% counseling and coordination of care.    Nikkie Liming P, MD   10/15/2013, 2:50 PM

## 2013-10-15 NOTE — Patient Instructions (Signed)

## 2013-10-16 ENCOUNTER — Ambulatory Visit
Admission: RE | Admit: 2013-10-16 | Discharge: 2013-10-16 | Disposition: A | Discharge status: Home / Self Care | Payer: Self-pay | Source: Ambulatory Visit | Attending: Radiation Oncology | Admitting: Radiation Oncology

## 2013-10-17 ENCOUNTER — Ambulatory Visit
Admission: RE | Admit: 2013-10-17 | Discharge: 2013-10-17 | Disposition: A | Discharge status: Home / Self Care | Payer: Self-pay | Source: Ambulatory Visit | Attending: Radiation Oncology | Admitting: Radiation Oncology

## 2013-10-17 NOTE — Telephone Encounter (Signed)
Brilinta 90 mg bid

## 2013-10-20 ENCOUNTER — Ambulatory Visit
Admission: RE | Admit: 2013-10-20 | Discharge: 2013-10-20 | Disposition: A | Discharge status: Home / Self Care | Payer: Self-pay | Source: Ambulatory Visit | Attending: Radiation Oncology | Admitting: Radiation Oncology

## 2013-10-20 ENCOUNTER — Encounter: Payer: Self-pay | Admitting: Radiation Oncology

## 2013-10-20 VITALS — BP 137/80 | HR 89 | Temp 98.1°F | Resp 12 | Wt 252.8 lb

## 2013-10-20 DIAGNOSIS — C541 Malignant neoplasm of endometrium: Secondary | ICD-10-CM

## 2013-10-20 NOTE — Progress Notes (Signed)
  Radiation Oncology         [phone] ________________________________  Name: Kelly [name] MRN: [id number]  Date: 10/20/2013  DOB: 1975/11/20  Weekly Radiation Therapy Management  Endometrial ca   Primary site: Corpus Uteri - Carcinoma   Staging method: AJCC 7th Edition   Clinical: Stage IIIA (T3a, N0, M0)   Pathologic: Stage IIIA (T3a, N0, M0)   Summary: Stage IIIA (T3a, N0, M0)   Current Dose: 18 Gy     Planned Dose:  45 +Gy  Narrative . . . . . . . . The patient presents for routine under treatment assessment. She had a rough weekend with a lot of nausea emesis and diarrhea. She also complains of rectal irritation. she reports taking Zofran which did not help a lot for nausea. She has not used any Ativan for this issue                                                                    Set-up films were reviewed.                                 The chart was checked. Physical Findings. . .  weight is 252 lb 12.8 oz (114.669 kg). Her oral temperature is 98.1 F (36.7 C). Her blood pressure is 137/80 and her pulse is 89. Her respiration is 12 and oxygen saturation is 98%. . The lungs are clear. The heart has a regular rhythm and rate. The abdomen is soft and nontender decreased bowel sounds. No rebound or guarding present. Impression . . . . . . . The patient is tolerating radiation. Plan . . . . . . . . . . . . Continue treatment as planned. I suspect a viral gastroenteritis causing her symptoms over the weekend. The patient will return tomorrow for further evaluation. She'll have examination of the perirectal area in light of her irritation this area. This evening the patient will try Ativan to see if this will help with her nausea issues. She may need  additional bloodwork tomorrow in addition.  ________________________________   Blair Promise, PhD, MD

## 2013-10-20 NOTE — Progress Notes (Signed)
She is currently in no pain. Pt complains of, Chills, Loss of Sleep, Fatigue, Generalized Weakness, Poor Appetite, Dizziness. Reports urinary frequency Pain with Urination.  Pt states they urinate 3 - 4 times per night.  Pt reports Nausea, Vomiting over this last weekend.  Reports she vomited approx 4 times this weekend. She reports she mostly only had liquids and tried the [employer] and toast made her feel nauseated. She reports she hasn't had anything to eat today. She reports she takes her antiemetics, but they aren't alleviating the symptoms.  She reports bouts of Diarrhea 3-5 a day for the last few weeks. Pt feels she has hemorrhoids and is using over the counter treatments for it.

## 2013-10-21 ENCOUNTER — Ambulatory Visit
Admission: RE | Admit: 2013-10-21 | Discharge: 2013-10-21 | Disposition: A | Discharge status: Home / Self Care | Payer: Self-pay | Source: Ambulatory Visit | Attending: Radiation Oncology | Admitting: Radiation Oncology

## 2013-10-21 ENCOUNTER — Ambulatory Visit: Admission: RE | Admit: 2013-10-21 | Payer: Self-pay | Source: Ambulatory Visit | Admitting: Radiation Oncology

## 2013-10-21 DIAGNOSIS — C549 Malignant neoplasm of corpus uteri, unspecified: Secondary | ICD-10-CM | POA: Insufficient documentation

## 2013-10-21 DIAGNOSIS — C541 Malignant neoplasm of endometrium: Secondary | ICD-10-CM

## 2013-10-21 LAB — CBC WITH DIFFERENTIAL/PLATELET
BASO%: 0.3 % (ref 0.0–2.0)
BASOS ABS: 0 10*3/uL (ref 0.0–0.1)
EOS ABS: 0.1 10*3/uL (ref 0.0–0.5)
EOS%: 2.1 % (ref 0.0–7.0)
HCT: 34.3 % — ABNORMAL LOW (ref 34.8–46.6)
HEMOGLOBIN: 10.9 g/dL — AB (ref 11.6–15.9)
LYMPH#: 0.6 10*3/uL — AB (ref 0.9–3.3)
LYMPH%: 9.8 % — ABNORMAL LOW (ref 14.0–49.7)
MCH: 22.4 pg — ABNORMAL LOW (ref 25.1–34.0)
MCHC: 31.7 g/dL (ref 31.5–36.0)
MCV: 70.8 fL — ABNORMAL LOW (ref 79.5–101.0)
MONO#: 0.4 10*3/uL (ref 0.1–0.9)
MONO%: 6.7 % (ref 0.0–14.0)
NEUT%: 81.1 % — ABNORMAL HIGH (ref 38.4–76.8)
NEUTROS ABS: 4.8 10*3/uL (ref 1.5–6.5)
Platelets: 228 10*3/uL (ref 145–400)
RBC: 4.85 10*6/uL (ref 3.70–5.45)
RDW: 21.1 % — AB (ref 11.2–14.5)
WBC: 6 10*3/uL (ref 3.9–10.3)

## 2013-10-21 LAB — BASIC METABOLIC PANEL (CC13)
Anion Gap: 9 mEq/L (ref 3–11)
BUN: 6.3 mg/dL — AB (ref 7.0–26.0)
CHLORIDE: 106 meq/L (ref 98–109)
CO2: 23 mEq/L (ref 22–29)
Calcium: 8.8 mg/dL (ref 8.4–10.4)
Creatinine: 0.8 mg/dL (ref 0.6–1.1)
GLUCOSE: 381 mg/dL — AB (ref 70–140)
POTASSIUM: 3.8 meq/L (ref 3.5–5.1)
Sodium: 138 mEq/L (ref 136–145)

## 2013-10-22 ENCOUNTER — Encounter (HOSPITAL_COMMUNITY): Payer: Self-pay | Admitting: Emergency Medicine

## 2013-10-22 ENCOUNTER — Emergency Department (HOSPITAL_COMMUNITY)
Admission: EM | Admit: 2013-10-22 | Discharge: 2013-10-22 | Disposition: A | Discharge status: Home / Self Care | Payer: Self-pay | Attending: Emergency Medicine | Admitting: Emergency Medicine

## 2013-10-22 ENCOUNTER — Ambulatory Visit: Payer: Self-pay

## 2013-10-22 ENCOUNTER — Ambulatory Visit: Payer: No Typology Code available for payment source | Admitting: Oncology

## 2013-10-22 ENCOUNTER — Telehealth: Payer: Self-pay | Admitting: Oncology

## 2013-10-22 ENCOUNTER — Emergency Department (HOSPITAL_COMMUNITY): Payer: Self-pay

## 2013-10-22 ENCOUNTER — Other Ambulatory Visit: Payer: No Typology Code available for payment source

## 2013-10-22 DIAGNOSIS — Z9889 Other specified postprocedural states: Secondary | ICD-10-CM | POA: Insufficient documentation

## 2013-10-22 DIAGNOSIS — G40909 Epilepsy, unspecified, not intractable, without status epilepticus: Secondary | ICD-10-CM | POA: Insufficient documentation

## 2013-10-22 DIAGNOSIS — R071 Chest pain on breathing: Secondary | ICD-10-CM | POA: Insufficient documentation

## 2013-10-22 DIAGNOSIS — Z7902 Long term (current) use of antithrombotics/antiplatelets: Secondary | ICD-10-CM | POA: Insufficient documentation

## 2013-10-22 DIAGNOSIS — F411 Generalized anxiety disorder: Secondary | ICD-10-CM | POA: Insufficient documentation

## 2013-10-22 DIAGNOSIS — IMO0001 Reserved for inherently not codable concepts without codable children: Secondary | ICD-10-CM | POA: Insufficient documentation

## 2013-10-22 DIAGNOSIS — R079 Chest pain, unspecified: Secondary | ICD-10-CM | POA: Insufficient documentation

## 2013-10-22 DIAGNOSIS — R0789 Other chest pain: Secondary | ICD-10-CM

## 2013-10-22 DIAGNOSIS — I10 Essential (primary) hypertension: Secondary | ICD-10-CM | POA: Insufficient documentation

## 2013-10-22 DIAGNOSIS — Z794 Long term (current) use of insulin: Secondary | ICD-10-CM | POA: Insufficient documentation

## 2013-10-22 DIAGNOSIS — Z7982 Long term (current) use of aspirin: Secondary | ICD-10-CM | POA: Insufficient documentation

## 2013-10-22 DIAGNOSIS — Z8542 Personal history of malignant neoplasm of other parts of uterus: Secondary | ICD-10-CM | POA: Insufficient documentation

## 2013-10-22 DIAGNOSIS — R202 Paresthesia of skin: Secondary | ICD-10-CM

## 2013-10-22 DIAGNOSIS — E669 Obesity, unspecified: Secondary | ICD-10-CM | POA: Insufficient documentation

## 2013-10-22 DIAGNOSIS — K219 Gastro-esophageal reflux disease without esophagitis: Secondary | ICD-10-CM | POA: Insufficient documentation

## 2013-10-22 DIAGNOSIS — E1165 Type 2 diabetes mellitus with hyperglycemia: Secondary | ICD-10-CM

## 2013-10-22 DIAGNOSIS — Z91040 Latex allergy status: Secondary | ICD-10-CM | POA: Insufficient documentation

## 2013-10-22 DIAGNOSIS — Z862 Personal history of diseases of the blood and blood-forming organs and certain disorders involving the immune mechanism: Secondary | ICD-10-CM | POA: Insufficient documentation

## 2013-10-22 DIAGNOSIS — E785 Hyperlipidemia, unspecified: Secondary | ICD-10-CM | POA: Insufficient documentation

## 2013-10-22 DIAGNOSIS — I252 Old myocardial infarction: Secondary | ICD-10-CM | POA: Insufficient documentation

## 2013-10-22 DIAGNOSIS — Z880 Allergy status to penicillin: Secondary | ICD-10-CM | POA: Insufficient documentation

## 2013-10-22 DIAGNOSIS — Z79899 Other long term (current) drug therapy: Secondary | ICD-10-CM | POA: Insufficient documentation

## 2013-10-22 DIAGNOSIS — R209 Unspecified disturbances of skin sensation: Secondary | ICD-10-CM | POA: Insufficient documentation

## 2013-10-22 LAB — COMPREHENSIVE METABOLIC PANEL
ALK PHOS: 156 U/L — AB (ref 39–117)
ALT: 20 U/L (ref 0–35)
AST: 11 U/L (ref 0–37)
Albumin: 3.3 g/dL — ABNORMAL LOW (ref 3.5–5.2)
Anion gap: 12 (ref 5–15)
BILIRUBIN TOTAL: 0.5 mg/dL (ref 0.3–1.2)
BUN: 9 mg/dL (ref 6–23)
CHLORIDE: 98 meq/L (ref 96–112)
CO2: 26 meq/L (ref 19–32)
Calcium: 8.7 mg/dL (ref 8.4–10.5)
Creatinine, Ser: 0.48 mg/dL — ABNORMAL LOW (ref 0.50–1.10)
GFR calc Af Amer: >90 mL/min (ref >90)
GFR calc non Af Amer: >90 mL/min (ref >90)
Glucose, Bld: 279 mg/dL — ABNORMAL HIGH (ref 70–99)
Potassium: 3.9 mEq/L (ref 3.7–5.3)
SODIUM: 136 meq/L — AB (ref 137–147)
Total Protein: 7.5 g/dL (ref 6.0–8.3)

## 2013-10-22 LAB — CBC WITH DIFFERENTIAL/PLATELET
BASOS PCT: 0 % (ref 0–1)
Basophils Absolute: 0 10*3/uL (ref 0.0–0.1)
EOS PCT: 2 % (ref 0–5)
Eosinophils Absolute: 0.1 10*3/uL (ref 0.0–0.7)
HCT: 31.7 % — ABNORMAL LOW (ref 36.0–46.0)
Hemoglobin: 10.5 g/dL — ABNORMAL LOW (ref 12.0–15.0)
LYMPHS ABS: 1 10*3/uL (ref 0.7–4.0)
Lymphocytes Relative: 16 % (ref 12–46)
MCH: 23.1 pg — ABNORMAL LOW (ref 26.0–34.0)
MCHC: 33.1 g/dL (ref 30.0–36.0)
MCV: 69.8 fL — AB (ref 78.0–100.0)
Monocytes Absolute: 0.3 10*3/uL (ref 0.1–1.0)
Monocytes Relative: 5 % (ref 3–12)
NEUTROS PCT: 77 % (ref 43–77)
Neutro Abs: 4.7 10*3/uL (ref 1.7–7.7)
Platelets: 206 10*3/uL (ref 150–400)
RBC: 4.54 MIL/uL (ref 3.87–5.11)
RDW: 19.6 % — ABNORMAL HIGH (ref 11.5–15.5)
WBC: 6.1 10*3/uL (ref 4.0–10.5)

## 2013-10-22 LAB — PROTIME-INR
INR: 0.96 (ref 0.00–1.49)
Prothrombin Time: 12.8 seconds (ref 11.6–15.2)

## 2013-10-22 LAB — TROPONIN I

## 2013-10-22 LAB — D-DIMER, QUANTITATIVE: D-Dimer, Quant: 0.83 ug/mL-FEU — ABNORMAL HIGH (ref 0.00–0.48)

## 2013-10-22 LAB — LIPASE, BLOOD: Lipase: 22 U/L (ref 11–59)

## 2013-10-22 MED ORDER — KETOROLAC TROMETHAMINE 30 MG/ML IJ SOLN
INTRAMUSCULAR | Status: AC
  Filled 2013-10-22: qty 2

## 2013-10-22 MED ORDER — IOHEXOL 350 MG/ML SOLN
100.0000 mL | Freq: Once | INTRAVENOUS | Status: AC | PRN
  Administered 2013-10-22: 100 mL via INTRAVENOUS

## 2013-10-22 MED ORDER — METHOCARBAMOL 500 MG PO TABS: 1000.0000 mg | ORAL_TABLET | Freq: Three times a day (TID) | ORAL | Status: AC | PRN

## 2013-10-22 NOTE — ED Notes (Signed)
IV removed that was started during downtime. Pt A&OX4, ambulatory at d/c with steady gait, NAD.

## 2013-10-22 NOTE — Telephone Encounter (Signed)
Called Kelly [name] and left her a message to call back regarding her lab results from yesterday.

## 2013-10-22 NOTE — Discharge Instructions (Signed)
Chest Wall Pain °Chest wall pain is pain felt in or around the chest bones and muscles. It may take up to 6 weeks to get better. It may take longer if you are active. Chest wall pain can happen on its own. Other times, things like germs, injury, coughing, or exercise can cause the pain. °HOME CARE  °· Avoid activities that make you tired or cause pain. Try not to use your chest, belly (abdominal), or side muscles. Do not use heavy weights. °· Put ice on the sore area. °¨ Put ice in a plastic bag. °¨ Place a towel between your skin and the bag. °¨ Leave the ice on for 15-20 minutes for the first 2 days. °· Only take medicine as told by your doctor. °GET HELP RIGHT AWAY IF:  °· You have more pain or are very uncomfortable. °· You have a fever. °· Your chest pain gets worse. °· You have new problems. °· You feel sick to your stomach (nauseous) or throw up (vomit). °· You start to sweat or feel lightheaded. °· You have a cough with mucus (phlegm). °· You cough up blood. °MAKE SURE YOU:  °· Understand these instructions. °· Will watch your condition. °· Will get help right away if you are not doing well or get worse. °Document Released: 08/16/2007 Document Revised: 05/22/2011 Document Reviewed: 10/24/2010 °ExitCare® Patient Information ©2015 [employer]. This information is not intended to replace advice given to you by your health care provider. Make sure you discuss any questions you have with your health care provider. ° °

## 2013-10-22 NOTE — ED Provider Notes (Signed)
CSN: [id number]     Arrival date & time 10/22/13  0023 History   First MD Initiated Contact with Patient 10/22/13 0436     No chief complaint on file.    (Consider location/radiation/quality/duration/timing/severity/associated sxs/prior Treatment) HPI Patient states she was lying on her right side. When she sat up she noticed that she had paresthesias in the right arm. 30 minutes after she's noticed a pain on the right side of the chest as well. The pain in the chest is worse with movement and palpation. It is not associated with shortness of breath. Patient has no weakness in the right upper extremity. She continues to have paresthesias in a glovelike pattern. No previously similar episodes. She denies any recent travel or surgeries. She denies any new lower extremity swelling or pain. Patient does have a history of previous MI with stent placement. She also has a history of endometrial cancer for which she is receiving radiation. Past Medical History  Diagnosis Date  . Obesity   . GERD (gastroesophageal reflux disease)     occasional no meds   . Endometrial cancer 05/2013    IIIA FIGO2.  Dr. Nancy Marus, Dr. Gery Pray, Dr. Lavonia Drafts  . Type II or unspecified type diabetes mellitus without mention of complication, uncontrolled   . STEMI (ST elevation myocardial infarction) 06/17/13    cardiac cath with extensive thrombus and proximal third RCA 90% stenosis, drug eluting stent placed; [facility];  Dr. Shelva Majestic  . Hypertension   . Anemia     multifactoral  . Seizures     age 59yo; tonic clonic, [facility]  . Sickle cell trait   . Hyperlipidemia   . Anxiety    Past Surgical History  Procedure Laterality Date  . Abdominal hysterectomy  05/16/13    [facility], Dr. Nancy Marus  . Cardiac catheterization  06/17/13    extensive thrombus with proximal third RCA 90% stenosis, stent placed, Dr. Shelva Majestic   Family History  Problem Relation Age of Onset  . Depression Mother    . Hypertension Father   . Hypertension Sister   . Cancer Paternal Aunt     breast  . Heart disease Neg Hx   . Diabetes Sister     type 1 diabetes   History  Substance Use Topics  . Smoking status: Never Smoker   . Smokeless tobacco: Never Used  . Alcohol Use: No   OB History   Grav Para Term Preterm Abortions TAB SAB Ect Mult Living   0 0 0 0 0 0 0 0 0 0      Review of Systems  Constitutional: Negative for fever and chills.  Respiratory: Negative for cough, shortness of breath and wheezing.   Cardiovascular: Positive for chest pain. Negative for palpitations and leg swelling.  Gastrointestinal: Negative for nausea, vomiting, abdominal pain and diarrhea.  Musculoskeletal: Negative for back pain, neck pain and neck stiffness.  Skin: Negative for rash and wound.  Neurological: Positive for numbness. Negative for dizziness, weakness and headaches.  All other systems reviewed and are negative.     Allergies  Amoxicillin; Flagyl; Latex; and Adhesive  Home Medications   Prior to Admission medications   Medication Sig Start Date End Date Taking? Authorizing Provider  aspirin EC 81 MG EC tablet Take 1 tablet (81 mg total) by mouth daily. 06/20/13   Kelvin Cellar, MD  atorvastatin (LIPITOR) 80 MG tablet Take 1 tablet (80 mg total) by mouth daily at 6 PM. 08/06/13  Lennis Marion Downer, MD  carvedilol (COREG) 12.5 MG tablet Take 1 tablet (12.5 mg total) by mouth 2 (two) times daily. 07/04/13   Cecilie Kicks, NP  clopidogrel (PLAVIX) 75 MG tablet Take 1 tablet (75 mg total) by mouth daily. 09/22/13   Erlene Quan, PA-C  docusate sodium (STOOL SOFTENER) 100 MG capsule Take 300 mg by mouth daily.  05/18/13   Historical Provider, MD  emollient (BIAFINE) cream Apply topically as needed.    Historical Provider, MD  ferrous fumarate (HEMOCYTE - 106 MG FE) 325 (106 FE) MG TABS tablet Take 1 tab daily on an empty stomach with OJ. 08/20/13   Lennis P Livesay, MD  glipiZIDE (GLUCOTROL) 5 MG tablet  Take 1 tablet (5 mg total) by mouth 2 (two) times daily before a meal. 07/02/13   Camelia Eng Tysinger, PA-C  insulin aspart (NOVOLOG) 100 UNIT/ML injection As directed 07/10/13   Gordy Levan, MD  Insulin Syringes, Disposable, U-100 1 ML MISC Use as directed with insulin 07/18/13   Lennis Marion Downer, MD  isosorbide mononitrate (IMDUR) 30 MG 24 hr tablet Take 1 tablet (30 mg total) by mouth daily. 09/05/13   Brittainy Simmons, PA-C  lidocaine-prilocaine (EMLA) cream Apply 1-2 hours prior to Portacath access as directed. 08/11/13   Lennis Marion Downer, MD  loperamide (IMODIUM A-D) 2 MG tablet Take 1 tablet (2 mg total) by mouth 4 (four) times daily as needed for diarrhea or loose stools. 10/15/13   Lennis Marion Downer, MD  loperamide (IMODIUM A-D) 2 MG tablet Take 2 tabs after the first loose stool then 1 tablet after each loose stool not to excede 8 tabs in 24 hours. 10/15/13   Lennis Marion Downer, MD  LORazepam (ATIVAN) 1 MG tablet Place 1/2- 1 tab under tongue or swallow every 6 hr as needed for nausea.  Take 1 tab the night of chemo.  Will make drowsy 06/04/13   Gordy Levan, MD  metFORMIN (GLUCOPHAGE) 1000 MG tablet TAKE ONE TABLET BY MOUTH TWICE DAILY WITH MEALS 07/31/13   Camelia Eng Tysinger, PA-C  nitroGLYCERIN (NITROSTAT) 0.4 MG SL tablet Place 1 tablet (0.4 mg total) under the tongue every 5 (five) minutes as needed for chest pain. 06/20/13   Kelvin Cellar, MD  ondansetron (ZOFRAN) 8 MG tablet Take 1 tablet (8 mg total) by mouth every 8 (eight) hours as needed for nausea or vomiting (Will not make you drowsy). 07/22/13   Lennis Marion Downer, MD  OXcarbazepine (TRILEPTAL) 150 MG tablet Take 150 mg by mouth daily as needed (epilepsy).  05/18/13 05/18/14  Historical Provider, MD  oxyCODONE-acetaminophen (PERCOCET/ROXICET) 5-325 MG per tablet Take by mouth every 4 (four) hours as needed for severe pain.    Historical Provider, MD  pantoprazole (PROTONIX) 40 MG tablet Take 1 tablet (40 mg total) by mouth daily. 06/04/13   Lennis  Marion Downer, MD  prochlorperazine (COMPAZINE) 10 MG tablet Take 1 tablet (10 mg total) by mouth every 6 (six) hours as needed for nausea or vomiting. 07/22/13   Lennis Marion Downer, MD  ticagrelor (BRILINTA) 90 MG TABS tablet Take 1 tablet (90 mg total) by mouth 2 (two) times daily. 10/14/13   Troy Sine, MD  venlafaxine XR (EFFEXOR-XR) 37.5 MG 24 hr capsule Take 1 capsule (37.5 mg total) by mouth daily with breakfast. 09/01/13   Everitt Amber, MD  vitamin C (ASCORBIC ACID) 500 MG tablet Take 1 tab daily with iron on an empty stomach. 07/14/13   Lennis P  [name], MD   LMP 02/24/2013 Physical Exam  Nursing note and vitals reviewed. Constitutional: She is oriented to person, place, and time. She appears well-developed and well-nourished. No distress.  HENT:  Head: Normocephalic and atraumatic.  Mouth/Throat: Oropharynx is clear and moist. No oropharyngeal exudate.  Eyes: EOM are normal. Pupils are equal, round, and reactive to light.  Neck: Normal range of motion. Neck supple.  No meningismus. No posterior cervical spine tenderness to palpation.  Cardiovascular: Normal rate and regular rhythm.  Exam reveals no gallop and no friction rub.   No murmur heard. Pulmonary/Chest: Effort normal and breath sounds normal. No respiratory distress. She has no wheezes. She has no rales. She exhibits tenderness (chest pain is completely reproduced with palpation of the right parasternal border and right packed rales muscles. There is no crepitance or deformity.).  Abdominal: Soft. Bowel sounds are normal. She exhibits no distension and no mass. There is no tenderness. There is no rebound and no guarding.  Musculoskeletal: Normal range of motion. She exhibits no edema and no tenderness.  No calf swelling or tenderness.  Neurological: She is alert and oriented to person, place, and time.  5/5 motor in all extremities. Subjective decrease sensation to the right upper extremity. Distal pulses intact  Skin: Skin is warm  and dry. No rash noted. No erythema.  Psychiatric: She has a normal mood and affect. Her behavior is normal.    ED Course  Procedures (including critical care time) Labs Review Labs Reviewed  CBC WITH DIFFERENTIAL - Abnormal; Notable for the following:    Hemoglobin 10.5 (*)    HCT 31.7 (*)    MCV 69.8 (*)    MCH 23.1 (*)    RDW 19.6 (*)    All other components within normal limits  COMPREHENSIVE METABOLIC PANEL - Abnormal; Notable for the following:    Sodium 136 (*)    Glucose, Bld 279 (*)    Creatinine, Ser 0.48 (*)    Albumin 3.3 (*)    Alkaline Phosphatase 156 (*)    All other components within normal limits  D-DIMER, QUANTITATIVE - Abnormal; Notable for the following:    D-Dimer, Quant 0.83 (*)    All other components within normal limits  LIPASE, BLOOD  PROTIME-INR  TROPONIN I    Imaging Review No results found.   EKG Interpretation   Date/Time:  Wednesday October 22 2013 00:27:39 EDT Ventricular Rate:  89 PR Interval:  154 QRS Duration: 76 QT Interval:  370 QTC Calculation: 450 R Axis:   10 Text Interpretation:  Normal sinus rhythm Normal ECG Confirmed by  Turon Kilmer  MD, Gable Odonohue ([id number]) on 10/22/2013 5:32:55 AM      MDM   Final diagnoses:  None   EKG is normal. Troponin is normal. D-dimer is slightly elevated. Given her history of active cancer CT imaging was obtained. No evidence of PE. Chest pain is completely reproduced with palpation. We'll treat for musculoskeletal cause of patient's chest pain. She's been given return precautions and has voiced understanding.     Julianne Rice, MD 10/22/13 [phone]

## 2013-10-23 ENCOUNTER — Telehealth: Payer: Self-pay | Admitting: *Deleted

## 2013-10-23 ENCOUNTER — Telehealth: Payer: Self-pay | Admitting: Oncology

## 2013-10-23 ENCOUNTER — Ambulatory Visit
Admission: RE | Admit: 2013-10-23 | Discharge: 2013-10-23 | Disposition: A | Discharge status: Home / Self Care | Payer: Self-pay | Source: Ambulatory Visit | Attending: Radiation Oncology | Admitting: Radiation Oncology

## 2013-10-23 ENCOUNTER — Encounter: Payer: Self-pay | Admitting: Radiation Oncology

## 2013-10-23 VITALS — BP 114/79 | HR 79 | Temp 98.7°F

## 2013-10-23 DIAGNOSIS — C541 Malignant neoplasm of endometrium: Secondary | ICD-10-CM

## 2013-10-23 NOTE — Progress Notes (Signed)
Kelly [name] noticed a rash on Tuesday on her arms.  She said it has now spread to her lower legs.  She reports that it is itchy.  Red welts noted on her right forearm, left shoulder and both lower legs.  She denies trouble breathing.

## 2013-10-23 NOTE — Progress Notes (Signed)
  Radiation Oncology         [phone] ________________________________  Name: Kelly [name] MRN: [id number]  Date: 10/23/2013  DOB: Jul 26, 1975  Weekly Radiation Therapy Management  Current Dose: 21.6 Gy     Planned Dose:  45 Gy  Narrative . . . . . . . . The patient presents for routine under treatment assessment.                                   The patient requested to be seen today for development of welts along her arms and some in her leg areas. She denies any breathing problems or swallowing difficulties. She has not changed her medications her other issues. Patient was seen in the emergency room yesterday with chest pain and and ruled out for any serious issue.                                 Set-up films were reviewed.                                 The chart was checked. Physical Findings. . .  oral temperature is 98.7 F (37.1 C). Her blood pressure is 114/79 and her pulse is 79. Her oxygen saturation is 100%. . Macular papular rash throughout the forearms and some in the lower leg areas. Impression . . . . . . . The patient is tolerating radiation.  Plan . . . . . . . . . . . . Continue treatment as planned. The patient is advised to take oral Benadryl to see if this will address the issue. She will let us know if her symptoms worsen. She knows to present to the emergency room if she develops swallowing difficulties or breathing problems.  ________________________________   Blair Promise, PhD, MD

## 2013-10-23 NOTE — Telephone Encounter (Signed)
Kelly [name] called back and said that she is feeling better today.  She went to the ER yesterday for right chest pain.  Reviewed her lab work with her from 10/21/13.  Advised her that her glucose was hight at 381.  Kelly [name] said she drank a soda right before the labs were drawn.  She is taking metformin and glipizide.  She said that she will be coming for radiation today.

## 2013-10-23 NOTE — Telephone Encounter (Signed)
Called and left a message for a return call to review lab results from 10/21/13.

## 2013-10-23 NOTE — Telephone Encounter (Signed)
Spoke with Kelly [name] and she states she is planning on moving to [place] on 11/13/13. States the soonest she could see new MD there would be 11/18/13. This information forwarded to Kelly [name] and Kelly [name] as noted below by Dr. [name].

## 2013-10-23 NOTE — Telephone Encounter (Signed)
Spoke with Kelly [name] and she states she has a rash on her arms, shoulders, and hands since this past Tuesday. Patient denies using any new soaps, creams, or laundry detergent. Describes rash as "itchy, red raised bumps." Spoke with Dr. [name] and she recommended since patient has radiation today that maybe one of the nurses there could assess. Called and spoke with Sam, RN in rad onc and passed along this information and she states someone will be sure to look at it.

## 2013-10-23 NOTE — Telephone Encounter (Signed)
Message copied by Christa See on Thu Oct 23, 2013  2:57 PM ------      Message from: Gordy Levan      Created: Thu Oct 23, 2013 12:58 PM       Please let patient know that Dr [name] suggests that she establish with      Dr Bryn Gulling at [facility]            Dr [name] is glad to set up the appointment. Please find out when patient expects to move. (As I explained to Vivien Rota, she really needs to stay in [place] to complete RT, so hopefully move is after RT finishes).      If she agrees, please have Joylene John or Armanda Magic coordinate the referral with Dr [name]. Note Barbaraann Share already has med onc records copied for the referral, tho not the other records.            Thanks      Cc KF, LA ------

## 2013-10-24 ENCOUNTER — Ambulatory Visit
Admission: RE | Admit: 2013-10-24 | Discharge: 2013-10-24 | Disposition: A | Discharge status: Home / Self Care | Payer: Self-pay | Source: Ambulatory Visit | Attending: Radiation Oncology | Admitting: Radiation Oncology

## 2013-10-27 ENCOUNTER — Ambulatory Visit: Payer: Self-pay

## 2013-10-28 ENCOUNTER — Telehealth: Payer: Self-pay | Admitting: Oncology

## 2013-10-28 ENCOUNTER — Ambulatory Visit: Payer: Self-pay

## 2013-10-28 ENCOUNTER — Ambulatory Visit: Payer: Self-pay | Admitting: Radiation Oncology
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
## 2013-10-29 ENCOUNTER — Ambulatory Visit: Payer: Self-pay

## 2013-10-30 ENCOUNTER — Ambulatory Visit
Admission: RE | Admit: 2013-10-30 | Discharge: 2013-10-30 | Disposition: A | Discharge status: Home / Self Care | Payer: Self-pay | Source: Ambulatory Visit | Attending: Radiation Oncology | Admitting: Radiation Oncology

## 2013-10-31 ENCOUNTER — Ambulatory Visit: Payer: Self-pay

## 2013-10-31 ENCOUNTER — Ambulatory Visit
Admission: RE | Admit: 2013-10-31 | Discharge: 2013-10-31 | Disposition: A | Discharge status: Home / Self Care | Payer: Self-pay | Source: Ambulatory Visit | Attending: Radiation Oncology | Admitting: Radiation Oncology

## 2013-11-03 ENCOUNTER — Emergency Department (HOSPITAL_COMMUNITY)
Admission: EM | Admit: 2013-11-03 | Discharge: 2013-11-03 | Disposition: A | Discharge status: Home / Self Care | Payer: Self-pay | Attending: Emergency Medicine | Admitting: Emergency Medicine

## 2013-11-03 ENCOUNTER — Ambulatory Visit
Admission: RE | Admit: 2013-11-03 | Discharge: 2013-11-03 | Disposition: A | Discharge status: Home / Self Care | Payer: Self-pay | Source: Ambulatory Visit | Attending: Radiation Oncology | Admitting: Radiation Oncology

## 2013-11-03 ENCOUNTER — Encounter (HOSPITAL_COMMUNITY): Payer: Self-pay | Admitting: Emergency Medicine

## 2013-11-03 ENCOUNTER — Telehealth: Payer: Self-pay | Admitting: Oncology

## 2013-11-03 DIAGNOSIS — Z880 Allergy status to penicillin: Secondary | ICD-10-CM | POA: Insufficient documentation

## 2013-11-03 DIAGNOSIS — G40909 Epilepsy, unspecified, not intractable, without status epilepticus: Secondary | ICD-10-CM | POA: Insufficient documentation

## 2013-11-03 DIAGNOSIS — Z8544 Personal history of malignant neoplasm of other female genital organs: Secondary | ICD-10-CM | POA: Insufficient documentation

## 2013-11-03 DIAGNOSIS — K219 Gastro-esophageal reflux disease without esophagitis: Secondary | ICD-10-CM | POA: Insufficient documentation

## 2013-11-03 DIAGNOSIS — E669 Obesity, unspecified: Secondary | ICD-10-CM | POA: Insufficient documentation

## 2013-11-03 DIAGNOSIS — R079 Chest pain, unspecified: Secondary | ICD-10-CM

## 2013-11-03 DIAGNOSIS — E119 Type 2 diabetes mellitus without complications: Secondary | ICD-10-CM | POA: Insufficient documentation

## 2013-11-03 DIAGNOSIS — I10 Essential (primary) hypertension: Secondary | ICD-10-CM | POA: Insufficient documentation

## 2013-11-03 DIAGNOSIS — F411 Generalized anxiety disorder: Secondary | ICD-10-CM | POA: Insufficient documentation

## 2013-11-03 DIAGNOSIS — Z91040 Latex allergy status: Secondary | ICD-10-CM | POA: Insufficient documentation

## 2013-11-03 DIAGNOSIS — S20219A Contusion of unspecified front wall of thorax, initial encounter: Secondary | ICD-10-CM | POA: Insufficient documentation

## 2013-11-03 DIAGNOSIS — S298XXA Other specified injuries of thorax, initial encounter: Secondary | ICD-10-CM | POA: Insufficient documentation

## 2013-11-03 DIAGNOSIS — Z79899 Other long term (current) drug therapy: Secondary | ICD-10-CM | POA: Insufficient documentation

## 2013-11-03 DIAGNOSIS — Z7982 Long term (current) use of aspirin: Secondary | ICD-10-CM | POA: Insufficient documentation

## 2013-11-03 DIAGNOSIS — Z794 Long term (current) use of insulin: Secondary | ICD-10-CM | POA: Insufficient documentation

## 2013-11-03 DIAGNOSIS — E785 Hyperlipidemia, unspecified: Secondary | ICD-10-CM | POA: Insufficient documentation

## 2013-11-03 DIAGNOSIS — I252 Old myocardial infarction: Secondary | ICD-10-CM | POA: Insufficient documentation

## 2013-11-03 DIAGNOSIS — Z862 Personal history of diseases of the blood and blood-forming organs and certain disorders involving the immune mechanism: Secondary | ICD-10-CM | POA: Insufficient documentation

## 2013-11-03 DIAGNOSIS — Z7902 Long term (current) use of antithrombotics/antiplatelets: Secondary | ICD-10-CM | POA: Insufficient documentation

## 2013-11-03 LAB — I-STAT CHEM 8, ED
BUN: 7 mg/dL (ref 6–23)
CHLORIDE: 103 meq/L (ref 96–112)
CREATININE: 0.5 mg/dL (ref 0.50–1.10)
Calcium, Ion: 1.05 mmol/L — ABNORMAL LOW (ref 1.12–1.23)
Glucose, Bld: 445 mg/dL — ABNORMAL HIGH (ref 70–99)
HCT: 39 % (ref 36.0–46.0)
Hemoglobin: 13.3 g/dL (ref 12.0–15.0)
POTASSIUM: 3.8 meq/L (ref 3.7–5.3)
SODIUM: 136 meq/L — AB (ref 137–147)
TCO2: 20 mmol/L (ref 0–100)

## 2013-11-03 LAB — I-STAT TROPONIN, ED: TROPONIN I, POC: 0 ng/mL (ref 0.00–0.08)

## 2013-11-03 MED ORDER — SODIUM CHLORIDE 0.9 % IV SOLN
INTRAVENOUS | Status: DC
  Administered 2013-11-03: 21:00:00 via INTRAVENOUS

## 2013-11-03 MED ORDER — LORAZEPAM 2 MG/ML IJ SOLN
1.0000 mg | INTRAMUSCULAR | Status: DC | PRN
  Filled 2013-11-03: qty 1

## 2013-11-03 MED ORDER — FENTANYL CITRATE 0.05 MG/ML IJ SOLN
100.0000 ug | INTRAMUSCULAR | Status: DC | PRN
  Administered 2013-11-03: 100 ug via INTRAVENOUS
  Filled 2013-11-03: qty 2

## 2013-11-03 MED ORDER — OXYCODONE-ACETAMINOPHEN 5-325 MG PO TABS: 2.0000 | ORAL_TABLET | ORAL | Status: AC | PRN
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
## 2013-11-03 NOTE — ED Notes (Signed)
Pt A&OX4, ambulatory at d/c with steady gait, NAD 

## 2013-11-03 NOTE — ED Notes (Signed)
Pt reports sudden onset of central CP appx 30 minutes ago. Pt noted to be tearful, anxious and talking in short sentences.

## 2013-11-03 NOTE — ED Notes (Signed)
Pt now appears to be calm, speaking clear complete sentences, has stopped crying and hyperventilating. Pt ambulated to restroom with slow steady gait, NAD.

## 2013-11-03 NOTE — Progress Notes (Signed)
Patient came to the clinic and reported having swelling in both of her sides over the weekend.  She said she could not lay on her back and was having numbness and tingling in her legs.  She said it started to get better last night and is gone today.  Notified Dr. Sondra Come.

## 2013-11-03 NOTE — Discharge Instructions (Signed)
See your doctor, or one of the clinics listed below, as needed, for problems. Try to watch your glucose intake.   Chest Pain (Nonspecific) It is often hard to give a specific diagnosis for the cause of chest pain. There is always a chance that your pain could be related to something serious, such as a heart attack or a blood clot in the lungs. You need to follow up with your health care provider for further evaluation. CAUSES   Heartburn.  Pneumonia or bronchitis.  Anxiety or stress.  Inflammation around your heart (pericarditis) or lung (pleuritis or pleurisy).  A blood clot in the lung.  A collapsed lung (pneumothorax). It can develop suddenly on its own (spontaneous pneumothorax) or from trauma to the chest.  Shingles infection (herpes zoster virus). The chest wall is composed of bones, muscles, and cartilage. Any of these can be the source of the pain.  The bones can be bruised by injury.  The muscles or cartilage can be strained by coughing or overwork.  The cartilage can be affected by inflammation and become sore (costochondritis). DIAGNOSIS  Lab tests or other studies may be needed to find the cause of your pain. Your health care provider may have you take a test called an ambulatory electrocardiogram (ECG). An ECG records your heartbeat patterns over a 24-hour period. You may also have other tests, such as:  Transthoracic echocardiogram (TTE). During echocardiography, sound waves are used to evaluate how blood flows through your heart.  Transesophageal echocardiogram (TEE).  Cardiac monitoring. This allows your health care provider to monitor your heart rate and rhythm in real time.  Holter monitor. This is a portable device that records your heartbeat and can help diagnose heart arrhythmias. It allows your health care provider to track your heart activity for several days, if needed.  Stress tests by exercise or by giving medicine that makes the heart beat  faster. TREATMENT   Treatment depends on what may be causing your chest pain. Treatment may include:  Acid blockers for heartburn.  Anti-inflammatory medicine.  Pain medicine for inflammatory conditions.  Antibiotics if an infection is present.  You may be advised to change lifestyle habits. This includes stopping smoking and avoiding alcohol, caffeine, and chocolate.  You may be advised to keep your head raised (elevated) when sleeping. This reduces the chance of acid going backward from your stomach into your esophagus. Most of the time, nonspecific chest pain will improve within 2-3 days with rest and mild pain medicine.  HOME CARE INSTRUCTIONS   If antibiotics were prescribed, take them as directed. Finish them even if you start to feel better.  For the next few days, avoid physical activities that bring on chest pain. Continue physical activities as directed.  Do not use any tobacco products, including cigarettes, chewing tobacco, or electronic cigarettes.  Avoid drinking alcohol.  Only take medicine as directed by your health care provider.  Follow your health care provider's suggestions for further testing if your chest pain does not go away.  Keep any follow-up appointments you made. If you do not go to an appointment, you could develop lasting (chronic) problems with pain. If there is any problem keeping an appointment, call to reschedule. SEEK MEDICAL CARE IF:   Your chest pain does not go away, even after treatment.  You have a rash with blisters on your chest.  You have a fever. SEEK IMMEDIATE MEDICAL CARE IF:   You have increased chest pain or pain that spreads to  your arm, neck, jaw, back, or abdomen.  You have shortness of breath.  You have an increasing cough, or you cough up blood.  You have severe back or abdominal pain.  You feel nauseous or vomit.  You have severe weakness.  You faint.  You have chills. This is an emergency. Do not wait to  see if the pain will go away. Get medical help at once. Call your local emergency services (911 in U.S.). Do not drive yourself to the hospital. MAKE SURE YOU:   Understand these instructions.  Will watch your condition.  Will get help right away if you are not doing well or get worse. Document Released: 12/07/2004 Document Revised: 03/04/2013 Document Reviewed: 10/03/2007 [facility] Patient Information 2015 [place], [place]. This information is not intended to replace advice given to you by your health care provider. Make sure you discuss any questions you have with your health care provider.  Emergency Department Resource Guide 1) Find a Doctor and Pay Out of Pocket Although you won't have to find out who is covered by your insurance plan, it is a good idea to ask around and get recommendations. You will then need to call the office and see if the doctor you have chosen will accept you as a new patient and what types of options they offer for patients who are self-pay. Some doctors offer discounts or will set up payment plans for their patients who do not have insurance, but you will need to ask so you aren't surprised when you get to your appointment.  2) Contact Your Local Health Department Not all health departments have doctors that can see patients for sick visits, but many do, so it is worth a call to see if yours does. If you don't know where your local health department is, you can check in your phone book. The CDC also has a tool to help you locate your state's health department, and many state websites also have listings of all of their local health departments.  3) Find a [facility] If your illness is not likely to be very severe or complicated, you may want to try a walk in clinic. These are popping up all over the country in pharmacies, drugstores, and shopping centers. They're usually staffed by nurse practitioners or physician assistants that have been trained to treat common  illnesses and complaints. They're usually fairly quick and inexpensive. However, if you have serious medical issues or chronic medical problems, these are probably not your best option.  No Primary Care Doctor: - Call Health Connect at  [phone] - they can help you locate a primary care doctor that  accepts your insurance, provides certain services, etc. - Physician Referral Service- [phone]  Chronic Pain Problems: Organization         Address  Phone   Notes  [facility]  [phone] Patients need to be referred by their primary care doctor.   Medication Assistance: Organization         Address  Phone   Notes  [facility] Medication [facility] [address] [place], [place] [address] [phone] --Must be a resident of [facility] -- Must have NO insurance coverage whatsoever (no Medicaid/ Medicare, etc.) -- The pt. MUST have a primary care doctor that directs their care regularly and follows them in the community   Med[facility]ist  [phone]   [employer]  [phone]    Agencies that provide inexpensive medical care:  Organization         Address  Phone   Notes  [place]  [phone]   [facility]    [phone]   [facility] [place], [place] [address] [phone]   [employer] [address], [place] [phone]   Planned Parenthood    [phone]   [facility]    [phone]   [place] and [place] [address], [place] Phone:  [phone], Fax:  [phone] Hours of Operation:  9 am - 6 pm, M-F.  Also accepts Medicaid/Medicare and self-pay.  [facility] for [place], [address], [place] Phone: [phone], Fax: [phone]. Hours of Operation:  8:30 am - 5:30 pm, M-F.  Also accepts Medicaid and self-pay.  [facility]  [address] [place] Phone: [phone]   [place], [place], [place] [phone], Ext. [phone] Mondays & Thursdays: 7-9 AM.  First 15 patients are seen on a first come, first serve basis.    [place] Providers:  Organization         Address  Phone   Notes  [facility] [address], [place] [phone] Also accepts self-pay patients.  [facility] [address], [place]  [phone]   [place], [address], [place] [phone]   [facility] [address] [place] [phone]   Lucianne Lei [address], [place]   [phone] Only accepts [place] Access [place] patients after they have their name applied to their card.   Self-Pay (no insurance) in [facility]:  Organization         Address  Phone   Notes  Sickle Cell Patients, [facility] [place] [phone]   [facility] [place] [phone]   [facility] [place]  [place], [place], [place] [phone]   [facility]/Dr. Osei-Bonsu  [address], [place] or [address], [place] [phone] Phone number for both [place] and [place] locations is the same.  [facility] [address], [place] [phone]   [facility] [address], [place] or [address] [phone]   [facility] [address], [place] [phone], phone; [phone], fax Sees patients 1st and 3rd Saturday of every month.  Must not qualify for public or private insurance (i.e. Medicaid, Medicare, [place] Health Choice, Veterans' Benefits)  Household income should be no more than 200% of the  poverty level The clinic cannot treat you if you are pregnant or think you are pregnant  Sexually transmitted diseases are not treated at the clinic.    Dental Care: Organization         Address  Phone  Notes  [facility] [phone] Accepts children up to age 28 who are enrolled in [place] or [place]; pregnant women with a Medicaid card; and children who have applied for Medicaid or [place] Health Choice, but were declined,  whose parents can pay a reduced fee at time of service.  [facility] Department of [facility]  [address], [place] [phone] Accepts children up to age 25 who are enrolled in [place] or [place]; pregnant women with a Medicaid card; and children who have applied for Medicaid or [place] Health Choice, but were declined, whose parents can pay a reduced fee at time of service.  [place] Adult [facility] Access PROGRAM  [place] [phone] Patients are seen by appointment only. Walk-ins are not accepted. [place] will see patients 71 years of age and older. Monday - Tuesday (8am-5pm) Most Wednesdays (8:30-5pm) $30 per visit, cash only  [facility] Access [facility]  [address], [facility] [phone] Patients are seen by appointment only. Walk-ins are not accepted. [place] will see patients 4 years of age and older. One Wednesday Evening (Monthly: Volunteer Based).  $30 per visit, cash only  [place]  [phone] for adults; Children under age 29, call Graduate Pediatric Dentistry at [phone]. Children aged 29-14, please call [phone] to request a pediatric application.  Dental services are provided in all areas of dental care including fillings, crowns and bridges, complete and partial dentures, implants, gum treatment, root canals, and extractions.  Preventive care is also provided. Treatment is provided to both adults and children. Patients are selected via a lottery and there is often a waiting list.   [facility] [address], [place]  [phone] [email or web address]   [facility] [address] [place], [place] [phone], Ext. [phone] Second and Fourth Thursday of each month, opens at 6:30 AM; Clinic ends at 9 AM.  Patients are seen on a first-come first-served basis, and a limited number are seen during each clinic.   [facility]  [address] Hillard Danker [place], [place] [phone]   Eligibility Requirements You must have lived in [place], [place], or [place] counties for at least the last three months.   You cannot be eligible for state or federal sponsored [employer], including [employer], [place], or [employer].   You generally cannot be eligible for healthcare insurance through your employer.    How to apply: Eligibility screenings are held every Tuesday and Wednesday afternoon from 1:00 pm until 4:00 pm. You do not need an appointment for the interview!  [facility] [address] [place], [place]   [place]  What [place] Department  [place]  [phone]    Behavioral Health Resources in the Community: Intensive Outpatient Programs Organization         Address  Phone  Notes  [place]. [address], [place], [place] [phone]   [facility] Outpatient [address], [place], [place]   ADS: Alcohol & Drug Svcs [address] [place], [place]   [place] [address]  [place], [place] or [phone]   Substance Abuse Resources Organization         Address  Phone  Notes  Alcohol and Drug Services  [phone]   [place]  [phone]   [facility] [place]   Chinita Pester  [phone]   Residential & Outpatient Substance Abuse Program  9200685070   Psychological Services Organization  Address  Phone  Notes  [place]  [place]  [phone]   [place] [address] [place] or [phone]    Mobile Crisis Teams Organization         Address  Phone  Notes  [facility]atives, Mobile Crisis Care Unit  [phone]   [facility]  [address] [place], [place]   [place] [address], [place] [phone]    Self-Help/Support Groups Organization         Address  Phone             Notes  [place]. of [place] - variety of support groups  [place] Call for more information  Narcotics Anonymous (NA), [facility] [address], [employer] [place]  2 meetings at this location   Special educational needs teacher         Address  Phone  Notes  ASAP Residential Treatment [place],    [place]  [phone]   [facility]  [address] [place] 824235, [place], [place]   [place], [place] [phone] Admissions: 8am-3pm M-F  Incentives Substance [place] [address],    [place], [place] [phone]   [facility] [address] [place], [place], [place]   [facility] [address],  [place], [place]   Insight Programs - Intensive Outpatient [address], [place], [place]   [facility] ([place].,  [place], [place] [phone] or [phone]   [facility]idential Treatment Services (RTS) [address] [place], [place] Accepts Medicaid  [facility] [place] [address]  [place]  [phone])[phone] Substance Abuse/Addiction Treatment   [facility] Organization         Address  Phone  Notes  [facility]  [phone]   Domenic Schwab, PhD [address] [place], [place]   [phone] or [phone]   [facility], [place] [phone]   [facility] [address] [place], [place] [phone] Insurance/Medicaid/sponsorship through [facility] and Families [address] [place]                                    [place], [place] [phone] [place] [address], [place] [phone]    Dr. Adele Schilder  [phone]   [facility] [place] Dept. 1) [address], [place] 2) [place] 3)  [address], [place] [phone]  [phone]   [place] [phone] or [phone] (After Hours)
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
## 2013-11-04 ENCOUNTER — Ambulatory Visit: Payer: Self-pay

## 2013-11-04 ENCOUNTER — Ambulatory Visit: Payer: Self-pay | Admitting: Radiation Oncology

## 2013-11-04 ENCOUNTER — Encounter: Payer: Self-pay | Admitting: Radiation Oncology

## 2013-11-04 ENCOUNTER — Ambulatory Visit: Admission: RE | Admit: 2013-11-04 | Payer: Self-pay | Source: Ambulatory Visit | Admitting: Radiation Oncology

## 2013-11-04 NOTE — Progress Notes (Signed)
   Department of Radiation Oncology  Phone:  [phone] Fax:        [phone]  Progress note  Earlier today we received a call from the patient. She has decided to stop her radiation therapy. This evening I spoke with the patient about the importance of completing her radiation therapy in one location. She appears to understand this but in light of her social situation she will be moving to either the [place] or [place]   area tomorrow. When she determines her residence we will  assist in coordination of her care so that she will not have a significant break in her radiation treatment.   -----------------------------------  Blair Promise, PhD, MD

## 2013-11-05 ENCOUNTER — Ambulatory Visit: Payer: Self-pay

## 2013-11-06 ENCOUNTER — Ambulatory Visit: Payer: Self-pay

## 2013-11-07 ENCOUNTER — Ambulatory Visit: Payer: Self-pay

## 2013-11-10 ENCOUNTER — Telehealth: Payer: Self-pay

## 2013-11-10 ENCOUNTER — Telehealth: Payer: Self-pay | Admitting: Cardiovascular Disease

## 2013-11-10 ENCOUNTER — Ambulatory Visit: Payer: Self-pay

## 2013-11-10 NOTE — Telephone Encounter (Signed)
Returned call to patient she stated she has moved to [place] and would like to be referred to cardiologist associated with [facility] she lives near [place].Message sent to [facility].

## 2013-11-10 NOTE — Telephone Encounter (Signed)
Patient has moved to [place], [place] and would like a referral to a cardiologist there.

## 2013-11-10 NOTE — Telephone Encounter (Signed)
Left a message for Ms. Rosario-Rivera stating that Dr. Nancy Marus was to make a referral for her.  Told her to call Joylene John NP with Dr. [name]. Notified Joylene John NP of patient's relocation and suggestion of calling  Ms. Cross.

## 2013-11-10 NOTE — Telephone Encounter (Signed)
Kelly [name] has relocated to [place] and was call regarding a referral to a GYN Oncologist there.

## 2013-11-11 ENCOUNTER — Ambulatory Visit: Payer: Self-pay

## 2013-11-11 NOTE — Telephone Encounter (Signed)
Relocation acknowledged. [place] [facility] [place] but [facility] can contact [facility] upon arrival for referral.

## 2013-11-11 NOTE — Telephone Encounter (Signed)
Left message  Per DR. Claiborne Billings he does not know a MD @ [place]. She can contact [facility] for referral.

## 2013-11-12 ENCOUNTER — Ambulatory Visit: Payer: Self-pay

## 2013-11-13 ENCOUNTER — Encounter: Payer: Self-pay | Admitting: Radiation Oncology

## 2013-11-13 ENCOUNTER — Telehealth: Payer: Self-pay | Admitting: *Deleted

## 2013-11-13 ENCOUNTER — Ambulatory Visit: Payer: Self-pay

## 2013-11-13 NOTE — Progress Notes (Signed)
  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 She had some intermittent nausea and loose stools with her pelvic radiation therapy.   Plan: the patient completed an abbreviated course of radiation therapy as part of management of her stage IIIa endometrial cancer. She was originally scheduled to receive 45 gray to the pelvis followed by intracavitary brachytherapy treatments directed at the proximal vagina. In light of the patient's social situation she elected to stop her therapy despite a strong recommendation for continuation to complete her radiation therapy. The patient will continue her adjuvant treatment in [facility]. She has established contact with an oncologist in this area. I have encouraged patient to resume her radiation therapy as quickly as possible given the break in treatment. Appropriate records will be forwarded for continuation of the patient's adjuvant treatment and to expedite her restarting treatment.  [place] [address]  [place] Fax  [phone]  -----------------------------------  Blair Promise, PhD, MD

## 2013-11-13 NOTE — Telephone Encounter (Signed)
Attempted to call patient several times - phone directly to voicemail each time. Left voicemail for patient asking her to call us back and let us know where she would like her medical records sent since she states she has already moved.

## 2013-11-14 ENCOUNTER — Ambulatory Visit: Payer: Self-pay

## 2013-11-18 ENCOUNTER — Ambulatory Visit: Payer: Self-pay

## 2013-11-18 ENCOUNTER — Telehealth: Payer: Self-pay | Admitting: *Deleted

## 2013-11-18 NOTE — Telephone Encounter (Signed)
Patient's medical records faxed today to Dr. Romie Minus attn: Jinny Blossom. Patient's appointment with Dr. Romie Minus is scheduled for 11/24/13 at 8:15am.

## 2013-11-19 ENCOUNTER — Ambulatory Visit: Payer: Self-pay

## 2013-11-19 ENCOUNTER — Telehealth: Payer: Self-pay | Admitting: Oncology

## 2013-11-19 NOTE — Telephone Encounter (Signed)
Faxed pt medical records to [facility] [phone]

## 2013-11-20 ENCOUNTER — Ambulatory Visit: Payer: Self-pay

## 2013-11-21 ENCOUNTER — Ambulatory Visit: Payer: Self-pay

## 2013-11-24 ENCOUNTER — Telehealth: Payer: Self-pay | Admitting: Radiation Oncology

## 2013-11-24 ENCOUNTER — Telehealth: Payer: Self-pay | Admitting: Oncology

## 2013-11-24 NOTE — Telephone Encounter (Signed)
Spoke with Audie Box [phone], f. [phone]) with [facility]iation [facility] to receive medical records. A CD was burned, isodose and other medical records were overnighted via [employer] [phone]. Candida Peeling confirmed that she did receive the overnighted material and will share with the GYN office [phone]).

## 2013-11-24 NOTE — Telephone Encounter (Signed)
Medical Oncology  Phone call from Dr Yancey Flemings, gyn oncology with [facility] in [place], who is assuming patient's care as she has relocated there. Patient was hospitalized by cardiology after presenting to their ED, off medications then, ruled out for acute cardiac event.  We reviewed the complicated course in [place]. Dr Romie Minus is working with radiation oncology there to resume treatment as quickly as possible. Dr Romie Minus does not have radiation oncology information, so will ask [facility] radiation oncology to fax this to her now at [phone] (Phone for RN [phone]).  Godfrey Pick, MD

## 2013-12-16 ENCOUNTER — Telehealth: Payer: Self-pay | Admitting: Internal Medicine

## 2013-12-16 NOTE — Telephone Encounter (Signed)
Faxed over medical records to [facility] @ [phone]

## 2013-12-17 ENCOUNTER — Telehealth: Payer: Self-pay | Admitting: Medical

## 2013-12-17 NOTE — Telephone Encounter (Signed)
I believe she has moved out of state.  Please verify this?  Also, she needs to make sure someone is managing her diabetes as well.  She hasn't been here since 06/2013.   Please see the form I received.

## 2013-12-19 NOTE — Telephone Encounter (Signed)
I tried to call pt twice but unable to leave a message.

## 2014-02-19 ENCOUNTER — Encounter (HOSPITAL_COMMUNITY): Payer: Self-pay | Admitting: Cardiovascular Disease

## 2014-07-30 ENCOUNTER — Telehealth: Payer: Self-pay | Admitting: *Deleted

## 2014-07-30 NOTE — Telephone Encounter (Signed)
TC from patient to give message to Dr. [name]. Pt. resides in [place], [place] and her cancer is now in remission. She wanted to share that with Dr. [name]  Pt's # is [phone]

## 2014-12-07 IMAGING — CT CT ANGIO CHEST
1 of 2 series · 19 of 32 positions shown · IV contrast (OMNIPAQUE 300)
Comparison: CT abdomen/pelvis 03/27/2013

CLINICAL DATA: Chest pain and shortness of breath. History of stage
III endometrial cancer. Currently on chemotherapy.

EXAM:
CT ANGIOGRAPHY CHEST WITH CONTRAST
TECHNIQUE: Multidetector CT imaging of the chest was performed using the
standard protocol during bolus administration of intravenous
contrast. Multiplanar CT image reconstructions and MIPs were
obtained to evaluate the vascular anatomy.
CONTRAST:  100mL OMNIPAQUE IOHEXOL 350 MG/ML SOLN

[Series 10: thins for pacs · axial · 0.64mm/px · z∈[+1753,+1970]mm · 19 of 243 slices shown]
[im 13/243  lung]
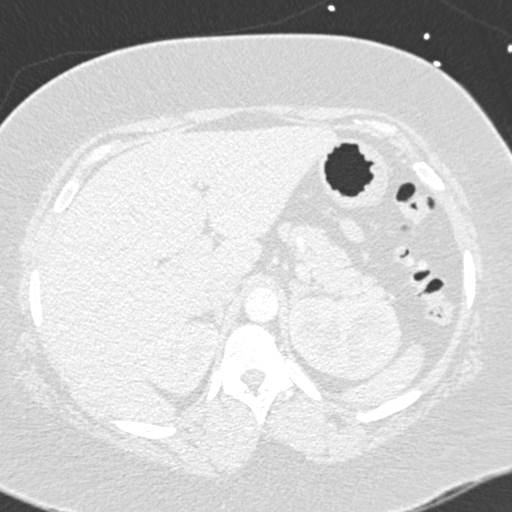
[im 25/243  mediastinal]
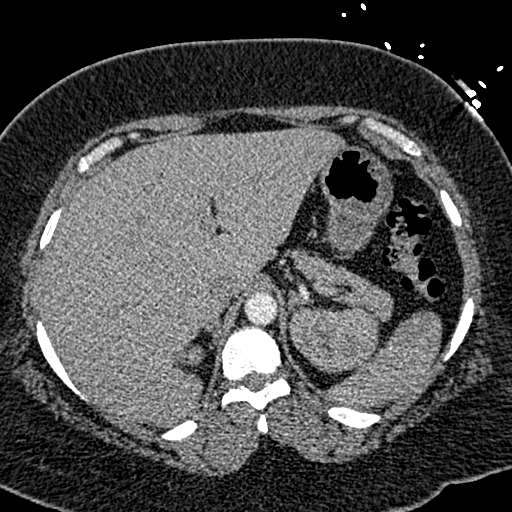
[im 37/243  lung]
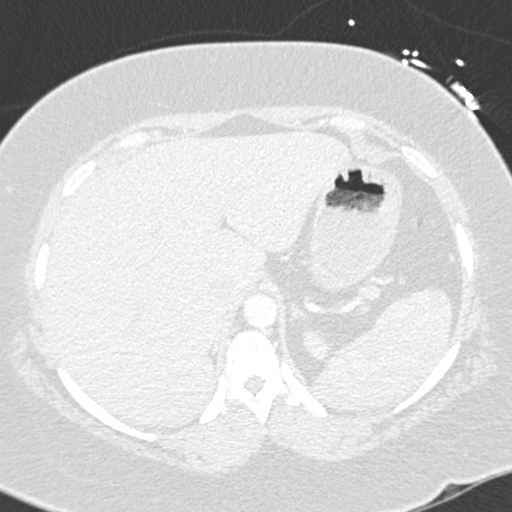
[im 61/243  mediastinal]
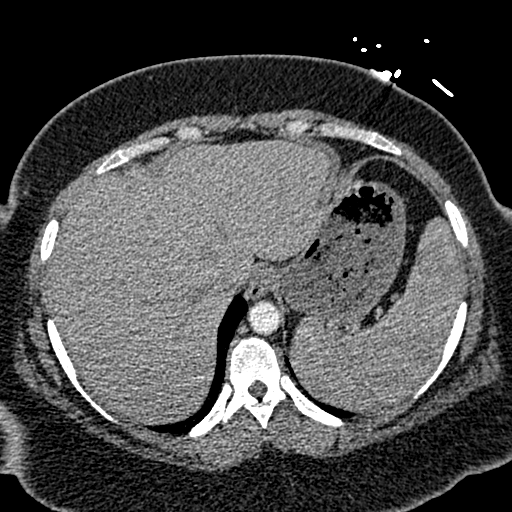
[im 73/243  lung]
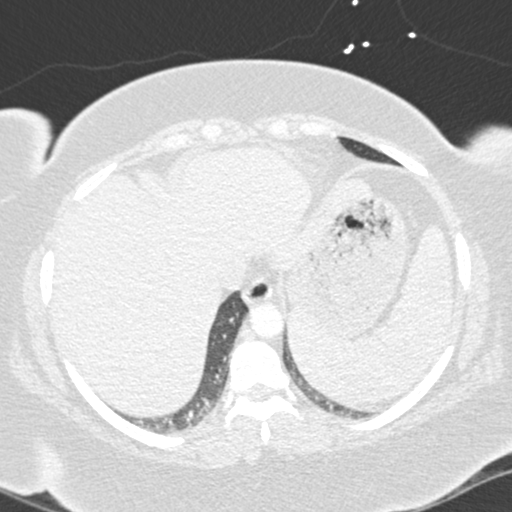
[im 81/243  mediastinal]
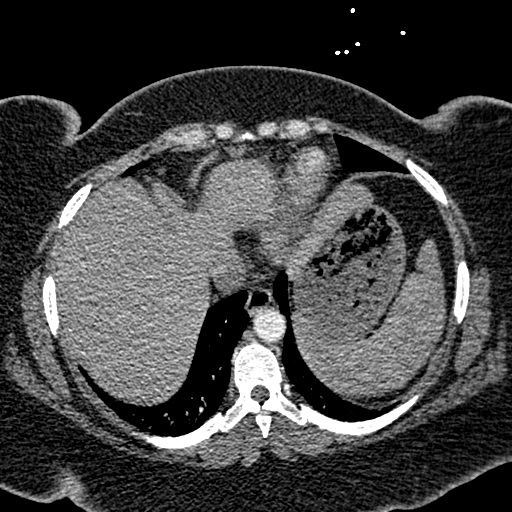
[im 85/243  lung]
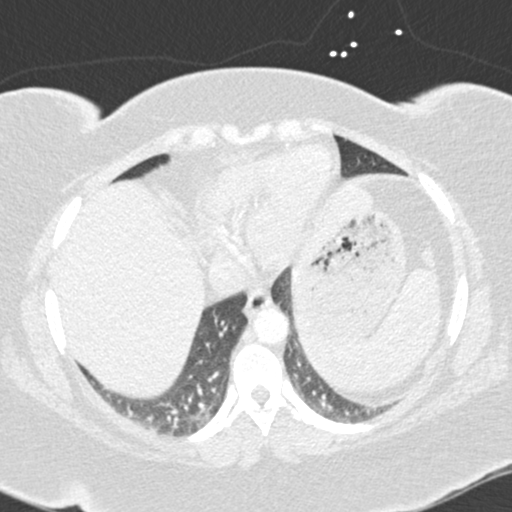
[im 97/243  mediastinal]
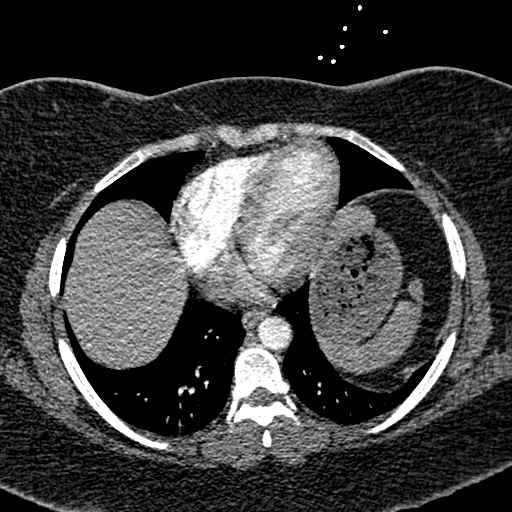
[im 109/243  lung]
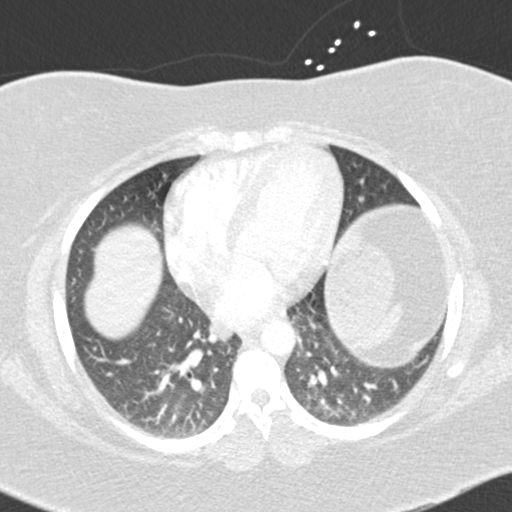
[im 122/243  mediastinal]
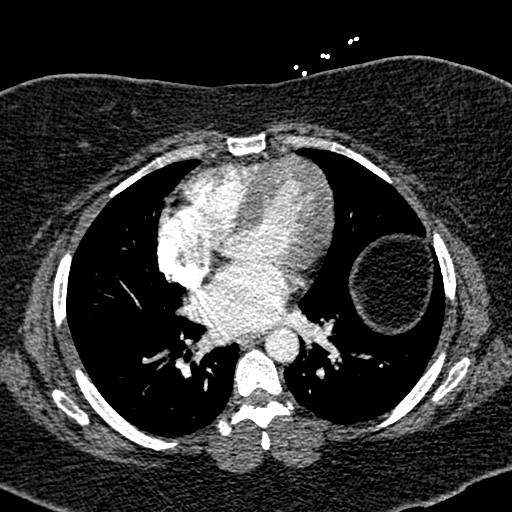
[im 134/243  lung]
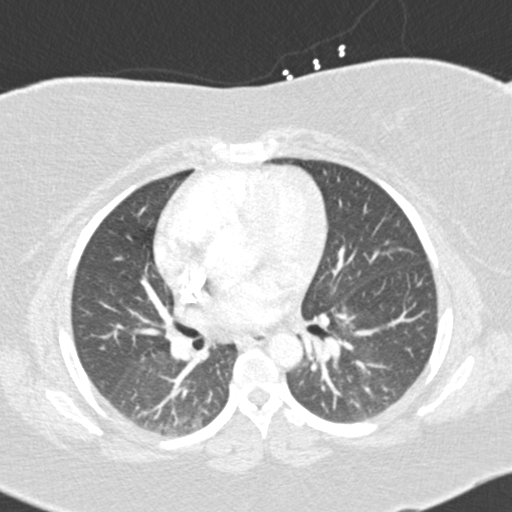
[im 146/243  mediastinal]
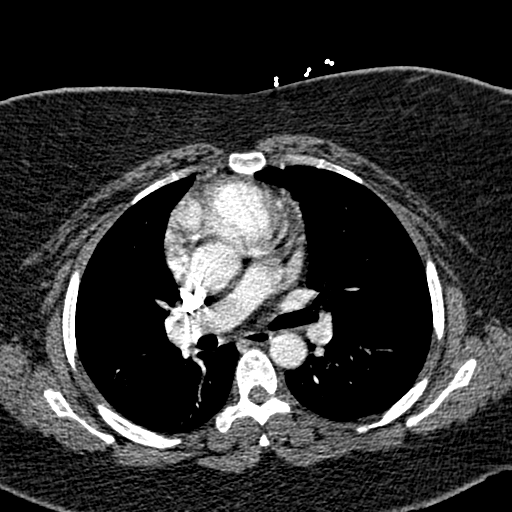
[im 158/243  lung]
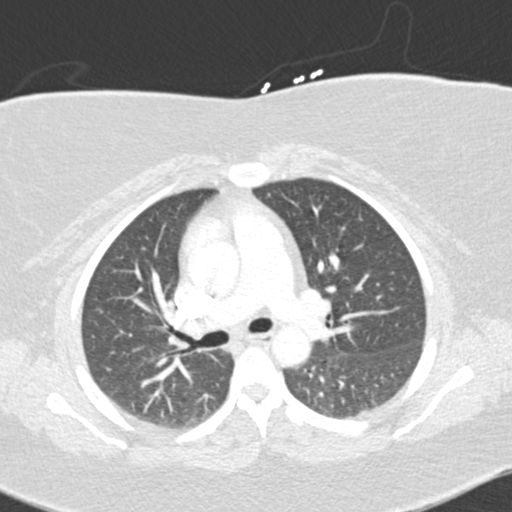
[im 162/243  mediastinal]
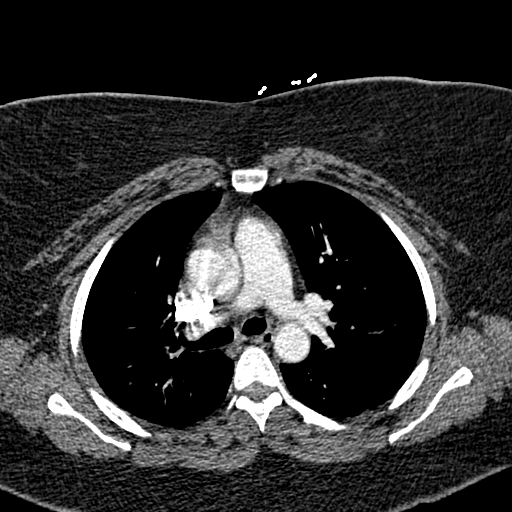
[im 170/243  lung]
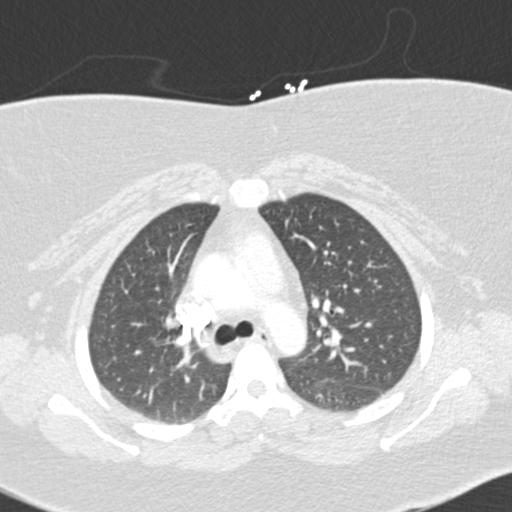
[im 182/243  mediastinal]
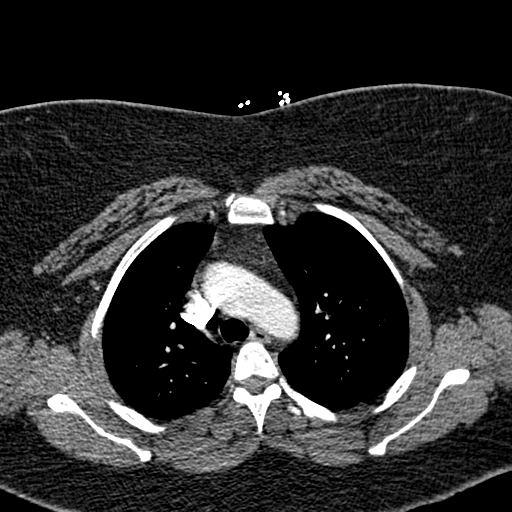
[im 206/243  lung]
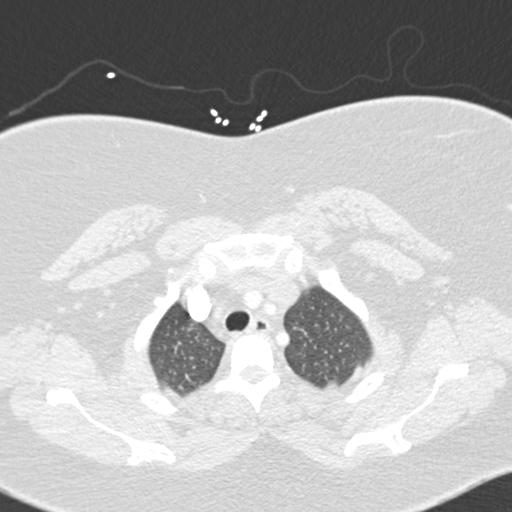
[im 218/243  mediastinal]
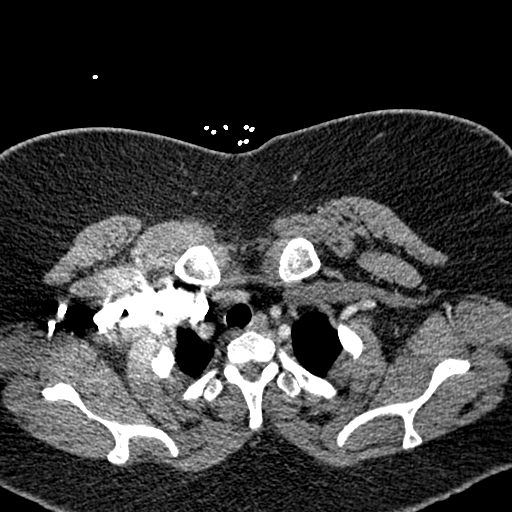
[im 230/243  lung]
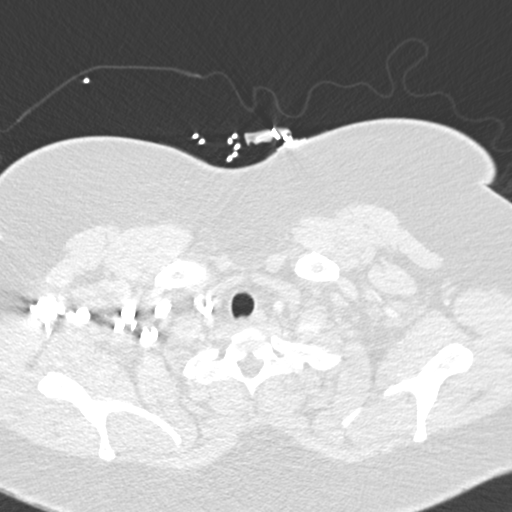

[19 of 32 positions shown; findings below may reference images not displayed]

FINDINGS: Lungs are adequately inflated with minimal nodular opacification
over the medial left lower lobe. Heart size of normal. There is no
evidence of pulmonary embolism. Remaining mediastinal structures are
within normal. There is no evidence of hilar, mediastinal or
axillary adenopathy.

Images through the upper abdomen are notable only for mild
diverticulosis of the colon. Remaining bones a soft tissues are
within normal.

Review of the MIP images confirms the above findings.
IMPRESSION: No evidence of pulmonary embolism.

Minimal nonspecific nodular opacification over the medial left lower
lobe which may be due to infection, inflammatory process/ drug
toxicity or metastatic disease. Consider followup CT in 4-6 weeks.

## 2014-12-12 IMAGING — CR DG CHEST 1V PORT
1 series · 1 of 1 positions shown · non-contrast
Comparison: 06/18/2013

CLINICAL DATA: Chest pain.  Attempted right arm PICC placement.

EXAM:
PORTABLE CHEST - 1 VIEW

[AP]
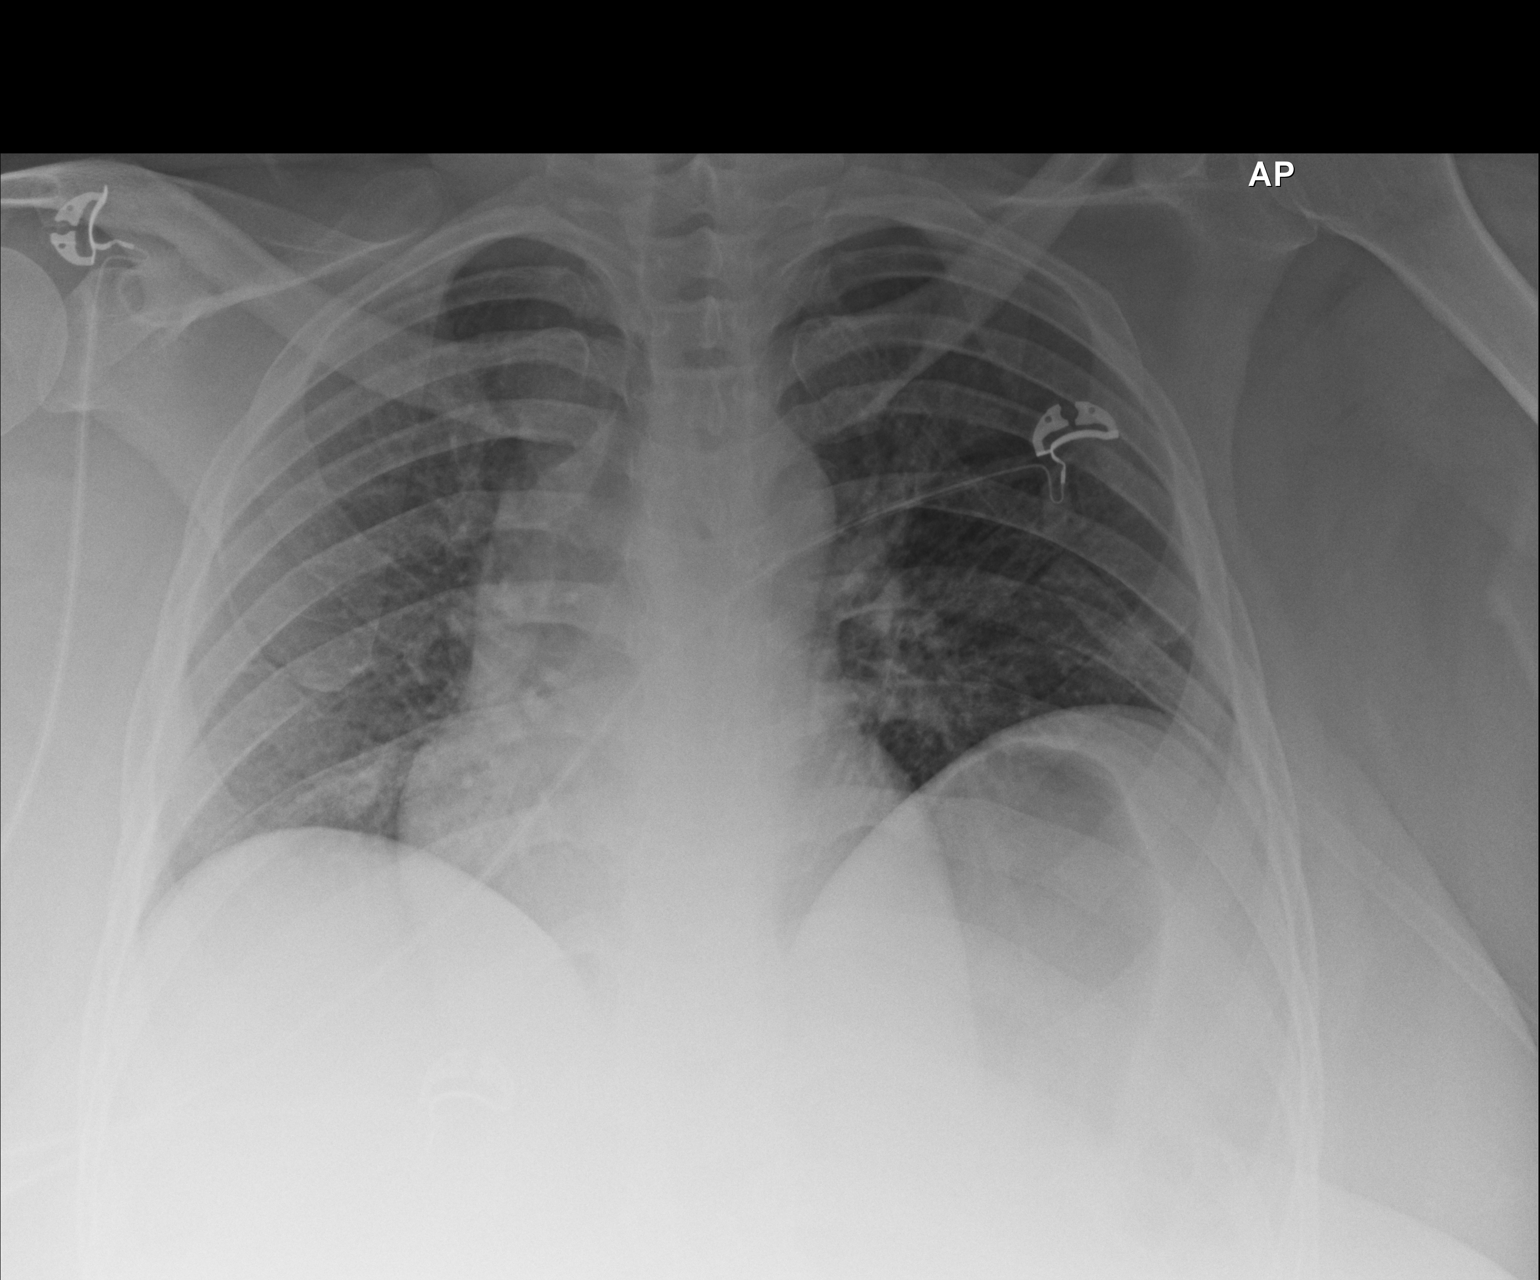

[1 of 1 positions shown; findings below may reference images not displayed]

FINDINGS: Heart size is normal. Mediastinal shadows are normal. Lungs are
clear. No effusions. No bony abnormalities. Poor inspiration.
IMPRESSION: Poor inspiration.  No active disease.

## 2015-04-15 IMAGING — CT CT ANGIO CHEST
2 of 8 series · 19 of 46 positions shown · IV contrast (Omni 300)
Comparison: Chest radiograph October 22, 2013 and CT angiogram of
the chest July 20, 2013

CLINICAL DATA: Chest pain.  History of endometrial cancer.

EXAM:
CT ANGIOGRAPHY CHEST WITH CONTRAST
TECHNIQUE: Multidetector CT imaging of the chest was performed using the
standard protocol during bolus administration of intravenous
contrast. Multiplanar CT image reconstructions and MIPs were
obtained to evaluate the vascular anatomy.
CONTRAST:  100mL OMNIPAQUE IOHEXOL 350 MG/ML SOLN

[Series 5: thins · axial · 0.58mm/px · z∈[+1193,+1406]mm · 16 of 235 slices shown]
[im 11/235  lung]
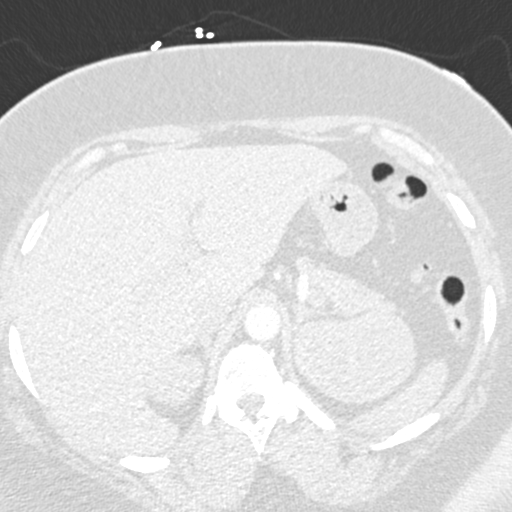
[im 22/235  soft-tissue]
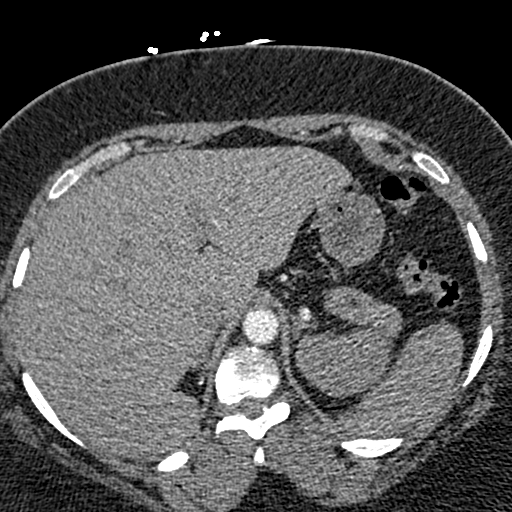
[im 43/235  lung]
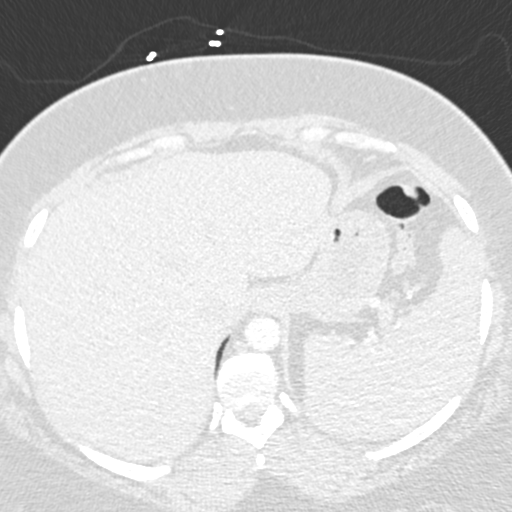
[im 54/235  soft-tissue]
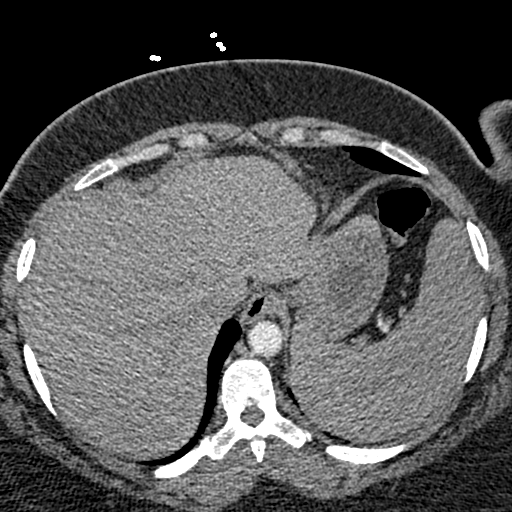
[im 64/235  lung]
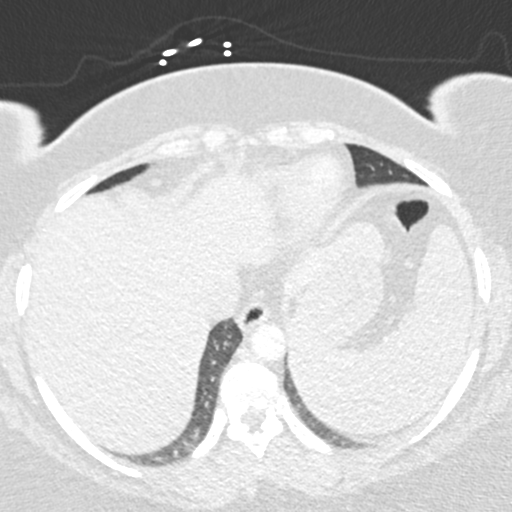
[im 86/235  soft-tissue]
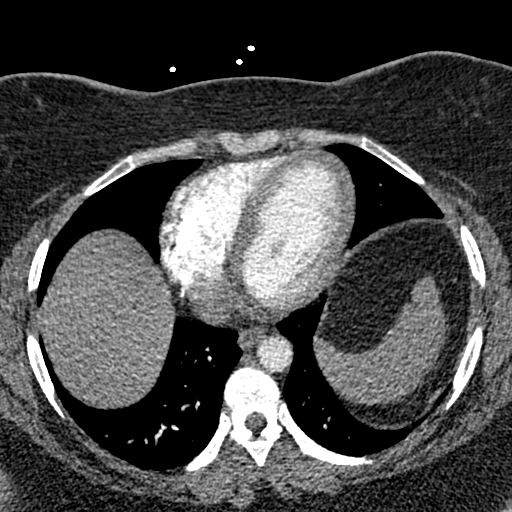
[im 96/235  lung]
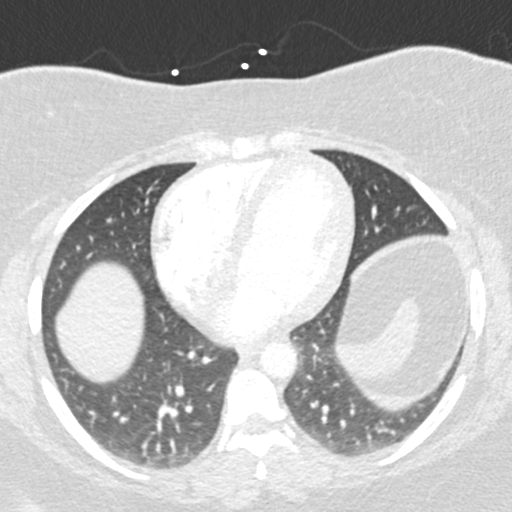
[im 107/235  soft-tissue]
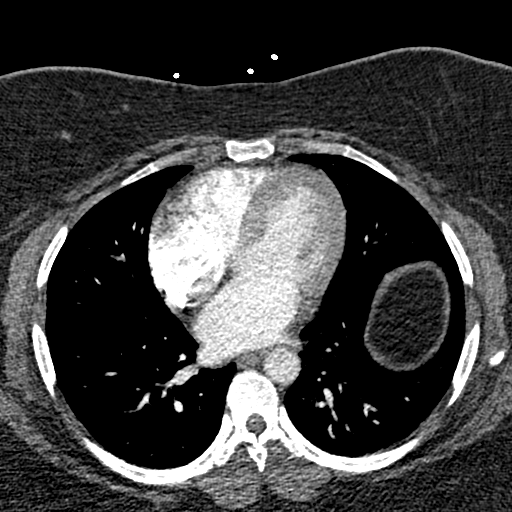
[im 128/235  lung]
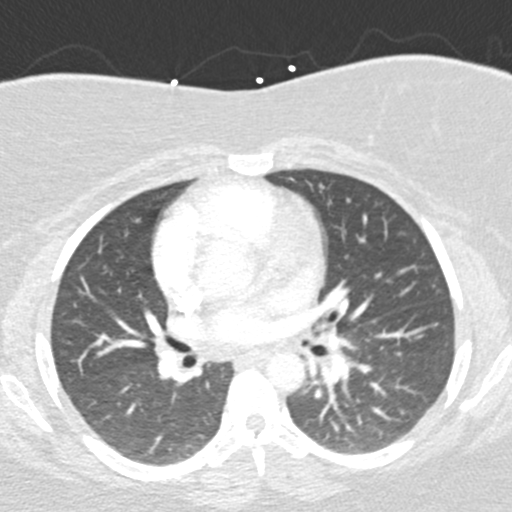
[im 139/235  soft-tissue]
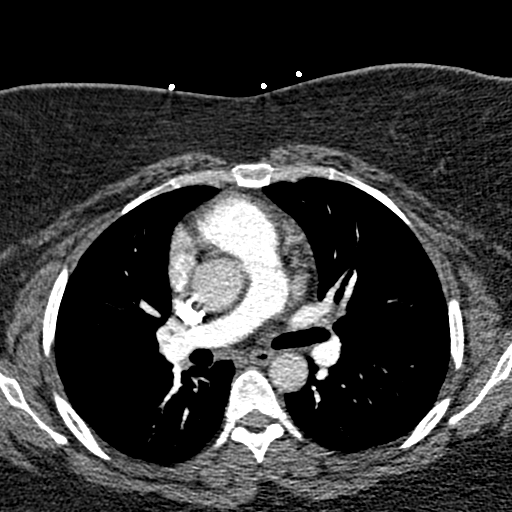
[im 149/235  lung]
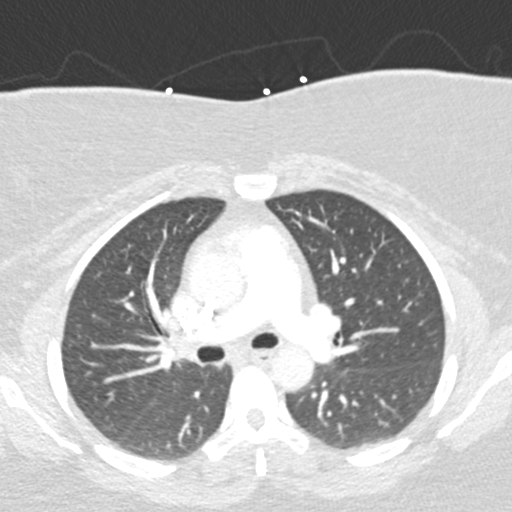
[im 171/235  soft-tissue]
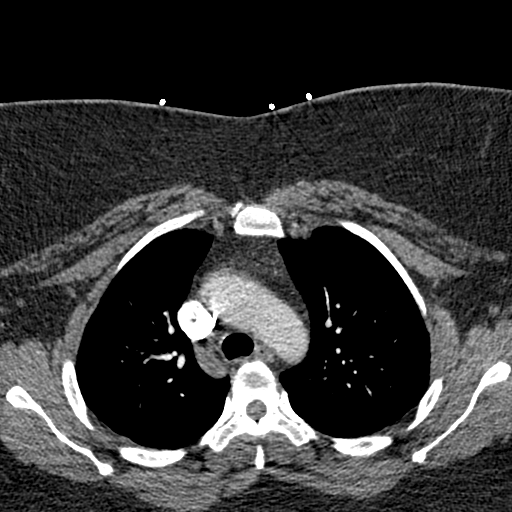
[im 181/235  lung]
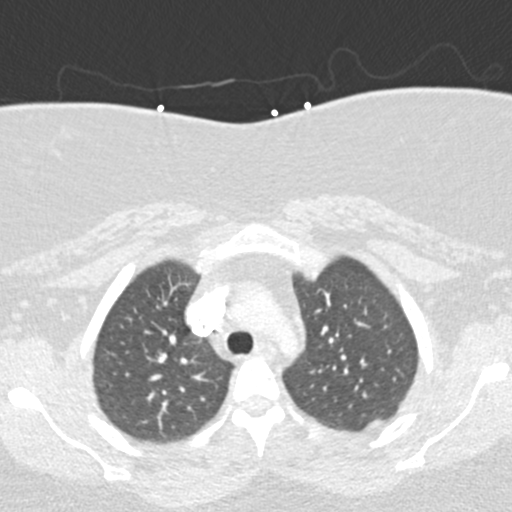
[im 192/235  soft-tissue]
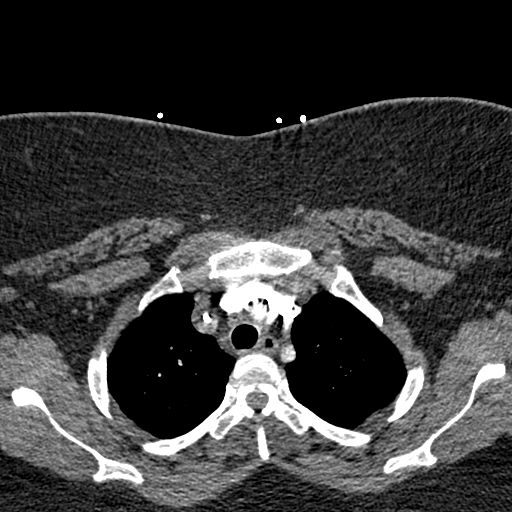
[im 213/235  lung]
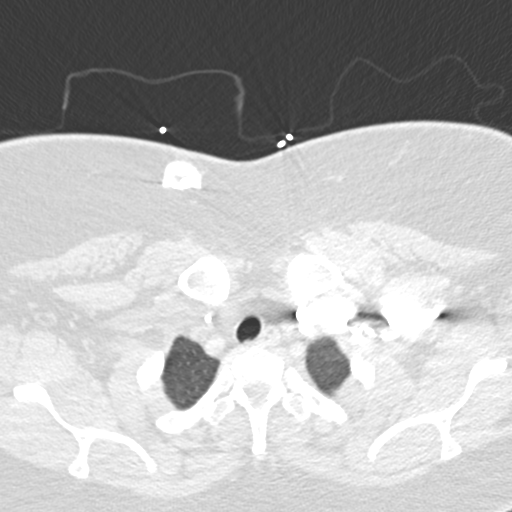
[im 224/235  soft-tissue]
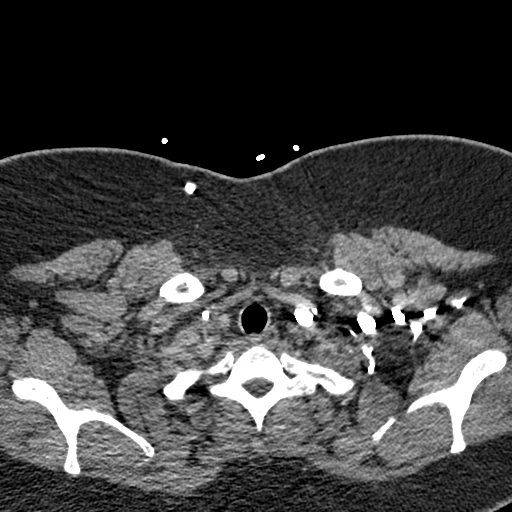

[Series 7: coronal mpr · coronal · 0.59mm/px · 3 of 124 slices shown]
[im 31/124  soft-tissue]
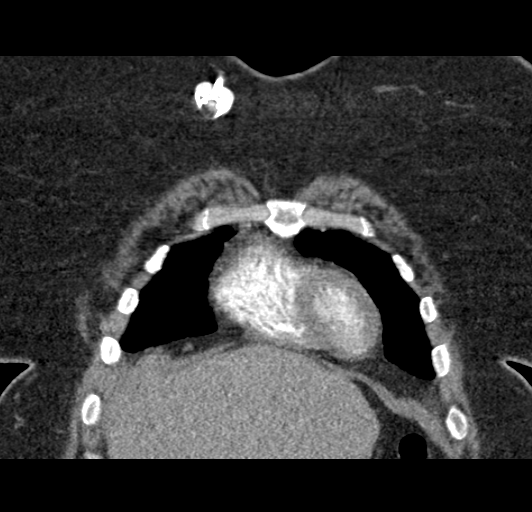
[im 62/124  soft-tissue]
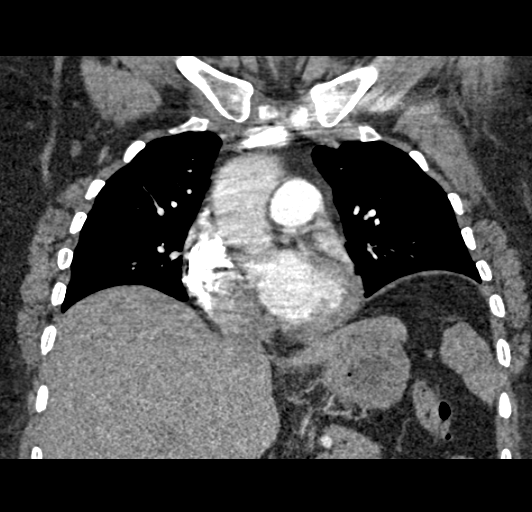
[im 93/124  soft-tissue]
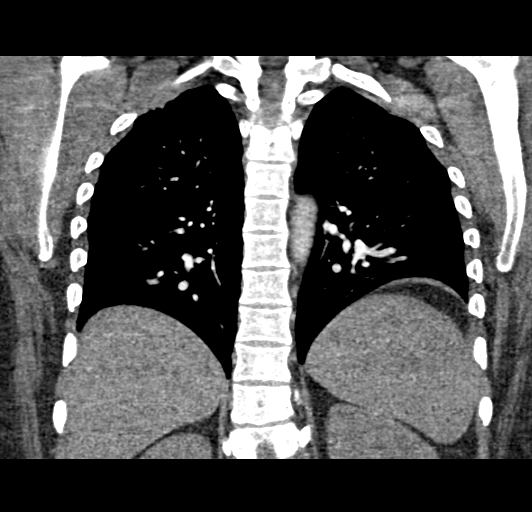

[19 of 46 positions shown; findings below may reference images not displayed]

FINDINGS: Adequate contrast opacification of the pulmonary artery's. Main
pulmonary artery is not enlarged. No pulmonary arterial filling
defects to the level of the subsegmental branches.

Heart and pericardium are unremarkable, no right heart strain.
Thoracic aorta is normal course and caliber, unremarkable. No
lymphadenopathy by CT size criteria. Tracheobronchial tree is
patent, no pneumothorax. No pleural effusions, focal consolidations,
pulmonary nodules or masses.

Included view of the abdomen is unremarkable. Right single-lumen
chest Port-A-Cath with distal tip in mid superior vena cava.

Review of the MIP images confirms the above findings.
IMPRESSION: No pulmonary embolism nor acute cardiopulmonary process.

  By: Lkw Tiger

## 2016-03-22 ENCOUNTER — Other Ambulatory Visit: Payer: Self-pay | Admitting: Nurse Practitioner
